# Patient Record
Sex: Female | Born: 1935 | Race: White | Hispanic: No | Smoking: Never smoker
Health system: Southern US, Community
[De-identification: ages and names within clinical notes are randomized; demographics above are authoritative.]

## PROBLEM LIST (undated history)

## (undated) DIAGNOSIS — M1990 Unspecified osteoarthritis, unspecified site: Secondary | ICD-10-CM

## (undated) DIAGNOSIS — K5790 Diverticulosis of intestine, part unspecified, without perforation or abscess without bleeding: Secondary | ICD-10-CM

## (undated) DIAGNOSIS — I10 Essential (primary) hypertension: Secondary | ICD-10-CM

## (undated) DIAGNOSIS — E119 Type 2 diabetes mellitus without complications: Secondary | ICD-10-CM

## (undated) DIAGNOSIS — J8410 Pulmonary fibrosis, unspecified: Secondary | ICD-10-CM

## (undated) HISTORY — DX: Unspecified osteoarthritis, unspecified site: M19.90

## (undated) HISTORY — DX: Diverticulosis of intestine, part unspecified, without perforation or abscess without bleeding: K57.90

## (undated) HISTORY — PX: HUMERUS FRACTURE SURGERY: SHX670

## (undated) HISTORY — PX: ABDOMINAL HYSTERECTOMY: SHX81

## (undated) HISTORY — PX: LAMINECTOMY: SHX219

## (undated) HISTORY — DX: Pulmonary fibrosis, unspecified: J84.10

## (undated) HISTORY — DX: Essential (primary) hypertension: I10

## (undated) HISTORY — DX: Type 2 diabetes mellitus without complications: E11.9

## (undated) HISTORY — PX: REPLACEMENT TOTAL KNEE: SUR1224

---

## 1994-05-19 ENCOUNTER — Encounter: Payer: Self-pay | Admitting: Internal Medicine

## 2002-05-16 DIAGNOSIS — J8410 Pulmonary fibrosis, unspecified: Secondary | ICD-10-CM | POA: Insufficient documentation

## 2004-04-19 ENCOUNTER — Ambulatory Visit: Payer: Self-pay | Admitting: Internal Medicine

## 2004-07-19 ENCOUNTER — Ambulatory Visit: Payer: Self-pay | Admitting: Internal Medicine

## 2004-08-26 ENCOUNTER — Ambulatory Visit: Payer: Self-pay | Admitting: Internal Medicine

## 2004-10-19 ENCOUNTER — Ambulatory Visit: Payer: Self-pay | Admitting: Internal Medicine

## 2004-12-21 ENCOUNTER — Ambulatory Visit: Payer: Self-pay | Admitting: Internal Medicine

## 2005-04-26 ENCOUNTER — Ambulatory Visit: Payer: Self-pay | Admitting: Internal Medicine

## 2005-08-25 ENCOUNTER — Ambulatory Visit: Payer: Self-pay | Admitting: Internal Medicine

## 2005-10-17 ENCOUNTER — Ambulatory Visit: Payer: Self-pay | Admitting: Family Medicine

## 2005-10-24 ENCOUNTER — Ambulatory Visit: Payer: Self-pay | Admitting: Internal Medicine

## 2005-11-30 ENCOUNTER — Inpatient Hospital Stay: Payer: Self-pay | Admitting: Specialist

## 2005-12-06 ENCOUNTER — Ambulatory Visit: Payer: Self-pay | Admitting: Internal Medicine

## 2005-12-21 ENCOUNTER — Ambulatory Visit: Payer: Self-pay | Admitting: Internal Medicine

## 2006-02-02 ENCOUNTER — Ambulatory Visit: Payer: Self-pay | Admitting: Internal Medicine

## 2006-06-06 ENCOUNTER — Ambulatory Visit: Payer: Self-pay | Admitting: Internal Medicine

## 2006-06-06 LAB — CONVERTED CEMR LAB
BUN: 33 mg/dL — ABNORMAL HIGH (ref 6–23)
CO2: 28 meq/L (ref 19–32)
Calcium: 9.5 mg/dL (ref 8.4–10.5)
Creatinine,U: 114 mg/dL
GFR calc Af Amer: 55 mL/min
Microalb, Ur: 4.6 mg/dL — ABNORMAL HIGH (ref 0.0–1.9)
Potassium: 4.2 meq/L (ref 3.5–5.1)

## 2006-09-12 ENCOUNTER — Encounter: Payer: Self-pay | Admitting: Internal Medicine

## 2006-10-11 DIAGNOSIS — K5730 Diverticulosis of large intestine without perforation or abscess without bleeding: Secondary | ICD-10-CM | POA: Insufficient documentation

## 2006-10-11 DIAGNOSIS — I10 Essential (primary) hypertension: Secondary | ICD-10-CM | POA: Insufficient documentation

## 2006-10-11 DIAGNOSIS — R32 Unspecified urinary incontinence: Secondary | ICD-10-CM | POA: Insufficient documentation

## 2006-10-12 ENCOUNTER — Ambulatory Visit: Payer: Self-pay | Admitting: Internal Medicine

## 2006-10-12 DIAGNOSIS — M1990 Unspecified osteoarthritis, unspecified site: Secondary | ICD-10-CM | POA: Insufficient documentation

## 2006-10-12 DIAGNOSIS — E1129 Type 2 diabetes mellitus with other diabetic kidney complication: Secondary | ICD-10-CM

## 2006-11-09 ENCOUNTER — Encounter: Payer: Self-pay | Admitting: Internal Medicine

## 2006-11-23 ENCOUNTER — Ambulatory Visit: Payer: Self-pay | Admitting: Internal Medicine

## 2006-11-23 LAB — CONVERTED CEMR LAB
Albumin: 3.8 g/dL (ref 3.5–5.2)
CO2: 29 meq/L (ref 19–32)
Creatinine, Ser: 1.3 mg/dL — ABNORMAL HIGH (ref 0.4–1.2)
Phosphorus: 4.3 mg/dL (ref 2.3–4.6)
Sodium: 138 meq/L (ref 135–145)

## 2007-01-05 ENCOUNTER — Ambulatory Visit: Payer: Self-pay | Admitting: Internal Medicine

## 2007-04-04 ENCOUNTER — Ambulatory Visit: Payer: Self-pay | Admitting: Family Medicine

## 2007-04-04 DIAGNOSIS — M79609 Pain in unspecified limb: Secondary | ICD-10-CM

## 2007-04-06 ENCOUNTER — Ambulatory Visit: Payer: Self-pay | Admitting: Internal Medicine

## 2007-04-16 ENCOUNTER — Telehealth: Payer: Self-pay | Admitting: Internal Medicine

## 2007-04-18 ENCOUNTER — Encounter: Payer: Self-pay | Admitting: Internal Medicine

## 2007-04-19 ENCOUNTER — Telehealth (INDEPENDENT_AMBULATORY_CARE_PROVIDER_SITE_OTHER): Payer: Self-pay | Admitting: *Deleted

## 2007-04-23 ENCOUNTER — Telehealth (INDEPENDENT_AMBULATORY_CARE_PROVIDER_SITE_OTHER): Payer: Self-pay | Admitting: *Deleted

## 2007-04-24 ENCOUNTER — Telehealth (INDEPENDENT_AMBULATORY_CARE_PROVIDER_SITE_OTHER): Payer: Self-pay | Admitting: *Deleted

## 2007-04-25 ENCOUNTER — Encounter: Payer: Self-pay | Admitting: Internal Medicine

## 2007-05-03 ENCOUNTER — Ambulatory Visit: Payer: Self-pay | Admitting: Internal Medicine

## 2007-05-03 DIAGNOSIS — M48061 Spinal stenosis, lumbar region without neurogenic claudication: Secondary | ICD-10-CM

## 2007-05-04 ENCOUNTER — Encounter: Payer: Self-pay | Admitting: Internal Medicine

## 2007-05-07 LAB — CONVERTED CEMR LAB
ALT: 25 units/L (ref 0–35)
AST: 28 units/L (ref 0–37)
Albumin: 3.9 g/dL (ref 3.5–5.2)
BUN: 33 mg/dL — ABNORMAL HIGH (ref 6–23)
Basophils Absolute: 0 10*3/uL (ref 0.0–0.1)
CO2: 28 meq/L (ref 19–32)
Chloride: 101 meq/L (ref 96–112)
Cholesterol: 168 mg/dL (ref 0–200)
Creatinine, Ser: 1.4 mg/dL — ABNORMAL HIGH (ref 0.4–1.2)
Creatinine,U: 181.3 mg/dL
HDL: 36.3 mg/dL — ABNORMAL LOW (ref >39.0)
Hgb A1c MFr Bld: 6.3 % — ABNORMAL HIGH (ref 4.6–6.0)
LDL Cholesterol: 95 mg/dL (ref 0–99)
MCHC: 35.1 g/dL (ref 30.0–36.0)
Microalb Creat Ratio: 25.4 mg/g (ref 0.0–30.0)
Monocytes Absolute: 0.5 10*3/uL (ref 0.2–0.7)
Monocytes Relative: 7.3 % (ref 3.0–11.0)
Phosphorus: 3.3 mg/dL (ref 2.3–4.6)
RBC: 3.67 M/uL — ABNORMAL LOW (ref 3.87–5.11)
RDW: 12.2 % (ref 11.5–14.6)
TSH: 2.07 microintl units/mL (ref 0.35–5.50)
Total CHOL/HDL Ratio: 4.6
Triglycerides: 185 mg/dL — ABNORMAL HIGH (ref 0–149)

## 2007-05-08 ENCOUNTER — Encounter (INDEPENDENT_AMBULATORY_CARE_PROVIDER_SITE_OTHER): Payer: Self-pay | Admitting: Internal Medicine

## 2007-05-09 ENCOUNTER — Encounter: Payer: Self-pay | Admitting: Internal Medicine

## 2007-05-14 ENCOUNTER — Ambulatory Visit: Payer: Self-pay | Admitting: Internal Medicine

## 2007-05-15 ENCOUNTER — Inpatient Hospital Stay (HOSPITAL_COMMUNITY): Admission: RE | Admit: 2007-05-15 | Discharge: 2007-05-18 | Payer: Self-pay | Admitting: Neurosurgery

## 2007-05-16 ENCOUNTER — Encounter (INDEPENDENT_AMBULATORY_CARE_PROVIDER_SITE_OTHER): Payer: Self-pay | Admitting: Neurosurgery

## 2007-05-16 ENCOUNTER — Ambulatory Visit: Payer: Self-pay | Admitting: Vascular Surgery

## 2007-05-25 ENCOUNTER — Encounter: Payer: Self-pay | Admitting: Internal Medicine

## 2007-07-19 ENCOUNTER — Ambulatory Visit: Payer: Self-pay | Admitting: Family Medicine

## 2007-08-09 ENCOUNTER — Ambulatory Visit: Payer: Self-pay | Admitting: Internal Medicine

## 2007-08-09 ENCOUNTER — Telehealth (INDEPENDENT_AMBULATORY_CARE_PROVIDER_SITE_OTHER): Payer: Self-pay | Admitting: *Deleted

## 2007-08-17 ENCOUNTER — Ambulatory Visit: Payer: Self-pay | Admitting: Internal Medicine

## 2007-08-21 ENCOUNTER — Ambulatory Visit: Payer: Self-pay | Admitting: Pulmonary Disease

## 2007-08-21 ENCOUNTER — Ambulatory Visit: Payer: Self-pay | Admitting: Cardiology

## 2007-09-06 ENCOUNTER — Ambulatory Visit: Payer: Self-pay | Admitting: Internal Medicine

## 2007-09-11 ENCOUNTER — Ambulatory Visit: Payer: Self-pay | Admitting: Pulmonary Disease

## 2007-09-13 ENCOUNTER — Telehealth: Payer: Self-pay | Admitting: Pulmonary Disease

## 2007-11-07 ENCOUNTER — Encounter: Payer: Self-pay | Admitting: Internal Medicine

## 2007-11-29 ENCOUNTER — Encounter: Payer: Self-pay | Admitting: Internal Medicine

## 2007-12-10 ENCOUNTER — Ambulatory Visit: Payer: Self-pay | Admitting: Pulmonary Disease

## 2007-12-21 ENCOUNTER — Ambulatory Visit: Payer: Self-pay | Admitting: Internal Medicine

## 2007-12-21 DIAGNOSIS — R011 Cardiac murmur, unspecified: Secondary | ICD-10-CM

## 2007-12-25 LAB — CONVERTED CEMR LAB
ALT: 18 U/L
AST: 23 U/L
Albumin: 3.8 g/dL
Alkaline Phosphatase: 68 U/L
BUN: 38 mg/dL — ABNORMAL HIGH
Basophils Absolute: 0 K/uL
Basophils Relative: 0.6 %
Bilirubin, Direct: 0.1 mg/dL
CO2: 30 meq/L
Calcium: 9.5 mg/dL
Chloride: 105 meq/L
Creatinine, Ser: 1.2 mg/dL
Creatinine,U: 105 mg/dL
Eosinophils Absolute: 0.2 K/uL
Eosinophils Relative: 3.2 %
GFR calc Af Amer: 55 mL/min
GFR calc non Af Amer: 46 mL/min
Glucose, Bld: 111 mg/dL — ABNORMAL HIGH
HCT: 33.3 % — ABNORMAL LOW
Hemoglobin: 11.2 g/dL — ABNORMAL LOW
Hgb A1c MFr Bld: 6.7 % — ABNORMAL HIGH
Lymphocytes Relative: 19.1 %
MCHC: 33.8 g/dL
MCV: 88 fL
Microalb Creat Ratio: 8.6 mg/g
Microalb, Ur: 0.9 mg/dL
Monocytes Absolute: 0.5 K/uL
Monocytes Relative: 8 %
Neutro Abs: 4.3 K/uL
Neutrophils Relative %: 69.1 %
Phosphorus: 3.9 mg/dL
Platelets: 225 K/uL
Potassium: 4.8 meq/L
RBC: 3.78 M/uL — ABNORMAL LOW
RDW: 13.3 %
Sodium: 140 meq/L
TSH: 2.58 u[IU]/mL
Total Bilirubin: 0.6 mg/dL
Total Protein: 7 g/dL
WBC: 6.2 10*3/microliter

## 2007-12-31 ENCOUNTER — Telehealth: Payer: Self-pay | Admitting: Internal Medicine

## 2008-02-08 ENCOUNTER — Encounter: Payer: Self-pay | Admitting: Internal Medicine

## 2008-03-20 ENCOUNTER — Telehealth: Payer: Self-pay | Admitting: Internal Medicine

## 2008-04-14 ENCOUNTER — Ambulatory Visit: Payer: Self-pay | Admitting: Cardiovascular Disease

## 2008-04-16 ENCOUNTER — Ambulatory Visit: Payer: Self-pay | Admitting: Pulmonary Disease

## 2008-04-28 ENCOUNTER — Telehealth: Payer: Self-pay | Admitting: Internal Medicine

## 2008-05-15 ENCOUNTER — Telehealth: Payer: Self-pay | Admitting: Pulmonary Disease

## 2008-05-20 ENCOUNTER — Telehealth (INDEPENDENT_AMBULATORY_CARE_PROVIDER_SITE_OTHER): Payer: Self-pay | Admitting: *Deleted

## 2008-06-16 ENCOUNTER — Ambulatory Visit: Payer: Self-pay | Admitting: Pulmonary Disease

## 2008-06-26 ENCOUNTER — Ambulatory Visit: Payer: Self-pay | Admitting: Internal Medicine

## 2008-06-30 ENCOUNTER — Telehealth: Payer: Self-pay | Admitting: Internal Medicine

## 2008-06-30 ENCOUNTER — Telehealth: Payer: Self-pay | Admitting: Pulmonary Disease

## 2008-06-30 LAB — CONVERTED CEMR LAB
Basophils Absolute: 0.2 10*3/uL — ABNORMAL HIGH (ref 0.0–0.1)
Basophils Relative: 1.6 % (ref 0.0–3.0)
CO2: 29 meq/L (ref 19–32)
Chloride: 103 meq/L (ref 96–112)
Creatinine, Ser: 1.3 mg/dL — ABNORMAL HIGH (ref 0.4–1.2)
Eosinophils Absolute: 0.1 10*3/uL (ref 0.0–0.7)
Eosinophils Relative: 0.4 % (ref 0.0–5.0)
GFR calc Af Amer: 50 mL/min
Hgb A1c MFr Bld: 6.6 % — ABNORMAL HIGH (ref 4.6–6.0)
Lymphocytes Relative: 8.3 % — ABNORMAL LOW (ref 12.0–46.0)
MCHC: 33.4 g/dL (ref 30.0–36.0)
MCV: 92.1 fL (ref 78.0–100.0)
Neutrophils Relative %: 86.7 % — ABNORMAL HIGH (ref 43.0–77.0)
Phosphorus: 4 mg/dL (ref 2.3–4.6)
Platelets: 251 10*3/uL (ref 150–400)
Potassium: 5.4 meq/L — ABNORMAL HIGH (ref 3.5–5.1)
RBC: 3.99 M/uL (ref 3.87–5.11)
TSH: 1.73 microintl units/mL (ref 0.35–5.50)
WBC: 12.8 10*3/uL — ABNORMAL HIGH (ref 4.5–10.5)

## 2008-07-22 ENCOUNTER — Ambulatory Visit: Payer: Self-pay | Admitting: Internal Medicine

## 2008-07-22 ENCOUNTER — Telehealth: Payer: Self-pay | Admitting: Internal Medicine

## 2008-07-24 LAB — CONVERTED CEMR LAB
BUN: 32 mg/dL — ABNORMAL HIGH (ref 6–23)
CO2: 29 meq/L (ref 19–32)
Calcium: 9 mg/dL (ref 8.4–10.5)
Chloride: 101 meq/L (ref 96–112)
Eosinophils Relative: 1.4 % (ref 0.0–5.0)
GFR calc non Af Amer: 38 mL/min
Glucose, Bld: 189 mg/dL — ABNORMAL HIGH (ref 70–99)
HCT: 33.9 % — ABNORMAL LOW (ref 36.0–46.0)
Lymphocytes Relative: 28.4 % (ref 12.0–46.0)
Monocytes Relative: 7 % (ref 3.0–12.0)
Neutrophils Relative %: 62.4 % (ref 43.0–77.0)
Platelets: 239 10*3/uL (ref 150–400)
Potassium: 4.2 meq/L (ref 3.5–5.1)
RDW: 14.7 % — ABNORMAL HIGH (ref 11.5–14.6)
Sodium: 139 meq/L (ref 135–145)
WBC: 9.7 10*3/uL (ref 4.5–10.5)

## 2008-07-29 ENCOUNTER — Ambulatory Visit: Payer: Self-pay | Admitting: Pulmonary Disease

## 2008-08-22 ENCOUNTER — Ambulatory Visit: Payer: Self-pay | Admitting: Gastroenterology

## 2008-09-18 ENCOUNTER — Telehealth: Payer: Self-pay | Admitting: Gastroenterology

## 2008-09-18 ENCOUNTER — Ambulatory Visit: Payer: Self-pay | Admitting: Pulmonary Disease

## 2008-09-26 ENCOUNTER — Telehealth: Payer: Self-pay | Admitting: Gastroenterology

## 2008-10-03 ENCOUNTER — Encounter: Payer: Self-pay | Admitting: Gastroenterology

## 2008-10-03 ENCOUNTER — Ambulatory Visit: Payer: Self-pay | Admitting: Gastroenterology

## 2008-10-03 LAB — HM COLONOSCOPY

## 2008-10-07 ENCOUNTER — Encounter: Payer: Self-pay | Admitting: Gastroenterology

## 2008-11-07 ENCOUNTER — Encounter: Payer: Self-pay | Admitting: Internal Medicine

## 2008-11-27 ENCOUNTER — Encounter: Payer: Self-pay | Admitting: Internal Medicine

## 2008-12-05 ENCOUNTER — Telehealth: Payer: Self-pay | Admitting: Pulmonary Disease

## 2008-12-09 ENCOUNTER — Ambulatory Visit: Payer: Self-pay | Admitting: Internal Medicine

## 2008-12-09 ENCOUNTER — Telehealth: Payer: Self-pay | Admitting: Internal Medicine

## 2008-12-10 LAB — CONVERTED CEMR LAB
ALT: 16 units/L (ref 0–35)
AST: 20 units/L (ref 0–37)
BUN: 31 mg/dL — ABNORMAL HIGH (ref 6–23)
Basophils Relative: 0.5 % (ref 0.0–3.0)
Bilirubin, Direct: 0 mg/dL (ref 0.0–0.3)
CO2: 29 meq/L (ref 19–32)
Chloride: 104 meq/L (ref 96–112)
Eosinophils Relative: 1.6 % (ref 0.0–5.0)
HCT: 32.5 % — ABNORMAL LOW (ref 36.0–46.0)
Hgb A1c MFr Bld: 7.9 % — ABNORMAL HIGH (ref 4.6–6.5)
Lymphs Abs: 1.6 10*3/uL (ref 0.7–4.0)
Monocytes Relative: 3.4 % (ref 3.0–12.0)
Phosphorus: 3.5 mg/dL (ref 2.3–4.6)
Platelets: 260 10*3/uL (ref 150.0–400.0)
Potassium: 4 meq/L (ref 3.5–5.1)
RBC: 3.51 M/uL — ABNORMAL LOW (ref 3.87–5.11)
Sodium: 142 meq/L (ref 135–145)
Total Bilirubin: 0.6 mg/dL (ref 0.3–1.2)
Total Protein: 6.5 g/dL (ref 6.0–8.3)
WBC: 14.2 10*3/uL — ABNORMAL HIGH (ref 4.5–10.5)

## 2008-12-11 ENCOUNTER — Telehealth (INDEPENDENT_AMBULATORY_CARE_PROVIDER_SITE_OTHER): Payer: Self-pay | Admitting: *Deleted

## 2009-01-08 ENCOUNTER — Ambulatory Visit: Payer: Self-pay | Admitting: Pulmonary Disease

## 2009-01-09 ENCOUNTER — Telehealth: Payer: Self-pay | Admitting: Pulmonary Disease

## 2009-02-17 ENCOUNTER — Ambulatory Visit: Payer: Self-pay | Admitting: Pulmonary Disease

## 2009-05-07 ENCOUNTER — Ambulatory Visit: Payer: Self-pay | Admitting: Pulmonary Disease

## 2009-05-14 ENCOUNTER — Ambulatory Visit: Payer: Self-pay | Admitting: Internal Medicine

## 2009-05-14 DIAGNOSIS — F419 Anxiety disorder, unspecified: Secondary | ICD-10-CM | POA: Insufficient documentation

## 2009-05-19 LAB — CONVERTED CEMR LAB
AST: 18 units/L (ref 0–37)
Albumin: 4.2 g/dL (ref 3.5–5.2)
Alkaline Phosphatase: 48 units/L (ref 39–117)
Basophils Absolute: 0 10*3/uL (ref 0.0–0.1)
Lymphocytes Relative: 12 % (ref 12–46)
Neutro Abs: 8.2 10*3/uL — ABNORMAL HIGH (ref 1.7–7.7)
Neutrophils Relative %: 84 % — ABNORMAL HIGH (ref 43–77)
Platelets: 257 10*3/uL (ref 150–400)
Potassium: 5 meq/L (ref 3.5–5.3)
RDW: 14.5 % (ref 11.5–15.5)
Sodium: 140 meq/L (ref 135–145)
Total Bilirubin: 0.4 mg/dL (ref 0.3–1.2)
Total Protein: 6.8 g/dL (ref 6.0–8.3)

## 2009-07-20 ENCOUNTER — Telehealth: Payer: Self-pay | Admitting: Internal Medicine

## 2009-07-22 ENCOUNTER — Ambulatory Visit: Payer: Self-pay | Admitting: Family Medicine

## 2009-07-23 ENCOUNTER — Telehealth: Payer: Self-pay | Admitting: Family Medicine

## 2009-07-23 ENCOUNTER — Ambulatory Visit: Payer: Self-pay | Admitting: Family Medicine

## 2009-07-23 LAB — CONVERTED CEMR LAB
Albumin: 3.2 g/dL — ABNORMAL LOW (ref 3.5–5.2)
Alkaline Phosphatase: 72 units/L (ref 39–117)
Basophils Absolute: 0.1 10*3/uL (ref 0.0–0.1)
Basophils Relative: 0.6 % (ref 0.0–3.0)
Bilirubin Urine: NEGATIVE
CO2: 31 meq/L (ref 19–32)
Calcium: 9.7 mg/dL (ref 8.4–10.5)
Chloride: 103 meq/L (ref 96–112)
Creatinine, Ser: 1.5 mg/dL — ABNORMAL HIGH (ref 0.4–1.2)
Eosinophils Absolute: 0.2 10*3/uL (ref 0.0–0.7)
Glucose, Bld: 125 mg/dL — ABNORMAL HIGH (ref 70–99)
Glucose, Urine, Semiquant: NEGATIVE
Hemoglobin: 10.3 g/dL — ABNORMAL LOW (ref 12.0–15.0)
Ketones, urine, test strip: NEGATIVE
Lymphocytes Relative: 12.7 % (ref 12.0–46.0)
MCHC: 33 g/dL (ref 30.0–36.0)
MCV: 93.9 fL (ref 78.0–100.0)
Monocytes Absolute: 0.9 10*3/uL (ref 0.1–1.0)
Neutro Abs: 7.4 10*3/uL (ref 1.4–7.7)
RDW: 12.7 % (ref 11.5–14.6)
Sodium: 141 meq/L (ref 135–145)
Specific Gravity, Urine: 1.015
Total Protein: 6.9 g/dL (ref 6.0–8.3)
pH: 5

## 2009-07-27 ENCOUNTER — Telehealth: Payer: Self-pay | Admitting: Family Medicine

## 2009-08-04 ENCOUNTER — Ambulatory Visit: Payer: Self-pay | Admitting: Internal Medicine

## 2009-08-04 LAB — CONVERTED CEMR LAB
Bilirubin Urine: NEGATIVE
Glucose, Urine, Semiquant: NEGATIVE
Ketones, urine, test strip: NEGATIVE
Protein, U semiquant: 30
Urobilinogen, UA: 0.2
pH: 7

## 2009-08-05 ENCOUNTER — Ambulatory Visit: Payer: Self-pay | Admitting: Pulmonary Disease

## 2009-08-05 ENCOUNTER — Encounter: Payer: Self-pay | Admitting: Pulmonary Disease

## 2009-09-29 ENCOUNTER — Encounter: Payer: Self-pay | Admitting: Internal Medicine

## 2009-09-29 ENCOUNTER — Telehealth (INDEPENDENT_AMBULATORY_CARE_PROVIDER_SITE_OTHER): Payer: Self-pay | Admitting: *Deleted

## 2009-10-23 ENCOUNTER — Ambulatory Visit: Payer: Self-pay | Admitting: Internal Medicine

## 2009-10-26 LAB — CONVERTED CEMR LAB
ALT: 27 units/L (ref 0–35)
AST: 33 units/L (ref 0–37)
Basophils Relative: 0.3 % (ref 0.0–3.0)
Bilirubin, Direct: 0.1 mg/dL (ref 0.0–0.3)
CO2: 28 meq/L (ref 19–32)
Calcium: 9.2 mg/dL (ref 8.4–10.5)
Creatinine, Ser: 1.3 mg/dL — ABNORMAL HIGH (ref 0.4–1.2)
Eosinophils Relative: 0 % (ref 0.0–5.0)
GFR calc non Af Amer: 42.89 mL/min (ref >60)
Glucose, Bld: 110 mg/dL — ABNORMAL HIGH (ref 70–99)
HCT: 29.8 % — ABNORMAL LOW (ref 36.0–46.0)
Monocytes Relative: 5.8 % (ref 3.0–12.0)
Neutrophils Relative %: 33.4 % — ABNORMAL LOW (ref 43.0–77.0)
Platelets: 141 10*3/uL — ABNORMAL LOW (ref 150.0–400.0)
Potassium: 5 meq/L (ref 3.5–5.1)
RBC: 3.41 M/uL — ABNORMAL LOW (ref 3.87–5.11)
Sodium: 143 meq/L (ref 135–145)
Total Bilirubin: 0.3 mg/dL (ref 0.3–1.2)
Total Protein: 6.8 g/dL (ref 6.0–8.3)
WBC: 6.1 10*3/uL (ref 4.5–10.5)

## 2009-10-28 ENCOUNTER — Encounter: Payer: Self-pay | Admitting: Internal Medicine

## 2009-11-04 ENCOUNTER — Ambulatory Visit: Payer: Self-pay | Admitting: Pulmonary Disease

## 2009-11-19 ENCOUNTER — Encounter: Payer: Self-pay | Admitting: Internal Medicine

## 2009-11-19 LAB — HM DIABETES EYE EXAM

## 2009-12-02 ENCOUNTER — Telehealth: Payer: Self-pay | Admitting: Internal Medicine

## 2009-12-11 ENCOUNTER — Telehealth (INDEPENDENT_AMBULATORY_CARE_PROVIDER_SITE_OTHER): Payer: Self-pay | Admitting: *Deleted

## 2010-01-27 ENCOUNTER — Telehealth: Payer: Self-pay | Admitting: Internal Medicine

## 2010-01-27 ENCOUNTER — Encounter: Payer: Self-pay | Admitting: Internal Medicine

## 2010-03-01 ENCOUNTER — Ambulatory Visit: Payer: Self-pay | Admitting: Pulmonary Disease

## 2010-04-19 ENCOUNTER — Ambulatory Visit: Payer: Self-pay | Admitting: Internal Medicine

## 2010-04-19 LAB — HM DIABETES FOOT EXAM

## 2010-04-23 LAB — CONVERTED CEMR LAB
Basophils Relative: 0.6 % (ref 0.0–3.0)
CO2: 28 meq/L (ref 19–32)
Calcium: 9.6 mg/dL (ref 8.4–10.5)
Chloride: 101 meq/L (ref 96–112)
Eosinophils Absolute: 0.3 10*3/uL (ref 0.0–0.7)
Eosinophils Relative: 4.3 % (ref 0.0–5.0)
GFR calc non Af Amer: 43.64 mL/min — ABNORMAL LOW (ref >60.00)
Glucose, Bld: 176 mg/dL — ABNORMAL HIGH (ref 70–99)
Lymphocytes Relative: 20.6 % (ref 12.0–46.0)
Neutrophils Relative %: 68.9 % (ref 43.0–77.0)
Phosphorus: 3.3 mg/dL (ref 2.3–4.6)
Platelets: 243 10*3/uL (ref 150.0–400.0)
RBC: 3.91 M/uL (ref 3.87–5.11)
WBC: 7.8 10*3/uL (ref 4.5–10.5)

## 2010-05-03 ENCOUNTER — Ambulatory Visit: Payer: Self-pay | Admitting: Pulmonary Disease

## 2010-06-15 NOTE — Progress Notes (Signed)
Summary: form for diabetic supplies  Phone Note From Pharmacy   Caller: [employer] Summary of Call: Form for diabetic supplies is on your desk. Initial call taken by: Lowella Petties CMA,  January 27, 2010 12:33 PM  Follow-up for Phone Call        form done Follow-up by: Cindee Salt MD,  January 27, 2010 2:02 PM  Additional Follow-up for Phone Call Additional follow up Details #1::        form faxed back and scanned Additional Follow-up by: Mervin Hack CMA Duncan Dull),  January 27, 2010 2:25 PM

## 2010-06-15 NOTE — Progress Notes (Signed)
Summary: ok to decrease prednisone 5mg  to twice weekly  Phone Note Call from Patient Call back at Home Phone [phone]   Caller: Patient Call For: alva Reason for Call: Talk to Nurse Summary of Call: pt has been on 3 prednisone tabs per day since June.  She wants to know if she can taper down to 2 per day since she is feeling a whole lot better?  Can she get a call back today?  Leave a message if you can't get back today. Initial call taken by: Eugene Gavia,  December 11, 2009 1:54 PM  Follow-up for Phone Call        Called, spoke with pt.  Pt states cough and breathing have improved.  Currently taking prednsione 5mg  three times a week - would like to know if she can decrease this to twice weekly.   Please call pt back on daughter's cell at [phone] -- she will be out of town on Monday when RA returns. Will forward message to RA - pls advise if this would be ok.  Thanks!  Follow-up by: Gweneth Dimitri RN,  December 11, 2009 2:20 PM  Additional Follow-up for Phone Call Additional follow up Details #1::        OK Additional Follow-up by: Comer Locket. Vassie Loll MD,  December 11, 2009 7:25 PM    Additional Follow-up for Phone Call Additional follow up Details #2::    called spoke with patient's daughter, Karlene Einstein.  advised of RA's ok to decrease the prednisone 5mg  from three times a week to twice a week.  Lynden Ang verbalized her understanding. Boone Master CNA/MA  December 14, 2009 9:04 AM

## 2010-06-15 NOTE — Miscellaneous (Signed)
Summary: Orders Update  Clinical Lists Changes  Orders: Added new Test order of T-2 View CXR (71020TC) - Signed 

## 2010-06-15 NOTE — Assessment & Plan Note (Signed)
Summary: rov 4 months///kp   Primary Provider/Referring Provider:  Tillman Abide MD  CC:  4 mth rov per Dr Vassie Loll - SOB with increased activity - Denies cough or chest congestion - Occas wheezing - Needs refill on Prednisone - Wants flu shot today.  History of Present Illness: 87/F, never smoker for FU of pulmonary fibrosis, on empiric steroids since 2/10 with ceiling of 20 mg & floor 5 mg  2/10 started pred, increased to 20 mg 3/10 Cough gone, sugars OK, no wt gain or edema, dyspnea better 5/10 gained 2 lbs, no cough, able to exercise in the pool, sugars OK,no edema January 08, 2009  Dropped prednisone to 10 mg in 7/10 & 5 mg in 8/10. Now c/o dry cough x 2 weeks & increasing dyspnea esp on climbing steps. Easy bruising prior to taking steroids. .  November 04, 2009 2:09 PM  chest cold towards end of may, now better. breathing is the same.  States cough returned but since she has started benzonatate and increased prednisone and cough seems to be getting better.  Cough is nonprod now  March 01, 2010 --Presents for 4 month follow up. She would like flu shot today. Breathing has been at baseline, still gets winded with activity. No increased cough or congestion. Has been on a very slow steroid taper, currently on 5mg  twice weekly. She does need refill on steroid.  With steroid decrease from last ov 4 months ago, did not see increase in cough or dyspnea. Denies chest pain, orthopnea, hemoptysis, fever, n/v/d, edema, headache   Current Medications (verified): 1)  Benzonatate 200 Mg Caps (Benzonatate) .... Take 1 Tablet By Mouth Three Times A Day As Needed 2)  Benicar Hct 40-25 Mg Tabs (Olmesartan Medoxomil-Hctz) .Marland Kitchen.. 1daily 3)  Xanax 0.25 Mg Tabs (Alprazolam) .Marland Kitchen.. 1 Two Times A Day As Needed For Nerves 4)  Metformin Hcl 500 Mg Tabs (Metformin Hcl) .Marland Kitchen.. 1 Tab Two Times A Day Before Breakfast and Supper 5)  Cartia Xt 120 Mg Cp24 (Diltiazem Hcl Coated Beads) .Marland Kitchen.. 1 Daily 6)  Nadolol 80 Mg  Tabs  (Nadolol) .... Take 1 Tablet By Mouth Once A Day 7)  Meloxicam 7.5 Mg  Tabs (Meloxicam) .... Take One Tablet Every Other Day-Monday, Wednesday, Friday 8)  Terazosin Hcl 5 Mg  Caps (Terazosin Hcl) .Marland Kitchen.. 1 Daily 9)  Premarin 0.625 Mg/gm  Crea (Estrogens, Conjugated) .... 2 Times A Week 10)  Ketoconazole 2 %  Crea (Ketoconazole) .... Apply Two Times A Day To Rash As Needed 11)  Calcium 600-200 Mg-Unit Tabs (Calcium-Vitamin D) .... Take 1 Tablet By Mouth Two Times A Day 12)  Prednisone 5 Mg Tabs (Prednisone) .... Take 1 By Mouth Once Daily Then Jun 16, 2009 Take 1 By Mouth Tuesday, Thursday 13)  Systane 0.4-0.3 % Soln (Polyethyl Glycol-Propyl Glycol) .... Instill 2 Drops in Each Eye At Night 14)  Aspirin 81 Mg Tbec (Aspirin) .... Take 1 By Mouth Once Daily 15)  Centrum Silver   Tabs (Multiple Vitamins-Minerals) .... Take 1 Tablet By Mouth Once A Day 16)  Fish Oil 1200 Mg Caps (Omega-3 Fatty Acids) .... Take 1 Tablet By Mouth Once A Day 17)  Stool Softener 100 Mg Caps (Docusate Sodium) .... As Needed 18)  Cranberry .... Once Daily  Allergies (verified): 1)  Penicillin 2)  Sulfa 3)  * Lisinopril  Comments:  Nurse/Medical Assistant: The patient's medications and allergies were reviewed with the patient and were updated in the Medication and Allergy Lists.  Past  History:  Past Medical History: Last updated: 05/14/2009 Diverticulosis, colon Hypertension Urinary incontinence Lung fibrosis noted 2004 Osteoarthritis Lumbar spinal stenosis Diabetes mellitus, type II Anxiety  Past Surgical History: Last updated: 07/19/2007 DEXA normal  10/2000 partial colectomy 1992 Hysterectomy 1972 Fx humerus  repair  2001 Eyelid repair 1990 L  total knee 11/2005  L3-4, L4-5 decompressive laminectomy--12/08--[facility]  Family History: Last updated: Oct 22, 2006 Dad died @88  colon cancer, HTN, DM Mom died@87  Altzheimers, depression 1 sister CAD?? in Dad    Social History: Last updated:  10-22-06 Widowed--lives with daughter [name]) Has 2 sons in [place] Never Smoked Alcohol use-yes--occ  Past Pulmonary History:  Pulmonary History: PFTs 7/09 >>FVC 59% - mod restriction with desaturation on exertion. BLL interstitial infiltrates not typical of IPF.  ANA neg, RA factor neg - esr 71  Pneumovax 2003  11/09 CT chest >>Pulmonary parenchymal pattern of fibrosis is most consistent with usual interstitial pneumonia (UIP).  Increase in superimposed ground-glass suggests areas of active inflammation. Destaurates on exertion  Review of Systems      See HPI  Vital Signs:  Patient profile:   77 year old female Weight:      153.50 pounds O2 Sat:      92 % on Room air Temp:     97 degrees F oral Pulse rate:   74 / minute BP sitting:   132 / 60  (left arm) Cuff size:   regular  Vitals Entered By: Abigail Miyamoto RN (March 01, 2010 2:36 PM)  O2 Flow:  Room air  Physical Exam  Additional Exam:  Gen. Pleasant , elderly,  ENT - no lesions, no post nasal drip Neck: No JVD, no thyromegaly, no carotid bruits Lungs: no use of accessory muscles,bibasilar crackles  Cardiovascular: Rhythm regular, heart sounds  normal, no murmurs or gallops, no peripheral edema Musculoskeletal: No deformities, no cyanosis or clubbing  Skin: intact, no rash Neuro: no focal deficits noted.      Impression & Recommendations:  Problem # 1:  PULMONARY FIBROSIS, CHRONIC (ICD-515)  Decrease Prednisone 2.5mg  twice weekly-Tuesday and Friday  Flu shot today.  follow up Dr. Vassie Loll 2 months and as needed   Her updated medication list for this problem includes:    Xanax 0.25 Mg Tabs (Alprazolam) .Marland Kitchen... 1 two times a day as needed for nerves  Orders: Est. Patient Level III (14431)  Medications Added to Medication List This Visit: 1)  Prednisone 5 Mg Tabs (Prednisone) .... Take 1 by mouth once daily then Jun 16, 2009 take 1 by mouth tuesday, thursday 2)  Prednisone 2.5 Mg Tabs (Prednisone)  .Marland Kitchen.. 1 by mouth twice weekly  Complete Medication List: 1)  Benicar Hct 40-25 Mg Tabs (Olmesartan medoxomil-hctz) .Marland Kitchen.. 1daily 2)  Metformin Hcl 500 Mg Tabs (Metformin hcl) .Marland Kitchen.. 1 tab two times a day before breakfast and supper 3)  Cartia Xt 120 Mg Cp24 (Diltiazem hcl coated beads) .Marland Kitchen.. 1 daily 4)  Nadolol 80 Mg Tabs (Nadolol) .... Take 1 tablet by mouth once a day 5)  Meloxicam 7.5 Mg Tabs (Meloxicam) .... Take one tablet every other day-monday, wednesday, friday 6)  Terazosin Hcl 5 Mg Caps (Terazosin hcl) .Marland Kitchen.. 1 daily 7)  Systane 0.4-0.3 % Soln (Polyethyl glycol-propyl glycol) .... Instill 2 drops in each eye at night 8)  Aspirin 81 Mg Tbec (Aspirin) .... Take 1 by mouth once daily 9)  Prednisone 2.5 Mg Tabs (Prednisone) .Marland Kitchen.. 1 by mouth twice weekly 10)  Centrum Silver Tabs (Multiple vitamins-minerals) .... Take 1  tablet by mouth once a day 11)  Premarin 0.625 Mg/gm Crea (Estrogens, conjugated) .... 2 times a week 12)  Calcium 600-200 Mg-unit Tabs (Calcium-vitamin d) .... Take 1 tablet by mouth two times a day 13)  Ketoconazole 2 % Crea (Ketoconazole) .... Apply two times a day to rash as needed 14)  Fish Oil 1200 Mg Caps (Omega-3 fatty acids) .... Take 1 tablet by mouth once a day 15)  Stool Softener 100 Mg Caps (Docusate sodium) .... As needed 16)  Cranberry  .... Once daily 17)  Benzonatate 200 Mg Caps (Benzonatate) .... Take 1 tablet by mouth three times a day as needed 18)  Xanax 0.25 Mg Tabs (Alprazolam) .Marland Kitchen.. 1 two times a day as needed for nerves  Other Orders: Flu Vaccine 30yrs + MEDICARE PATIENTS (N8295) Administration Flu vaccine - MCR (A2130)  Patient Instructions: 1)  Decrease Prednisone 2.5mg  twice weekly-Tuesday and Friday  2)  Flu shot today.  3)  follow up Dr. Vassie Loll 2 months and as needed  Prescriptions: PREDNISONE 2.5 MG TABS (PREDNISONE) 1 by mouth twice weekly  #10 x 1   Entered and Authorized by:   Rubye Oaks NP   Signed by:   Rubye Oaks NP on 03/01/2010    Method used:   Elect[facility]ically to        [facility] [phone]* (retail)       [address] [place], [place]  [address]       Ph: [phone] or [phone]       Fax: [phone]   RxID:   [id number]  Flu Vaccine Consent Questions     Do you have a history of severe allergic reactions to this vaccine? no    Any prior history of allergic reactions to egg and/or gelatin? no    Do you have a sensitivity to the preservative Thimersol? no    Do you have a past history of Guillan-Barre Syndrome? no    Do you currently have an acute febrile illness? no    Have you ever had a severe reaction to latex? no    Vaccine information given and explained to patient? yes    Are you currently pregnant? no    Lot Number:[id number]   Exp Date:11/13/2010   Site Given  RIGHT  Deltoid IMdflu   Randell Loop [facility]  March 01, 2010 3:19 PM

## 2010-06-15 NOTE — Assessment & Plan Note (Signed)
Summary: 6 MONTH FOLLOW UP/[facility]   Vital Signs:  Patient profile:   77 year old female Weight:      151 pounds Temp:     98.4 degrees F oral Pulse rate:   72 / minute Pulse rhythm:   regular BP sitting:   158 / 62  (left arm) Cuff size:   regular  Vitals Entered By: Mervin Hack CMA Duncan Dull) (April 19, 2010 11:02 AM) CC: 6 month follow-up, Hypertension Management   History of Present Illness: Doing okay Recent pulmonary eval--has weaned prednisone to 2 times per week Breathing feels okay Not coughing much  has cut back on aqua-cise Tries to walk some  checks sugars several times per week All under 120 No hypoglycemic   No chest pain No palpitations Stable DOE  some back pain--only intermittent Only occ taking meloxicam every other day---daughter concerned about it will change to as needed and regular tylenol  Hypertension History:      Positive major cardiovascular risk factors include female age 87 years old or older, diabetes, and hypertension.  Negative major cardiovascular risk factors include non-tobacco-user status.     Allergies: 1)  Penicillin 2)  Sulfa 3)  * Lisinopril  Past History:  Past medical, surgical, family and social histories (including risk factors) reviewed for relevance to current acute and chronic problems.  Past Medical History: Reviewed history from 05/14/2009 and no changes required. Diverticulosis, colon Hypertension Urinary incontinence Lung fibrosis noted 2004 Osteoarthritis Lumbar spinal stenosis Diabetes mellitus, type II Anxiety  Past Surgical History: Reviewed history from 07/19/2007 and no changes required. DEXA normal  10/2000 partial colectomy 1992 Hysterectomy 1972 Fx humerus  repair  2001 Eyelid repair 1990 L  total knee 11/2005  L3-4, L4-5 decompressive laminectomy--12/08--[facility]  Family History: Reviewed history from 10/11/2006 and no changes required. Dad died @88  colon cancer, HTN, DM Mom died@87   Altzheimers, depression 1 sister CAD?? in Dad    Social History: Reviewed history from 10/11/2006 and no changes required. Widowed--lives with daughter [name]) Has 2 sons in [place] Never Smoked Alcohol use-yes--occ  Review of Systems       sleeps okay but gets up twice to void appetite off but still eats weight fairly stable  Physical Exam  General:  alert and normal appearance.   Neck:  supple, no masses, no thyromegaly, and no cervical lymphadenopathy.   Lungs:  normal respiratory effort, no intercostal retractions, and no accessory muscle use.  Very faint left basilar crackles Heart:  normal rate, regular rhythm, and no gallop.   Gr 3/6 systolic murmur Abdomen:  soft and non-tender.   Pulses:  trace pulses Extremities:  no sig edema Skin:  no suspicious lesions and no ulcerations.   Psych:  normally interactive, good eye contact, not anxious appearing, and not depressed appearing.    Diabetes Management Exam:    Foot Exam (with socks and/or shoes not present):       Sensory-Pinprick/Light touch:          Left medial foot (L-4): diminished          Left dorsal foot (L-5): diminished          Left lateral foot (S-1): diminished          Right medial foot (L-4): diminished          Right dorsal foot (L-5): diminished          Right lateral foot (S-1): diminished       Inspection:  Left foot: normal          Right foot: normal   Impression & Recommendations:  Problem # 1:  DIABETES MELLITUS, TYPE II, WITH RENAL COMPLICATIONS (ICD-250.40) Assessment Unchanged  has good control will recheck A1c  Her updated medication list for this problem includes:    Benicar Hct 40-25 Mg Tabs (Olmesartan medoxomil-hctz) .Marland Kitchen... 1daily    Metformin Hcl 500 Mg Tabs (Metformin hcl) .Marland Kitchen... 1 tab two times a day before breakfast and supper    Aspirin 81 Mg Tbec (Aspirin) .Marland Kitchen... Take 1 by mouth once daily  Labs Reviewed: Creat: 1.3 (10/23/2009)     Last Eye Exam: No  diabetic retinopathy.   Early cataracts (11/19/2009) Reviewed HgBA1c results: 6.8 (10/23/2009)  7.1 (05/14/2009)  Orders: TLB-A1C / Hgb A1C (Glycohemoglobin) (83036-A1C)  Problem # 2:  HYPERTENSION (ICD-401.9) Assessment: Unchanged  reasonable control no changes for now  Her updated medication list for this problem includes:    Benicar Hct 40-25 Mg Tabs (Olmesartan medoxomil-hctz) .Marland Kitchen... 1daily    Cartia Xt 120 Mg Cp24 (Diltiazem hcl coated beads) .Marland Kitchen... 1 daily    Nadolol 80 Mg Tabs (Nadolol) .Marland Kitchen... Take 1 tablet by mouth once a day    Terazosin Hcl 5 Mg Caps (Terazosin hcl) .Marland Kitchen... 1 daily  BP today: 158/62 Prior BP: 132/60 (03/01/2010)  10 Yr Risk Heart Disease: 24 % Prior 10 Yr Risk Heart Disease: Not enough information (10/12/2006)  Labs Reviewed: K+: 5.0 (10/23/2009) Creat: : 1.3 (10/23/2009)   Chol: 168 (05/03/2007)   HDL: 36.3 (05/03/2007)   LDL: 95 (05/03/2007)   TG: 185 (05/03/2007)  Orders: TLB-Renal Function Panel (80069-RENAL) TLB-CBC Platelet - w/Differential (85025-CBCD) Venipuncture (16109)  Problem # 3:  SPINAL STENOSIS, LUMBAR (ICD-724.02) Assessment: Comment Only discussed using tylenol regularly and only the meloxicam as needed   Problem # 4:  URINARY INCONTINENCE (ICD-788.30) Assessment: Unchanged will do without meds  Problem # 5:  PULMONARY FIBROSIS, CHRONIC (ICD-515) Assessment: Comment Only stable stutus continues with pulmonary follow up  Complete Medication List: 1)  Xanax 0.25 Mg Tabs (Alprazolam) .Marland Kitchen.. 1 two times a day as needed for nerves 2)  Benicar Hct 40-25 Mg Tabs (Olmesartan medoxomil-hctz) .Marland Kitchen.. 1daily 3)  Metformin Hcl 500 Mg Tabs (Metformin hcl) .Marland Kitchen.. 1 tab two times a day before breakfast and supper 4)  Cartia Xt 120 Mg Cp24 (Diltiazem hcl coated beads) .Marland Kitchen.. 1 daily 5)  Nadolol 80 Mg Tabs (Nadolol) .... Take 1 tablet by mouth once a day 6)  Meloxicam 7.5 Mg Tabs (Meloxicam) .... Take one tablet daily as needed when arthritis is  severe 7)  Terazosin Hcl 5 Mg Caps (Terazosin hcl) .Marland Kitchen.. 1 daily 8)  Prednisone 2.5 Mg Tabs (Prednisone) .Marland Kitchen.. 1 by mouth twice weekly 9)  Centrum Silver Tabs (Multiple vitamins-minerals) .... Take 1 tablet by mouth once a day 10)  Premarin 0.625 Mg/gm Crea (Estrogens, conjugated) .... 2 times a week 11)  Calcium 600-200 Mg-unit Tabs (Calcium-vitamin d) .... Take 1 tablet by mouth two times a day 12)  Ketoconazole 2 % Crea (Ketoconazole) .... Apply two times a day to rash as needed 13)  Fish Oil 1200 Mg Caps (Omega-3 fatty acids) .... Take 1 tablet by mouth once a day 14)  Stool Softener 100 Mg Caps (Docusate sodium) .... As needed 15)  Cranberry 300 Mg Tabs (Cranberry) .... Once daily 16)  Aspirin 81 Mg Tbec (Aspirin) .... Take 1 by mouth once daily 17)  Systane 0.4-0.3 % Soln (Polyethyl glycol-propyl glycol) .... Instill  2 drops in each eye at night 18)  Tylenol Arthritis Pain 650 Mg Cr-tabs (Acetaminophen) .Marland Kitchen.. 1 tab by mouth three times a day for arthritis pain  Hypertension Assessment/Plan:      The patient's hypertensive risk group is category C: Target organ damage and/or diabetes.  Her calculated 10 year risk of coronary heart disease is 24 %.  Today's blood pressure is 158/62.    Patient Instructions: 1)  Please schedule a follow-up appointment in 6 months .  Prescriptions: PREMARIN 0.625 MG/GM  CREA (ESTROGENS, CONJUGATED) 2 times a week  #3  tube x 3   Entered by:   Mervin Hack CMA (AAMA)   Authorized by:   Cindee Salt MD   Signed by:   Mervin Hack CMA (AAMA) on 04/19/2010   Method used:   Print then Give to Patient   RxID:   [id number] TERAZOSIN HCL 5 MG  CAPS (TERAZOSIN HCL) 1 daily  #90 x 3   Entered by:   Mervin Hack CMA (AAMA)   Authorized by:   Cindee Salt MD   Signed by:   Mervin Hack CMA (AAMA) on 04/19/2010   Method used:   Print then Give to Patient   RxID:   [id number] MELOXICAM 7.5 MG  TABS (MELOXICAM) take one tablet  every other day-Monday, Wednesday, Friday  #45 x 3   Entered by:   Mervin Hack CMA (AAMA)   Authorized by:   Cindee Salt MD   Signed by:   Mervin Hack CMA (AAMA) on 04/19/2010   Method used:   Print then Give to Patient   RxID:   [id number] NADOLOL 80 MG  TABS (NADOLOL) Take 1 tablet by mouth once a day  #90 x 3   Entered by:   Mervin Hack CMA (AAMA)   Authorized by:   Cindee Salt MD   Signed by:   Mervin Hack CMA (AAMA) on 04/19/2010   Method used:   Print then Give to Patient   RxID:   [id number] METFORMIN HCL 500 MG TABS (METFORMIN HCL) 1 tab two times a day before breakfast and supper  #180 x 3   Entered by:   Mervin Hack CMA (AAMA)   Authorized by:   Cindee Salt MD   Signed by:   Mervin Hack CMA (AAMA) on 04/19/2010   Method used:   Print then Give to Patient   RxID:   [id number]    Orders Added: 1)  TLB-A1C / Hgb A1C (Glycohemoglobin) [83036-A1C] 2)  TLB-Renal Function Panel [80069-RENAL] 3)  TLB-CBC Platelet - w/Differential [85025-CBCD] 4)  Venipuncture [84166] 5)  Est. Patient Level IV [06301]    Current Allergies (reviewed today): PENICILLIN SULFA * LISINOPRIL

## 2010-06-15 NOTE — Progress Notes (Signed)
Summary: c/o cough- ? increase prednisone  Phone Note Call from Patient Call back at Home Phone [phone]   Caller: Patient Call For: Erica Hansen Reason for Call: Talk to Nurse Summary of Call: Patient has questions about prednisone Initial call taken by: Lehman Prom,  Sep 29, 2009 12:47 PM  Follow-up for Phone Call        Called and spoke with pt.  She is c/o dry cough x 5 days.  She states that she has continued to only take prednisone 5 mg on Tuesdays and Thursdays.  She wants to know if she can increase this to see if it will help with cough.  She is taking tessalon pearles already.  Please advise, thanks! Follow-up by: Vernie Murders,  Sep 29, 2009 1:52 PM  Additional Follow-up for Phone Call Additional follow up Details #1::        ok to go back to 3/ week Additional Follow-up by: Comer Locket. Vassie Loll MD,  Sep 29, 2009 2:17 PM    Additional Follow-up for Phone Call Additional follow up Details #2::    pt also would like rx for Tessalon Perles.  Please advise if ok to send rx. Aundra Millet Reynolds LPN  Sep 29, 2009 2:24 PM   ok - 200 three times a day prn Follow-up by: Comer Locket. Vassie Loll MD,  Sep 29, 2009 3:35 PM  Additional Follow-up for Phone Call Additional follow up Details #3:: Details for Additional Follow-up Action Taken: Rx for tessalon pearles was sent to pharm. Pt aware. Additional Follow-up by: Vernie Murders,  Sep 29, 2009 3:37 PM  Prescriptions: BENZONATATE 200 MG CAPS (BENZONATATE) Take 1 tablet by mouth three times a day as needed  #90 x 0   Entered by:   Vernie Murders   Authorized by:   Comer Locket. Vassie Loll MD   Signed by:   Vernie Murders on 09/29/2009   Method used:   [facility]        [facility] [phone]* (retail)       [address] [place], [place]  [address]       Ph: [phone] or [phone]       Fax: [phone]   RxID:   [id number]

## 2010-06-15 NOTE — Progress Notes (Signed)
Summary: regarding BP meds  Phone Note Call from Patient   Caller: Patient Call For: Cindee Salt MD Summary of Call: Pt is out of town in [place] and will be returning on monday.  She will not have a benicar or nadolol to take monday morning and she is concerned about this.  I told her to take them monday afternoon when she gets home. Initial call taken by: Lowella Petties CMA,  December 02, 2009 9:34 AM  Follow-up for Phone Call        agree the short delay should not be a problem Follow-up by: Cindee Salt MD,  December 02, 2009 2:03 PM

## 2010-06-15 NOTE — Progress Notes (Signed)
Summary: Urinalysis  Phone Note Call from Patient   Caller: Patient Call For: Dr Milinda Antis Summary of Call: Pt brought urinalysis in this AM Initial call taken by: Lewanda Rife LPN,  July 23, 2009 10:06 AM  Follow-up for Phone Call        Patient notified as instructed by telephone. Pt has appt to see Dr Alphonsus Sias on 08/04/09 at 11:15am. I could not find rx for Cipro. Please advise. Lewanda Rife LPN  July 23, 2009 1:30 PM   Additional Follow-up for Phone Call Additional follow up Details #1::        Medication phoned to [facility] as instructed. Lewanda Rife LPN  July 23, 2009 2:20 PM   New Problems: UTI (ICD-599.0)   New Problems: UTI (ICD-599.0) New/Updated Medications: CIPRO 250 MG TABS (CIPROFLOXACIN HCL) 1 by mouth two times a day for 5 days Prescriptions: CIPRO 250 MG TABS (CIPROFLOXACIN HCL) 1 by mouth two times a day for 5 days  #10 x 0   Entered and Authorized by:   Judith Part MD   Signed by:   Lewanda Rife LPN on 16/02/9603   Method used:   Telephoned to ...       [facility] [phone]* (retail)       [address] [place], [place]  [address]       Ph: [phone] or [phone]       Fax: [phone]   RxID:   [id number]   Laboratory Results   Urine Tests  Date/Time Received: July 23, 2009 10:07 AM  Date/Time Reported: July 23, 2009 10:07 AM   Routine Urinalysis   Color: yellow Appearance: Hazy Glucose: negative   (Normal Range: Negative) Bilirubin: negative   (Normal Range: Negative) Ketone: negative   (Normal Range: Negative) Spec. Gravity: 1.015   (Normal Range: 1.003-1.035) Blood: trace-intact   (Normal Range: Negative) pH: 5.0   (Normal Range: 5.0-8.0) Protein: trace   (Normal Range: Negative) Urobilinogen: 0.2   (Normal Range: 0-1) Nitrite: negative   (Normal Range: Negative) Leukocyte Esterace: small   (Normal Range: Negative)  Urine Microscopic WBC/HPF: 3-5 RBC/HPF: 2-3 Bacteria/HPF:  many Mucous/HPF: few Epithelial/HPF: 1-4 Crystals/HPF: few Casts/LPF: 0 Yeast/HPF: 0 Other: 0    Comments: I do think she may have uti this may be reason for elevated cr (kidney number) will tx with low dose cipro--px written on EMR for call in  drink lots of water  f/u with Dr Alphonsus Sias in 1-2 weeks please please send for culture  for vaginal itch - have her get monitat 3 day otc and use as directed (she told me this returned) also thank her for the update on abd pain-- she told me in hall that low abd pain resolved after several good BMs yesterday please update if worse

## 2010-06-15 NOTE — Assessment & Plan Note (Signed)
Summary: Erica Hansen   Vital Signs:  Patient profile:   77 year old female Weight:      152 pounds BMI:     29.79 Temp:     97.7 degrees F oral Pulse rate:   68 / minute Pulse rhythm:   regular BP sitting:   148 / 68  (left arm) Cuff size:   regular  Vitals Entered By: Mervin Hack CMA Duncan Dull) (October 23, 2009 11:27 AM) CC: follow-up visit   History of Present Illness: Saw show in [place] wound up getting sick Spoke to Dr Vassie Loll and had to kick up the prednisone for a while using tessalon as well Breathing does seem somewhat better still limited exercise tolerance---had worsened some with illness  Flu like illness a couple of weeks ago some restless legs and fatigue gatorade seemed to help (low calorie)  Checks sugars several times weekly almost never >130 no sig hypoglycemic spells  No sig arthritis pain  stopped the detrol uses 1 cranberry pill daily No sig incontinence--just frequency  Still with nerves acting up at times Was depressed with lung exacerbation--mood improving again now  Allergies: 1)  Penicillin 2)  Sulfa 3)  * Lisinopril  Past History:  Past medical, surgical, family and social histories (including risk factors) reviewed for relevance to current acute and chronic problems.  Past Medical History: Reviewed history from 05/14/2009 and no changes required. Diverticulosis, colon Hypertension Urinary incontinence Lung fibrosis noted 2004 Osteoarthritis Lumbar spinal stenosis Diabetes mellitus, type II Anxiety  Past Surgical History: Reviewed history from 07/19/2007 and no changes required. DEXA normal  10/2000 partial colectomy 1992 Hysterectomy 1972 Fx humerus  repair  2001 Eyelid repair 1990 L  total knee 11/2005  L3-4, L4-5 decompressive laminectomy--12/08--Cram  Family History: Reviewed history from 10/11/2006 and no changes required. Dad died @88  colon cancer, HTN, DM Mom died@87  Altzheimers, depression 1  sister CAD?? in Dad    Social History: Reviewed history from 10/11/2006 and no changes required. Widowed--lives with daughter [name]) Has 2 sons in [place] Never Smoked Alcohol use-yes--occ  Review of Systems       sleeping okay lost 5#--  "I wasn't feeling right ... not hungry"  Physical Exam  General:  alert and normal appearance.   Neck:  supple, no masses, and no thyromegaly.   Lungs:  normal respiratory effort, no intercostal retractions, and no accessory muscle use.  Fair air movement No wheezes bibasilar dry crackles Heart:  normal rate, regular rhythm, and no gallop.   Gr 3/6 systolic murmur loudest at base Abdomen:  soft and non-tender.   Pulses:  faint in feet Extremities:  trace edema Skin:  no suspicious lesions and no ulcerations.   Psych:  normally interactive, good eye contact, not anxious appearing, and not depressed appearing.    Diabetes Management Exam:    Foot Exam (with socks and/or shoes not present):       Sensory-Pinprick/Light touch:          Left medial foot (L-4): diminished          Left dorsal foot (L-5): diminished          Left lateral foot (S-1): diminished          Right medial foot (L-4): diminished          Right dorsal foot (L-5): diminished          Right lateral foot (S-1): diminished       Inspection:  Left foot: normal          Right foot: normal       Nails:          Left foot: normal          Right foot: normal   Impression & Recommendations:  Problem # 1:  DIABETES MELLITUS, TYPE II (ICD-250.00) Assessment Unchanged  still seems to have good control check labs  Her updated medication list for this problem includes:    Benicar Hct 40-25 Mg Tabs (Olmesartan medoxomil-hctz) .Marland Kitchen... 1daily    Metformin Hcl 500 Mg Tabs (Metformin hcl) .Marland Kitchen... 1 tab two times a day before breakfast and supper    Aspirin 81 Mg Tbec (Aspirin) .Marland Kitchen... Take 1 by mouth once daily  Labs Reviewed: Creat: 1.5 (07/22/2009)     Last  Eye Exam: No diabetic retinopathy.   Cataracts Dr Inez Pilgrim (11/07/2008) Reviewed HgBA1c results: 7.1 (05/14/2009)  7.9 (12/09/2008)  Orders: TLB-A1C / Hgb A1C (Glycohemoglobin) (83036-A1C)  Problem # 2:  OSTEOARTHRITIS (ICD-715.90) Assessment: Unchanged has follow up with Dr Hyacinth Meeker coming up tries to limit meloxicam  The following medications were removed from the medication list:    Tylenol Arthritis Pain 650 Mg Cr-tabs (Acetaminophen) .Marland Kitchen... Take 1 tablet by mouth every morning as needed Her updated medication list for this problem includes:    Meloxicam 7.5 Mg Tabs (Meloxicam) .Marland Kitchen... Take one tablet every other day    Aspirin 81 Mg Tbec (Aspirin) .Marland Kitchen... Take 1 by mouth once daily  Problem # 3:  HYPERTENSION (ICD-401.9) Assessment: Unchanged  reasonable control no changes needed  Her updated medication list for this problem includes:    Benicar Hct 40-25 Mg Tabs (Olmesartan medoxomil-hctz) .Marland Kitchen... 1daily    Cartia Xt 120 Mg Cp24 (Diltiazem hcl coated beads) .Marland Kitchen... 1 daily    Nadolol 80 Mg Tabs (Nadolol) .Marland Kitchen... Take 1 tablet by mouth once a day    Terazosin Hcl 5 Mg Caps (Terazosin hcl) .Marland Kitchen... 1 daily  BP today: 148/68 Prior BP: 140/62 (08/05/2009)  Prior 10 Yr Risk Heart Disease: Not enough information (10/12/2006)  Labs Reviewed: K+: 4.4 (07/22/2009) Creat: : 1.5 (07/22/2009)   Chol: 168 (05/03/2007)   HDL: 36.3 (05/03/2007)   LDL: 95 (05/03/2007)   TG: 185 (05/03/2007)  Orders: TLB-Renal Function Panel (80069-RENAL) TLB-CBC Platelet - w/Differential (85025-CBCD) TLB-Hepatic/Liver Function Pnl (80076-HEPATIC) TLB-TSH (Thyroid Stimulating Hormone) (84443-TSH) Venipuncture ([address])  Problem # 4:  PULMONARY FIBROSIS, CHRONIC (ICD-515) Assessment: Unchanged stable resp status frustrated by recent illnesses and need for increased prednisone when she had been weaning down  Complete Medication List: 1)  Benzonatate 200 Mg Caps (Benzonatate) .... Take 1 tablet by mouth three  times a day as needed 2)  Benicar Hct 40-25 Mg Tabs (Olmesartan medoxomil-hctz) .Marland Kitchen.. 1daily 3)  Xanax 0.25 Mg Tabs (Alprazolam) .Marland Kitchen.. 1 two times a day as needed for nerves 4)  Metformin Hcl 500 Mg Tabs (Metformin hcl) .Marland Kitchen.. 1 tab two times a day before breakfast and supper 5)  Cartia Xt 120 Mg Cp24 (Diltiazem hcl coated beads) .Marland Kitchen.. 1 daily 6)  Nadolol 80 Mg Tabs (Nadolol) .... Take 1 tablet by mouth once a day 7)  Meloxicam 7.5 Mg Tabs (Meloxicam) .... Take one tablet every other day 8)  Terazosin Hcl 5 Mg Caps (Terazosin hcl) .Marland Kitchen.. 1 daily 9)  Premarin 0.625 Mg/gm Crea (Estrogens, conjugated) .... 2 times a week 10)  Ketoconazole 2 % Crea (Ketoconazole) .... Apply two times a day to rash as needed 11)  Calcium 600-200  Mg-unit Tabs (Calcium-vitamin d) .... Take 1 tablet by mouth two times a day 12)  Prednisone 5 Mg Tabs (Prednisone) .... Take 1 by mouth once daily then Jun 16, 2009 take 1 by mouth tuesday, thursday and saturday 13)  Systane 0.4-0.3 % Soln (Polyethyl glycol-propyl glycol) .... Instill 2 drops in each eye at night 14)  Aspirin 81 Mg Tbec (Aspirin) .... Take 1 by mouth once daily 15)  Centrum Silver Tabs (Multiple vitamins-minerals) .... Take 1 tablet by mouth once a day 16)  Fish Oil 1200 Mg Caps (Omega-3 fatty acids) .... Take 1 tablet by mouth once a day 17)  Stool Softener 100 Mg Caps (Docusate sodium) .... As needed  Patient Instructions: 1)  Please schedule a follow-up appointment in 6 months .  Prescriptions: TERAZOSIN HCL 5 MG  CAPS (TERAZOSIN HCL) 1 daily  #90 x 3   Entered by:   Mervin Hack CMA (AAMA)   Authorized by:   Cindee Salt MD   Signed by:   Mervin Hack CMA (AAMA) on 10/23/2009   Method used:   Print then Give to Patient   RxID:   [id number] CARTIA XT 120 MG CP24 (DILTIAZEM HCL COATED BEADS) 1 daily  #90 x 3   Entered by:   Mervin Hack CMA (AAMA)   Authorized by:   Cindee Salt MD   Signed by:   Mervin Hack CMA (AAMA) on  10/23/2009   Method used:   Print then Give to Patient   RxID:   [id number] BENICAR HCT 40-25 MG TABS (OLMESARTAN MEDOXOMIL-HCTZ) 1daily  #90 x 3   Entered by:   Mervin Hack CMA (AAMA)   Authorized by:   Cindee Salt MD   Signed by:   Mervin Hack CMA (AAMA) on 10/23/2009   Method used:   Print then Give to Patient   RxID:   [id number]   Current Allergies (reviewed today): PENICILLIN SULFA * LISINOPRIL

## 2010-06-15 NOTE — Progress Notes (Signed)
Summary: discuss with Dr. Alphonsus Sias, per pt  Phone Note Call from Patient Call back at Home Phone [phone]   Caller: Patient Call For: Dr. Milinda Antis Summary of Call: Pt called regarding her uti sxs.  She said she is still having itching.  She says she talked with Dr. Alphonsus Sias yesterday who told her to leave a message for you that the two of you needed to get together to compare notes on this pt. Initial call taken by: Lowella Petties CMA,  July 27, 2009 8:36 AM  Follow-up for Phone Call        I know that she has upcoming appt with Dr Alphonsus Sias  I suspect yeast now - but given her symptoms were gone when I saw her --did not do the wet prep now she is on abx which pre- disposes to yeast  did she try the otc yeast med? did it help?- let me know Follow-up by: Erica Part MD,  July 27, 2009 8:58 AM  Additional Follow-up for Phone Call Additional follow up Details #1::        Pt said did last Monistat 3 application on Sat, 07/25/09 and itching is better but this AM still have slight itching sensation. Pt has one more Cipro to take. Diarrhea was better and then on Sun 07/26/09 had diarrhea on and off. No diarrhea since last night. Erica Rife LPN  July 27, 2009 9:54 AM     Additional Follow-up for Phone Call Additional follow up Details #2::    can try a diflucan for yeast px written on EMR for call in  f/u Dr Alphonsus Sias as planned and update if not imp  Follow-up by: Erica Part MD,  July 27, 2009 10:28 AM  Additional Follow-up for Phone Call Additional follow up Details #3:: Details for Additional Follow-up Action Taken: Patient notified as instructed by telephone. [facility] as instructed. Erica Rife LPN  July 27, 2009 10:51 AM   New/Updated Medications: DIFLUCAN 150 MG TABS (FLUCONAZOLE) 1 by mouth times one for yeast infection Prescriptions: DIFLUCAN 150 MG TABS (FLUCONAZOLE) 1 by mouth times one for yeast infection  #1 x 0   Entered and  Authorized by:   Erica Part MD   Signed by:   Erica Part MD on 07/27/2009   Method used:   Telephoned to ...       [facility] [phone]* (retail)       [address] [place], [place]  [address]       Ph: [phone] or [phone]       Fax: [phone]   RxID:   [id number]   Appended Document: discuss with Dr. Alphonsus Sias, per pt Please let her know that the medicine Dr Milinda Antis sent for her should relieve symptoms If she is not better, keep appt 3/22. If better, she can probably cancel that and just keep routine appt in May (or whenever it is)  Appended Document: discuss with Dr. Alphonsus Sias, per pt spoke with patient and she will keep the 3/22 appt, she took the diflucan and stated that her "kidney" levels were elevated, she forgot to mention to you on Sat.

## 2010-06-15 NOTE — Medication Information (Signed)
Summary: Diabetes Supplies/M.E.D Supplies  Diabetes Supplies/M.E.D Supplies   Imported By: Sherian Rein 02/03/2010 10:36:40  _____________________________________________________________________  External Attachment:    Type:   Image     Comment:   External Document

## 2010-06-15 NOTE — Assessment & Plan Note (Signed)
Summary: FOLLOW UP UTI PER DR TOWER/RI   Vital Signs:  Patient profile:   77 year old female Weight:      156 pounds Temp:     98 degrees F oral Pulse rate:   70 / minute Pulse rhythm:   regular BP sitting:   130 / 60  (left arm) Cuff size:   regular  Vitals Entered By: Mervin Hack CMA Duncan Dull) (August 04, 2009 11:39 AM) CC: UTI   History of Present Illness: Here for follow up with UTI Voiding normally now Has increased her water and is taking some cranberry juice  Itching is gone now for the most part--not in the past few days No discharge  No fever  not sick  [place] yesterday feet got tangled going into [employer] Hit left elbow and bruised right calf Ongoing swelling in calves hasn't been using cane--discussed  Allergies: 1)  Penicillin 2)  Sulfa 3)  * Lisinopril  Past History:  Past medical, surgical, family and social histories (including risk factors) reviewed for relevance to current acute and chronic problems.  Past Medical History: Reviewed history from 05/14/2009 and no changes required. Diverticulosis, colon Hypertension Urinary incontinence Lung fibrosis noted 2004 Osteoarthritis Lumbar spinal stenosis Diabetes mellitus, type II Anxiety  Past Surgical History: Reviewed history from 07/19/2007 and no changes required. DEXA normal  10/2000 partial colectomy 1992 Hysterectomy 1972 Fx humerus  repair  2001 Eyelid repair 1990 L  total knee 11/2005  L3-4, L4-5 decompressive laminectomy--12/08--Cram  Family History: Reviewed history from 10/11/2006 and no changes required. Dad died @88  colon cancer, HTN, DM Mom died@87  Altzheimers, depression 1 sister CAD?? in Dad    Social History: Reviewed history from 10/11/2006 and no changes required. Widowed--lives with daughter [name]) Has 2 sons in [place] Never Smoked Alcohol use-yes--occ  Review of Systems       appetite is not great--but picking up some weight is  stable  Physical Exam  General:  alert.  NAD Abdomen:  soft and non-tender.   Msk:  No CVA tenderness Extremities:  no edema Skin:  blood blister on left  elbow no tenderness or warmth or redness  open superficial area right over left olecranon  feet and calves normal without any lesions or inflammation   Impression & Recommendations:  Problem # 1:  OTH&UNS SUP INJURY SHLDR&UP ARM W/O MENTION INF (ICD-912.8) Assessment New discussed local care with patient and daughter no infection discussed using cane  Problem # 2:  UTI (ICD-599.0) Assessment: Comment Only  no symptoms will not treat further despite the urinalysis will try some cranberry tabs  The following medications were removed from the medication list:    Cipro 250 Mg Tabs (Ciprofloxacin hcl) .Marland Kitchen... 1 by mouth two times a day for 5 days Her updated medication list for this problem includes:    Detrol La 4 Mg Cp24 (Tolterodine tartrate) .Marland Kitchen... 1 daily    Cephalexin 500 Mg Caps (Cephalexin) .Marland Kitchen... Take one capsule prior to dental appt and one capsule 4 hrs after dental appt.  Orders: UA Dipstick w/o Micro (manual) (32992)  Complete Medication List: 1)  Benicar Hct 40-25 Mg Tabs (Olmesartan medoxomil-hctz) .Marland Kitchen.. 1daily 2)  Detrol La 4 Mg Cp24 (Tolterodine tartrate) .Marland Kitchen.. 1 daily 3)  Xanax 0.25 Mg Tabs (Alprazolam) .Marland Kitchen.. 1 two times a day as needed for nerves 4)  Aspirin 81 Mg Tbec (Aspirin) .... Take 1 by mouth once daily 5)  Metformin Hcl 500 Mg Tabs (Metformin hcl) .Marland Kitchen.. 1 tab two  times a day before breakfast and supper 6)  Cartia Xt 120 Mg Cp24 (Diltiazem hcl coated beads) .Marland Kitchen.. 1 daily 7)  Nadolol 80 Mg Tabs (Nadolol) .... Take 1 tablet by mouth once a day 8)  Centrum Silver Tabs (Multiple vitamins-minerals) .... Take 1 tablet by mouth once a day 9)  Fish Oil 1200 Mg Caps (Omega-3 fatty acids) .... Take 1 tablet by mouth once a day 10)  Tylenol Pm Extra Strength 500-25 Mg Tabs (Diphenhydramine-apap (sleep)) .... Take  1 by mouth at bedtime as needed 11)  Meloxicam 7.5 Mg Tabs (Meloxicam) .... Take one tablet every other day 12)  Terazosin Hcl 5 Mg Caps (Terazosin hcl) .Marland Kitchen.. 1 daily 13)  Premarin 0.625 Mg/gm Crea (Estrogens, conjugated) .... 2 times a week 14)  Ketoconazole 2 % Crea (Ketoconazole) .... Apply two times a day to rash as needed 15)  Calcium 600-200 Mg-unit Tabs (Calcium-vitamin d) .... Take 1 tablet by mouth two times a day 16)  Tylenol Arthritis Pain 650 Mg Cr-tabs (Acetaminophen) .... Take 1 tablet by mouth every morning as needed 17)  Prednisone 5 Mg Tabs (Prednisone) .... Take 1 by mouth once daily then Jun 16, 2009 take 1 by mouth tuesday, thursday and saturday 18)  Systane 0.4-0.3 % Soln (Polyethyl glycol-propyl glycol) .... Instill 2 drops in each eye at night 19)  Cephalexin 500 Mg Caps (Cephalexin) .... Take one capsule prior to dental appt and one capsule 4 hrs after dental appt. 20)  Stool Softener  .... Otc as directed.  Patient Instructions: 1)  Keep regular follow up visit 2)  Please use your cane 3)  You can try a daily cranberry tab to try to prevent further urinary infections  Current Allergies (reviewed today): PENICILLIN SULFA * LISINOPRIL  Laboratory Results   Urine Tests  Date/Time Received: August 04, 2009 11:44 AM Date/Time Reported: August 04, 2009 11:44 AM  Routine Urinalysis   Color: yellow Appearance: Cloudy Glucose: negative   (Normal Range: Negative) Bilirubin: negative   (Normal Range: Negative) Ketone: negative   (Normal Range: Negative) Spec. Gravity: 1.025   (Normal Range: 1.003-1.035) Blood: large   (Normal Range: Negative) pH: 7.0   (Normal Range: 5.0-8.0) Protein: 30   (Normal Range: Negative) Urobilinogen: 0.2   (Normal Range: 0-1) Nitrite: negative   (Normal Range: Negative) Leukocyte Esterace: moderate   (Normal Range: Negative)

## 2010-06-15 NOTE — Assessment & Plan Note (Signed)
Summary: rov 3 months w/ cxr///kp   Primary Provider/Referring Provider:  Tillman Abide MD  CC:  Pt here for 3 month follow up with cxr. Pt states no complaints.  History of Present Illness: 77/F, never smoker for FU of pulmonary fibrosis, on empiric steroids since 2/10 with ceiling of 20 mg & floor 5 mg  2/10 started pred, increased to 20 mg 3/10 Cough gone, sugars OK, no wt gain or edema, dyspnea better 5/10 gained 2 lbs, no cough, able to exercise in the pool, sugars OK,no edema January 08, 2009  Dropped prednisone to 10 mg in 7/10 & 5 mg in 8/10. Now c/o dry cough x 2 weeks & increasing dyspnea esp on climbing steps. Easy bruising prior to taking steroids. .  August 05, 2009 2:06 PM  Interim UTI , fall with bruising over lt elbow, tessalon caused diarrhea - has not needed this. No worsening of symptoms with alternate day prednisone.  Current Medications (verified): 1)  Benicar Hct 40-25 Mg Tabs (Olmesartan Medoxomil-Hctz) .Marland Kitchen.. 1daily 2)  Detrol La 4 Mg Cp24 (Tolterodine Tartrate) .Marland Kitchen.. 1 Daily 3)  Xanax 0.25 Mg Tabs (Alprazolam) .Marland Kitchen.. 1 Two Times A Day As Needed For Nerves 4)  Aspirin 81 Mg Tbec (Aspirin) .... Take 1 By Mouth Once Daily 5)  Metformin Hcl 500 Mg Tabs (Metformin Hcl) .Marland Kitchen.. 1 Tab Two Times A Day Before Breakfast and Supper 6)  Cartia Xt 120 Mg Cp24 (Diltiazem Hcl Coated Beads) .Marland Kitchen.. 1 Daily 7)  Nadolol 80 Mg  Tabs (Nadolol) .... Take 1 Tablet By Mouth Once A Day 8)  Centrum Silver   Tabs (Multiple Vitamins-Minerals) .... Take 1 Tablet By Mouth Once A Day 9)  Fish Oil 1200 Mg Caps (Omega-3 Fatty Acids) .... Take 1 Tablet By Mouth Once A Day 10)  Tylenol Pm Extra Strength 500-25 Mg  Tabs (Diphenhydramine-Apap (Sleep)) .... Take 1 By Mouth At Bedtime As Needed 11)  Meloxicam 7.5 Mg  Tabs (Meloxicam) .... Take One Tablet Every Other Day 12)  Terazosin Hcl 5 Mg  Caps (Terazosin Hcl) .Marland Kitchen.. 1 Daily 13)  Premarin 0.625 Mg/gm  Crea (Estrogens, Conjugated) .... 2 Times A Week 14)   Ketoconazole 2 %  Crea (Ketoconazole) .... Apply Two Times A Day To Rash As Needed 15)  Calcium 600-200 Mg-Unit Tabs (Calcium-Vitamin D) .... Take 1 Tablet By Mouth Two Times A Day 16)  Tylenol Arthritis Pain 650 Mg Cr-Tabs (Acetaminophen) .... Take 1 Tablet By Mouth Every Morning As Needed 17)  Prednisone 5 Mg Tabs (Prednisone) .... Take 1 By Mouth Once Daily Then Jun 16, 2009 Take 1 By Mouth Tuesday, Thursday and Saturday 18)  Systane 0.4-0.3 % Soln (Polyethyl Glycol-Propyl Glycol) .... Instill 2 Drops in Each Eye At Night 19)  Cephalexin 500 Mg Caps (Cephalexin) .... Take One Capsule Prior To Dental Appt and One Capsule 4 Hrs After Dental Appt. 20)  Stool Softener .... Otc As Directed. 21)  Benzonatate 200 Mg Caps (Benzonatate) .... Take 1 Tablet By Mouth Three Times A Day As Needed  Allergies (verified): 1)  Penicillin 2)  Sulfa 3)  * Lisinopril  Past History:  Past Medical History: Last updated: 05/14/2009 Diverticulosis, colon Hypertension Urinary incontinence Lung fibrosis noted 2004 Osteoarthritis Lumbar spinal stenosis Diabetes mellitus, type II Anxiety  Social History: Last updated: 10/11/2006 Widowed--lives with daughter [name]) Has 2 sons in [place] Never Smoked Alcohol use-yes--occ  Past Pulmonary History:  Pulmonary History: PFTs 7/09 >>FVC 59% - mod restriction with desaturation on exertion.  BLL interstitial infiltrates not typical of IPF.  ANA neg, RA factor neg - esr 73  Pneumovax 2003  11/09 CT chest >>Pulmonary parenchymal pattern of fibrosis is most consistent with usual interstitial pneumonia (UIP).  Increase in superimposed ground-glass suggests areas of active inflammation. Destaurates on exertion  Review of Systems       The patient complains of dyspnea on exertion.  The patient denies anorexia, fever, weight loss, weight gain, vision loss, decreased hearing, hoarseness, chest pain, syncope, peripheral edema, prolonged cough, headaches,  hemoptysis, abdominal pain, melena, hematochezia, severe indigestion/heartburn, hematuria, muscle weakness, suspicious skin lesions, difficulty walking, depression, unusual weight change, and abnormal bleeding.    Vital Signs:  Patient profile:   77 year old female Height:      60 inches Weight:      158.13 pounds O2 Sat:      92 % on Room air Temp:     97.8 degrees F oral Pulse rate:   74 / minute BP sitting:   140 / 62  (right arm) Cuff size:   regular  Vitals Entered By: Zackery Barefoot CMA (August 05, 2009 1:50 PM)  O2 Flow:  Room air CC: Pt here for 3 month follow up with cxr. Pt states no complaints Comments Medications reviewed with patient Verified contact number and pharmacy with patient Zackery Barefoot CMA  August 05, 2009 1:52 PM    Physical Exam  Additional Exam:  Gen. Pleasant, well-nourished, in no distress ENT - no lesions, no post nasal drip Neck: No JVD, no thyromegaly, no carotid bruits Lungs: no use of accessory muscles, no dullness to percussion, BL 1/2  rales  Cardiovascular: Rhythm regular, heart sounds  normal, no murmurs or gallops, no peripheral edema Musculoskeletal: No deformities, no cyanosis or clubbing      CXR  Procedure date:  08/05/2009  Findings:      Comparison: 01/08/2009 and earlier   Findings: Chronic interstitial / fibrotic changes again noted which tend to favor the lung bases.  No definite interval change compared to prior studies.  No evidence for superimposed acute disease.  No significant pleural fluid.   IMPRESSION: Chronic fibrotic disease without definite change since prior studies.  Impression & Recommendations:  Problem # 1:  PULMONARY FIBROSIS, CHRONIC (ICD-515) Will continue to attempt to taper steroids to off , if possible. Has done well with taper so far.  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  Orders: Est. Patient Level III (16109)  Medications Added to  Medication List This Visit: 1)  Benzonatate 200 Mg Caps (Benzonatate) .... Take 1 tablet by mouth three times a day as needed  Patient Instructions: 1)  Please schedule a follow-up appointment in 3 months. 2)  Decrease prednisone to Tue/ Thu for March & April 3)  If feeling well, decrease to once/ wk in May

## 2010-06-15 NOTE — Medication Information (Signed)
Summary: Possible Drug Interaction Between Diltiazem & Nadolol/Silver Scr  Possible Drug Interaction Between Diltiazem & Nadolol/Silver Script   Imported By: Lanelle Bal 10/08/2009 08:14:24  _____________________________________________________________________  External Attachment:    Type:   Image     Comment:   External Document

## 2010-06-15 NOTE — Consult Note (Signed)
Summary: [facility]   Imported By: Lanelle Bal 12/01/2009 11:01:34  _____________________________________________________________________  External Attachment:    Type:   Image     Comment:   External Document  Appended Document: [facility]    Clinical Lists Changes  Observations: Added new observation of DIAB EYE EX: No diabetic retinopathy.   Early cataracts (11/19/2009 21:29)       Diabetic Eye Exam  Procedure date:  11/19/2009  Findings:      No diabetic retinopathy.   Early cataracts

## 2010-06-15 NOTE — Assessment & Plan Note (Signed)
Summary: 3 MONTHS/ MBW   Primary Provider/Referring Provider:  Tillman Abide MD  CC:  3 month follow up.  Pt states breathing is the same.  States cough returned but since she has started benzonatate and increased prednisone and cough seems to be getting better.  Cough is nonprod now.  Denies wheezing and chest tightness.  Marland Kitchen  History of Present Illness: 87/F, never smoker for FU of pulmonary fibrosis, on empiric steroids since 2/10 with ceiling of 20 mg & floor 5 mg  2/10 started pred, increased to 20 mg 3/10 Cough gone, sugars OK, no wt gain or edema, dyspnea better 5/10 gained 2 lbs, no cough, able to exercise in the pool, sugars OK,no edema January 08, 2009  Dropped prednisone to 10 mg in 7/10 & 5 mg in 8/10. Now c/o dry cough x 2 weeks & increasing dyspnea esp on climbing steps. Easy bruising prior to taking steroids. .  November 04, 2009 2:09 PM  chest cold towards end of may, now better. breathing is the same.  States cough returned but since she has started benzonatate and increased prednisone and cough seems to be getting better.  Cough is nonprod now  Current Medications (verified): 1)  Benzonatate 200 Mg Caps (Benzonatate) .... Take 1 Tablet By Mouth Three Times A Day As Needed 2)  Benicar Hct 40-25 Mg Tabs (Olmesartan Medoxomil-Hctz) .Marland Kitchen.. 1daily 3)  Xanax 0.25 Mg Tabs (Alprazolam) .Marland Kitchen.. 1 Two Times A Day As Needed For Nerves 4)  Metformin Hcl 500 Mg Tabs (Metformin Hcl) .Marland Kitchen.. 1 Tab Two Times A Day Before Breakfast and Supper 5)  Cartia Xt 120 Mg Cp24 (Diltiazem Hcl Coated Beads) .Marland Kitchen.. 1 Daily 6)  Nadolol 80 Mg  Tabs (Nadolol) .... Take 1 Tablet By Mouth Once A Day 7)  Meloxicam 7.5 Mg  Tabs (Meloxicam) .... Take One Tablet Every Other Day-Monday, Wednesday, Friday 8)  Terazosin Hcl 5 Mg  Caps (Terazosin Hcl) .Marland Kitchen.. 1 Daily 9)  Premarin 0.625 Mg/gm  Crea (Estrogens, Conjugated) .... 2 Times A Week 10)  Ketoconazole 2 %  Crea (Ketoconazole) .... Apply Two Times A Day To Rash As  Needed 11)  Calcium 600-200 Mg-Unit Tabs (Calcium-Vitamin D) .... Take 1 Tablet By Mouth Two Times A Day 12)  Prednisone 5 Mg Tabs (Prednisone) .... Take 1 By Mouth Once Daily Then Jun 16, 2009 Take 1 By Mouth Tuesday, Thursday and Saturday 13)  Systane 0.4-0.3 % Soln (Polyethyl Glycol-Propyl Glycol) .... Instill 2 Drops in Each Eye At Night 14)  Aspirin 81 Mg Tbec (Aspirin) .... Take 1 By Mouth Once Daily 15)  Centrum Silver   Tabs (Multiple Vitamins-Minerals) .... Take 1 Tablet By Mouth Once A Day 16)  Fish Oil 1200 Mg Caps (Omega-3 Fatty Acids) .... Take 1 Tablet By Mouth Once A Day 17)  Stool Softener 100 Mg Caps (Docusate Sodium) .... As Needed 18)  Cranberry .... Once Daily  Allergies (verified): 1)  Penicillin 2)  Sulfa 3)  * Lisinopril  Past History:  Past Medical History: Last updated: 05/14/2009 Diverticulosis, colon Hypertension Urinary incontinence Lung fibrosis noted 2004 Osteoarthritis Lumbar spinal stenosis Diabetes mellitus, type II Anxiety  Social History: Last updated: 10/11/2006 Widowed--lives with daughter [name]) Has 2 sons in [place] Never Smoked Alcohol use-yes--occ  Past Pulmonary History:  Pulmonary History: PFTs 7/09 >>FVC 59% - mod restriction with desaturation on exertion. BLL interstitial infiltrates not typical of IPF.  ANA neg, RA factor neg - esr 71  Pneumovax 2003  11/09 CT  chest >>Pulmonary parenchymal pattern of fibrosis is most consistent with usual interstitial pneumonia (UIP).  Increase in superimposed ground-glass suggests areas of active inflammation. Destaurates on exertion  Review of Systems       The patient complains of dyspnea on exertion and peripheral edema.  The patient denies anorexia, fever, weight loss, weight gain, vision loss, decreased hearing, hoarseness, chest pain, syncope, prolonged cough, headaches, hemoptysis, abdominal pain, melena, hematochezia, severe indigestion/heartburn, hematuria, muscle  weakness, suspicious skin lesions, difficulty walking, depression, unusual weight change, and abnormal bleeding.    Vital Signs:  Patient profile:   77 year old female Height:      60 inches Weight:      152 pounds BMI:     29.79 O2 Sat:      95 % on Room air Temp:     98.2 degrees F oral Pulse rate:   69 / minute BP sitting:   154 / 72  (left arm) Cuff size:   regular  Vitals Entered By: Gweneth Dimitri RN (November 04, 2009 1:50 PM)  O2 Flow:  Room air CC: 3 month follow up.  Pt states breathing is the same.  States cough returned but since she has started benzonatate and increased prednisone, cough seems to be getting better.  Cough is nonprod now.  Denies wheezing and chest tightness.   Comments Medications reviewed with patient Daytime contact number verified with patient. Gweneth Dimitri RN  November 04, 2009 1:51 PM    Physical Exam  Additional Exam:  Gen. Pleasant, well-nourished, in no distress ENT - no lesions, no post nasal drip Neck: No JVD, no thyromegaly, no carotid bruits Lungs: no use of accessory muscles, no dullness to percussion, BL 1/2  rales  Cardiovascular: Rhythm regular, heart sounds  normal, no murmurs or gallops, no peripheral edema Musculoskeletal: No deformities, no cyanosis or clubbing      Impression & Recommendations:  Problem # 1:  PULMONARY FIBROSIS, CHRONIC (ICD-515) stay on 5mg  3 times/ wk If stable x 3 mnths, then decrease to twice/ wk Ok to fly to [place]. Aerobic exercise x 30 mins/d  Medications Added to Medication List This Visit: 1)  Meloxicam 7.5 Mg Tabs (Meloxicam) .... Take one tablet every other day-monday, wednesday, friday 2)  Cranberry  .... Once daily  Other Orders: Est. Patient Level III (16109)  Patient Instructions: 1)  1.  Follow up with TP in 4 months

## 2010-06-15 NOTE — Assessment & Plan Note (Signed)
Summary: ? YEAST INFECTION/NT   Vital Signs:  Patient profile:   77 year old female Height:      60 inches Weight:      157.75 pounds Temp:     97.7 degrees F oral Pulse rate:   72 / minute Pulse rhythm:   regular BP sitting:   142 / 68  (left arm) Cuff size:   regular  Vitals Entered By: Lewanda Rife LPN (July 22, 1608 12:21 PM)  History of Present Illness: is having some pain in her lower abdomen is constipated (but just had bm while here)-- taking some stool softeners  a little now  for a while was not eating well and somewhat fatigued  has hx of diverticulitis (has seen Dr Arlyce Dice in the past ) -- had colonosc (polyp also)  is worried about diverticulitis  pain is all the way across  no blood in her stool   in addition - ? if had a yeast infection -- but now does not think so  has had hyst in the past - and no longer has gyn exam  was itching and now completely better with premarin cream    no fever  vomited fish oil once with a cough spell   went to dentist last thrusday -- takes keflex as pre med since she had knee surgery    Allergies: 1)  Penicillin 2)  Sulfa 3)  * Lisinopril  Past History:  Past Medical History: Last updated: 05/14/2009 Diverticulosis, colon Hypertension Urinary incontinence Lung fibrosis noted 2004 Osteoarthritis Lumbar spinal stenosis Diabetes mellitus, type II Anxiety  Past Surgical History: Last updated: 07/19/2007 DEXA normal  10/2000 partial colectomy 1992 Hysterectomy 1972 Fx humerus  repair  2001 Eyelid repair 1990 L  total knee 11/2005  L3-4, L4-5 decompressive laminectomy--12/08--[facility]  Family History: Last updated: November 10, 2006 Dad died @88  colon cancer, HTN, DM Mom died@87  Altzheimers, depression 1 sister CAD?? in Dad    Social History: Last updated: 11-10-2006 Widowed--lives with daughter [name]) Has 2 sons in [place] Never Smoked Alcohol use-yes--occ  Risk Factors: Smoking Status: never  (11-10-06)  Review of Systems General:  Complains of fatigue; denies chills, fever, loss of appetite, malaise, and weight loss. Eyes:  Denies discharge and eye irritation. CV:  Denies chest pain or discomfort, palpitations, and shortness of breath with exertion. Resp:  Denies cough, pleuritic, shortness of breath, and wheezing. GI:  Complains of abdominal pain, change in bowel habits, constipation, and gas; denies bloody stools, dark tarry stools, hemorrhoids, indigestion, nausea, and vomiting blood. GU:  Denies abnormal vaginal bleeding, discharge, and dysuria. Derm:  Denies itching, lesion(s), poor wound healing, and rash. Neuro:  Denies numbness and tingling. Heme:  Denies abnormal bruising and bleeding.  Physical Exam  General:  overwt elderly female well appearing  Head:  normocephalic, atraumatic, and no abnormalities observed.   Eyes:  vision grossly intact, pupils equal, pupils round, and pupils reactive to light.  no conjunctival pallor, injection or icterus  Mouth:  pharynx pink and moist.   Neck:  supple with full rom and no masses or thyromegally, no JVD or carotid bruit  Chest Wall:  No deformities, masses, or tenderness noted. Lungs:  Normal respiratory effort, chest expands symmetrically. Lungs are clear to auscultation, no crackles or wheezes. Heart:  normal rate, regular rhythm, and no gallop.   Soft systolic murmur Abdomen:  mild tenderness bilat LQ without rebound or gaurding  nl bs 4 Q  no distention, no masses, no hepatomegaly, and  no splenomegaly.   Msk:  no CVA tenderness  Extremities:  1+ edema Neurologic:  sensation intact to light touch, gait normal, and DTRs symmetrical and normal.   Skin:  Intact without suspicious lesions or rashes no pallor or jaundice  Cervical Nodes:  No lymphadenopathy noted Inguinal Nodes:  No significant adenopathy Psych:  anxious but pleasant    Impression & Recommendations:  Problem # 1:  ABDOMINAL PAIN  (ICD-789.00) Assessment New lower abd pain with constipation- improved since having bm in the office  want to also check ua - but pt could not leave sample so took cup home to bring back  check cbc with diff in light of remote hx of diverticulitis (no fever or other symptoms)  will update with results  adv to keep up water intake and use colace or miralax as needed for constipation as well  adv to call asap if abd pain returns or worsens Orders: Venipuncture (16109) TLB-BMP (Basic Metabolic Panel-BMET) (80048-METABOL) TLB-CBC Platelet - w/Differential (85025-CBCD) TLB-Hepatic/Liver Function Pnl (80076-HEPATIC)  Problem # 2:  VAGINAL PRURITUS (ICD-698.1) Assessment: Comment Only pt originally made appt for this but states symptoms are totally resolved after using premarin cream-- so not eval today  Complete Medication List: 1)  Benicar Hct 40-25 Mg Tabs (Olmesartan medoxomil-hctz) .Marland Kitchen.. 1daily 2)  Detrol La 4 Mg Cp24 (Tolterodine tartrate) .Marland Kitchen.. 1 daily 3)  Xanax 0.25 Mg Tabs (Alprazolam) .Marland Kitchen.. 1 two times a day as needed for nerves 4)  Aspirin 81 Mg Tbec (Aspirin) .... Take 1 by mouth once daily 5)  Metformin Hcl 500 Mg Tabs (Metformin hcl) .Marland Kitchen.. 1 tab two times a day before breakfast and supper 6)  Cartia Xt 120 Mg Cp24 (Diltiazem hcl coated beads) .Marland Kitchen.. 1 daily 7)  Nadolol 80 Mg Tabs (Nadolol) .... Take 1 tablet by mouth once a day 8)  Centrum Silver Tabs (Multiple vitamins-minerals) .... Take 1 tablet by mouth once a day 9)  Fish Oil 1200 Mg Caps (Omega-3 fatty acids) .... Take 1 tablet by mouth once a day 10)  Tylenol Pm Extra Strength 500-25 Mg Tabs (Diphenhydramine-apap (sleep)) .... Take 1 by mouth at bedtime as needed 11)  Meloxicam 7.5 Mg Tabs (Meloxicam) .... Take one tablet every other day 12)  Terazosin Hcl 5 Mg Caps (Terazosin hcl) .Marland Kitchen.. 1 daily 13)  Premarin 0.625 Mg/gm Crea (Estrogens, conjugated) .... 2 times a week 14)  Ketoconazole 2 % Crea (Ketoconazole) .... Apply two  times a day to rash as needed 15)  Calcium 600-200 Mg-unit Tabs (Calcium-vitamin d) .... Take 1 tablet by mouth two times a day 16)  Tylenol Arthritis Pain 650 Mg Cr-tabs (Acetaminophen) .... Take 1 tablet by mouth every morning as needed 17)  Prednisone 5 Mg Tabs (Prednisone) .... Take 1 by mouth once daily then Jun 16, 2009 take 1 by mouth tuesday, thursday and saturday 18)  Systane 0.4-0.3 % Soln (Polyethyl glycol-propyl glycol) .... Instill 2 drops in each eye at night 19)  Cephalexin 500 Mg Caps (Cephalexin) .... Take one capsule prior to dental appt and one capsule 4 hrs after dental appt. 20)  Stool Softener  .... Otc as directed.  Patient Instructions: 1)  please return urine specimen to [place] when able  2)  you can try over the counter miralax if constipation worsens again 3)  labs today 4)  will update you with results  5)  if abdominal pain worsens -- please call or seek care in ER if after hours   Current  Allergies (reviewed today): PENICILLIN SULFA * LISINOPRIL

## 2010-06-15 NOTE — Progress Notes (Signed)
Summary: ? Yeast infection  Phone Note Call from Patient Call back at Home Phone [phone]   Caller: Patient Call For: Erica Salt MD Summary of Call: Patient states that she doesn't feel good.  No nausea, no vomiting, no diarrhea, no SOB, no wheezing.  Says she may have a yeast infection because she has some itching in her private area, no burning upon urination, no pain.  Scheduled patient to see Dr. Milinda Antis on Wednesday at 11:45.   Initial call taken by: Linde Gillis CMA [facility]),  July 20, 2009 9:08 AM

## 2010-06-15 NOTE — Letter (Signed)
Summary: Oletta Lamas MD  Oletta Lamas MD   Imported By: [place] 11/09/2009 07:43:53  _____________________________________________________________________  External Attachment:    Type:   Image     Comment:   External Document  Appended Document: Oletta Lamas MD doing well 4 years after left TKR

## 2010-06-17 NOTE — Assessment & Plan Note (Signed)
Summary: rov 2 months///kp   Visit Type:  Follow-up Primary Provider/Referring Provider:  Tillman Abide MD  CC:  Pt states is out of Prednisone as of today. No new complaints.  History of Present Illness: 87/F, never smoker for FU of pulmonary fibrosis, on empiric steroids since 2/10 with ceiling of 20 mg   On 2.5  mg twice/ wk currently   January 08, 2009  Dropped prednisone to 10 mg in 7/10 & 5 mg in 8/10. Now c/o dry cough x 2 weeks & increasing dyspnea esp on climbing steps. Easy bruising prior to taking steroids. .  November 04, 2009 2:09 PM  chest cold towards end of may, now better. breathing is the same.  States cough returned but since she has started benzonatate and increased prednisone and cough seems to be getting better.  Cough is nonprod now   May 03, 2010 2:02 PM   Breathing has been at baseline, still gets winded with activity. No increased cough or congestion. Has been on a very slow steroid taper, currently on 2. 5 mg twice weekly. She does need refill on steroid.  With steroid decrease from last OV, did not see increase in cough or dyspnea.  Denies chest pain, orthopnea, hemoptysis, fever, n/v/d, edema, headache   Preventive Screening-Counseling & Management  Alcohol-Tobacco     Smoking Status: never  Current Medications (verified): 1)  Xanax 0.25 Mg Tabs (Alprazolam) .... Take 1 Tab By Mouth At Bedtime 2)  Benicar Hct 40-25 Mg Tabs (Olmesartan Medoxomil-Hctz) .Marland Kitchen.. 1 Daily 3)  Metformin Hcl 500 Mg Tabs (Metformin Hcl) .Marland Kitchen.. 1 Tab Two Times A Day Before Breakfast and Supper 4)  Cartia Xt 120 Mg Cp24 (Diltiazem Hcl Coated Beads) .Marland Kitchen.. 1 Daily 5)  Nadolol 80 Mg  Tabs (Nadolol) .... Take 1 Tablet By Mouth Once A Day 6)  Meloxicam 7.5 Mg  Tabs (Meloxicam) .... Take One Tablet Daily As Needed When Arthritis Is Severe 7)  Terazosin Hcl 5 Mg  Caps (Terazosin Hcl) .Marland Kitchen.. 1 Daily 8)  Prednisone 2.5 Mg Tabs (Prednisone) .Marland Kitchen.. 1 By Mouth Twice Weekly 9)  Centrum Silver    Tabs (Multiple Vitamins-Minerals) .... Take 1 Tablet By Mouth Once A Day 10)  Premarin 0.625 Mg/gm  Crea (Estrogens, Conjugated) .... 2 Times A Week 11)  Calcium 600-200 Mg-Unit Tabs (Calcium-Vitamin D) .... Take 1 Tablet By Mouth Two Times A Day 12)  Ketoconazole 2 %  Crea (Ketoconazole) .... Apply Two Times A Day To Rash As Needed 13)  Fish Oil 1200 Mg Caps (Omega-3 Fatty Acids) .... Take 1 Tablet By Mouth Once A Day 14)  Stool Softener 100 Mg Caps (Docusate Sodium) .... As Needed 15)  Cranberry 300 Mg Tabs (Cranberry) .... Once Daily 16)  Aspirin 81 Mg Tbec (Aspirin) .... Take 1 By Mouth Once Daily 17)  Systane 0.4-0.3 % Soln (Polyethyl Glycol-Propyl Glycol) .... Instill 2 Drops in Each Eye At Night 18)  Tylenol Arthritis Pain 650 Mg Cr-Tabs (Acetaminophen) .Marland Kitchen.. 1 Tab By Mouth Three Times A Day For Arthritis Pain 19)  Tylenol Pm Extra Strength 500-25 Mg Tabs (Diphenhydramine-Apap (Sleep)) .... Take 1 Tab By Mouth At Bedtime  Allergies (verified): 1)  Penicillin 2)  Sulfa 3)  * Lisinopril  Past History:  Past Medical History: Last updated: 05/14/2009 Diverticulosis, colon Hypertension Urinary incontinence Lung fibrosis noted 2004 Osteoarthritis Lumbar spinal stenosis Diabetes mellitus, type II Anxiety  Social History: Last updated: 10/11/2006 Widowed--lives with daughter [name]) Has 2 sons in [place] Never Smoked  Alcohol use-yes--occ  Past Pulmonary History:  Pulmonary History: PFTs 7/09 >>FVC 59% - mod restriction with desaturation on exertion. BLL interstitial infiltrates not typical of IPF.  ANA neg, RA factor neg - esr 71  Pneumovax 2003  11/09 CT chest >>Pulmonary parenchymal pattern of fibrosis is most consistent with usual interstitial pneumonia (UIP).  Increase in superimposed ground-glass suggests areas of active inflammation. Destaurates on exertion  Review of Systems       The patient complains of dyspnea on exertion.  The patient denies  anorexia, fever, weight loss, weight gain, vision loss, decreased hearing, hoarseness, chest pain, syncope, peripheral edema, prolonged cough, headaches, hemoptysis, abdominal pain, melena, hematochezia, severe indigestion/heartburn, hematuria, muscle weakness, suspicious skin lesions, difficulty walking, depression, unusual weight change, abnormal bleeding, enlarged lymph nodes, and angioedema.    Vital Signs:  Patient profile:   77 year old female Height:      60 inches Weight:      152 pounds BMI:     29.79 O2 Sat:      93 % on Room air Temp:     97.5 degrees F oral Pulse rate:   67 / minute BP sitting:   140 / 78  (left arm) Cuff size:   regular  Vitals Entered By: Zackery Barefoot CMA (May 03, 2010 1:47 PM)  O2 Flow:  Room air CC: Pt states is out of Prednisone as of today. No new complaints Comments Medications reviewed with patient Verified contact number and pharmacy with patient Zackery Barefoot CMA  May 03, 2010 1:47 PM    Physical Exam  Additional Exam:  Gen. Pleasant , elderly,  ENT - no lesions, no post nasal drip Neck: No JVD, no thyromegaly, no carotid bruits Lungs: no use of accessory muscles,bibasilar crackles  Cardiovascular: Rhythm regular, heart sounds  normal, no murmurs or gallops, no peripheral edema Musculoskeletal: No deformities, no cyanosis or clubbing  Skin: intact, no rash      Impression & Recommendations:  Problem # 1:  PULMONARY FIBROSIS, CHRONIC (ICD-515) Assessment Unchanged Unchangde by history O2 evaluation next visit  - wishes to hold off for now Discussed ASCEND study - she will discuss with children  Medications Added to Medication List This Visit: 1)  Xanax 0.25 Mg Tabs (Alprazolam) .... Take 1 tab by mouth at bedtime 2)  Benicar Hct 40-25 Mg Tabs (Olmesartan medoxomil-hctz) .Marland Kitchen.. 1 daily 3)  Prednisone 2.5 Mg Tabs (Prednisone) .Marland Kitchen.. 1 by mouth twice weekly 4)  Tylenol Pm Extra Strength 500-25 Mg Tabs  (Diphenhydramine-apap (sleep)) .... Take 1 tab by mouth at bedtime  Other Orders: Est. Patient Level III ([address]) Prescription Created Electronically [id number])  Patient Instructions: 1)  Copy sent to: dr Alphonsus Sias 2)  Please schedule a follow-up appointment in 4 months with TP 3)  Talk to your kids about our study drug for pulmonary fibrosis - Our research nurses will give you a call in a few weeks Prescriptions: PREDNISONE 2.5 MG TABS (PREDNISONE) 1 by mouth twice weekly  #30 x 2   Entered and Authorized by:   Comer Locket Vassie Loll MD   Signed by:   Comer Locket Vassie Loll MD on 05/03/2010   Method used:   [facility] to        [facility] [phone]* (retail)       [address] [place], [place]  [address]       Ph: [phone] or  [phone]       Fax: [phone]   RxID:   [id number]

## 2010-06-25 ENCOUNTER — Telehealth (INDEPENDENT_AMBULATORY_CARE_PROVIDER_SITE_OTHER): Payer: Self-pay | Admitting: *Deleted

## 2010-06-30 ENCOUNTER — Other Ambulatory Visit: Payer: Self-pay | Admitting: Dermatology

## 2010-07-01 NOTE — Progress Notes (Signed)
Summary: cough<<<tessalon perle RX sent  Phone Note Call from Patient Call back at Home Phone [phone]   Caller: Patient Call For: Vassie Loll Summary of Call: Spoke with pt.  She is requesting refill on tessalon.  Has cough in the am and states that the tessalon is the only thing that helps.  She states that the cough is actually better. She wants RA to know that her breathing has improved as well. She is just wanting refill to help with her occ morning cough. Pls advise if okay to refill. Thanks! [facility] [place] Initial call taken by: Vernie Murders,  June 25, 2010 10:11 AM  Follow-up for Phone Call        OK - 200 mg  three times a day as needed x 1 month x 3 refills Follow-up by: Comer Locket Vassie Loll MD,  June 25, 2010 2:26 PM  Additional Follow-up for Phone Call Additional follow up Details #1::        Pt is aware of refill for tessalon perles.Michel Bickers [facility]  June 25, 2010 2:36 PM Additional Follow-up by: Michel Bickers CMA,  June 25, 2010 2:36 PM    New/Updated Medications: TESSALON 200 MG CAPS (BENZONATATE) 1 by mouth three times daily as needed for cough Prescriptions: TESSALON 200 MG CAPS (BENZONATATE) 1 by mouth three times daily as needed for cough  #90 x 3   Entered by:   Michel Bickers CMA   Authorized by:   Comer Locket. Vassie Loll MD   Signed by:   Michel Bickers CMA on 06/25/2010   Method used:   [facility]ronically to        [facility] [phone]* (retail)       [address] [place], [place]  [address]       Ph: [phone] or [phone]       Fax: [phone]   RxID:   [id number]

## 2010-07-12 ENCOUNTER — Other Ambulatory Visit: Payer: Self-pay | Admitting: Dermatology

## 2010-07-19 ENCOUNTER — Telehealth: Payer: Self-pay | Admitting: Internal Medicine

## 2010-07-19 ENCOUNTER — Telehealth (INDEPENDENT_AMBULATORY_CARE_PROVIDER_SITE_OTHER): Payer: Self-pay | Admitting: *Deleted

## 2010-07-20 ENCOUNTER — Encounter: Payer: Self-pay | Admitting: Family Medicine

## 2010-07-20 ENCOUNTER — Ambulatory Visit (INDEPENDENT_AMBULATORY_CARE_PROVIDER_SITE_OTHER): Payer: Medicare PPO | Admitting: Family Medicine

## 2010-07-20 DIAGNOSIS — R21 Rash and other nonspecific skin eruption: Secondary | ICD-10-CM

## 2010-07-27 NOTE — Assessment & Plan Note (Signed)
Summary: RASH   Vital Signs:  Patient profile:   77 year old female Weight:      149 pounds Temp:     98.1 degrees F oral Pulse rate:   72 / minute Pulse rhythm:   regular BP sitting:   160 / 80  (left arm) Cuff size:   large  Vitals Entered By: Selena Batten Dance CMA Duncan Dull) (July 20, 2010 3:36 PM) CC: Rash all over body since this AM   History of Present Illness: CC: rash  woke up this morning and took shower, noted rash all over body afterwadrs, started at 10am.  Starting to itch some.    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  26 stitches.  Came back cancer free edges.  Was on antibiotic and thinks it was a cephalosporin 300mg  (blue pill), stopped taking yesterday, 5d course.  called pharmacy - was clindamycin.  No other new medicines.  No new lotions, soap (using dove), shampoo.  did start using tide total care (previously using cold water tide).  No abd pain, n/v, no swelling, SOB, wheezing.  No fevers/chills.  No oral lesions.  Similar rash like this in past, unsure what it was due to.    on prednisone twice weekly per pulm.  started on tessalon perls for months now (latest new med).  Current Medications (verified): 1)  Xanax 0.25 Mg Tabs (Alprazolam) .... Take 1 Tab By Mouth At Bedtime 2)  Benicar Hct 40-25 Mg Tabs (Olmesartan Medoxomil-Hctz) .Marland Kitchen.. 1 Daily 3)  Metformin Hcl 500 Mg Tabs (Metformin Hcl) .Marland Kitchen.. 1 Tab Two Times A Day Before Breakfast and Supper 4)  Cartia Xt 120 Mg Cp24 (Diltiazem Hcl Coated Beads) .Marland Kitchen.. 1 Daily 5)  Nadolol 80 Mg  Tabs (Nadolol) .... Take 1 Tablet By Mouth Once A Day 6)  Meloxicam 7.5 Mg  Tabs (Meloxicam) .... Take One Tablet Daily As Needed When Arthritis Is Severe 7)  Terazosin Hcl 5 Mg  Caps (Terazosin Hcl) .Marland Kitchen.. 1 Daily 8)  Prednisone 2.5 Mg Tabs (Prednisone) .Marland Kitchen.. 1 By Mouth Twice Weekly 9)  Centrum Silver   Tabs (Multiple Vitamins-Minerals) .... Take 1 Tablet By Mouth Once A Day 10)  Premarin 0.625 Mg/gm  Crea (Estrogens, Conjugated)  .... 2 Times A Week 11)  Calcium 600-200 Mg-Unit Tabs (Calcium-Vitamin D) .... Take 1 Tablet By Mouth Two Times A Day 12)  Ketoconazole 2 %  Crea (Ketoconazole) .... Apply Two Times A Day To Rash As Needed 13)  Fish Oil 1200 Mg Caps (Omega-3 Fatty Acids) .... Take 1 Tablet By Mouth Once A Day 14)  Stool Softener 100 Mg Caps (Docusate Sodium) .... As Needed 15)  Cranberry 300 Mg Tabs (Cranberry) .... Once Daily 16)  Aspirin 81 Mg Tbec (Aspirin) .... Take 1 By Mouth Once Daily 17)  Systane 0.4-0.3 % Soln (Polyethyl Glycol-Propyl Glycol) .... Instill 2 Drops in Each Eye At Night 18)  Tylenol Arthritis Pain 650 Mg Cr-Tabs (Acetaminophen) .Marland Kitchen.. 1 Tab By Mouth Three Times A Day For Arthritis Pain 19)  Tylenol Pm Extra Strength 500-25 Mg Tabs (Diphenhydramine-Apap (Sleep)) .... Take 1 Tab By Mouth At Bedtime 20)  Tessalon 200 Mg Caps (Benzonatate) .Marland Kitchen.. 1 By Mouth Three Times Daily As Needed For Cough  Allergies: 1)  ! * Clindamycin 2)  Penicillin 3)  Sulfa 4)  * Lisinopril  Past History:  Past Medical History: Last updated: 05/14/2009 Diverticulosis, colon Hypertension Urinary incontinence Lung fibrosis noted 2004 Osteoarthritis Lumbar spinal stenosis Diabetes mellitus, type II Anxiety  Social History: Last updated: 10/11/2006 Widowed--lives with daughter [name]) Has 2 sons in [place] Never Smoked Alcohol use-yes--occ  Review of Systems       per HPI  Physical Exam  General:  alert and normal appearance.   Mouth:  pharynx pink and moist.  no oral lesions appreciated Skin:  diffuse morbiliform faintly erythematous maculopapular rash throughout trunk, abd, back, down thighs to mid calf and arms to elbows.  central coalescence of rash to continuous red macule on abd/back, minimally pruritic currently.   Impression & Recommendations:  Problem # 1:  SKIN RASH (ICD-782.1) Assessment New anticipate 2/2 clinda allergy, added to list.  discussed red flags to seek urgent  care.  hydroxyzine for itch as needed.    Her updated medication list for this problem includes:    Ketoconazole 2 % Crea (Ketoconazole) .Marland Kitchen... Apply two times a day to rash as needed  Complete Medication List: 1)  Xanax 0.25 Mg Tabs (Alprazolam) .... Take 1 tab by mouth at bedtime 2)  Benicar Hct 40-25 Mg Tabs (Olmesartan medoxomil-hctz) .Marland Kitchen.. 1 daily 3)  Metformin Hcl 500 Mg Tabs (Metformin hcl) .Marland Kitchen.. 1 tab two times a day before breakfast and supper 4)  Cartia Xt 120 Mg Cp24 (Diltiazem hcl coated beads) .Marland Kitchen.. 1 daily 5)  Nadolol 80 Mg Tabs (Nadolol) .... Take 1 tablet by mouth once a day 6)  Meloxicam 7.5 Mg Tabs (Meloxicam) .... Take one tablet daily as needed when arthritis is severe 7)  Terazosin Hcl 5 Mg Caps (Terazosin hcl) .Marland Kitchen.. 1 daily 8)  Prednisone 2.5 Mg Tabs (Prednisone) .Marland Kitchen.. 1 by mouth twice weekly 9)  Centrum Silver Tabs (Multiple vitamins-minerals) .... Take 1 tablet by mouth once a day 10)  Premarin 0.625 Mg/gm Crea (Estrogens, conjugated) .... 2 times a week 11)  Calcium 600-200 Mg-unit Tabs (Calcium-vitamin d) .... Take 1 tablet by mouth two times a day 12)  Ketoconazole 2 % Crea (Ketoconazole) .... Apply two times a day to rash as needed 13)  Fish Oil 1200 Mg Caps (Omega-3 fatty acids) .... Take 1 tablet by mouth once a day 14)  Stool Softener 100 Mg Caps (Docusate sodium) .... As needed 15)  Cranberry 300 Mg Tabs (Cranberry) .... Once daily 16)  Aspirin 81 Mg Tbec (Aspirin) .... Take 1 by mouth once daily 17)  Systane 0.4-0.3 % Soln (Polyethyl glycol-propyl glycol) .... Instill 2 drops in each eye at night 18)  Tylenol Arthritis Pain 650 Mg Cr-tabs (Acetaminophen) .Marland Kitchen.. 1 tab by mouth three times a day for arthritis pain 19)  Tylenol Pm Extra Strength 500-25 Mg Tabs (Diphenhydramine-apap (sleep)) .... Take 1 tab by mouth at bedtime 20)  Tessalon 200 Mg Caps (Benzonatate) .Marland Kitchen.. 1 by mouth three times daily as needed for cough 21)  Hydroxyzine Hcl 25 Mg Tabs (Hydroxyzine hcl)  .... 1/2 to 1 pill levery 6-8 hours for itching  Patient Instructions: 1)  I think this is reaction to antibiotic you took (clindamycin). 2)  I'm glad you've finished it. 3)  If itching starts, may take hydroxyzine (watch out may make you sleepy so take when at home). 4)  Watch out for abdominal pain, nausea, shortness of breath or swelling if that happens seek medical care. 5)  Call us with questions.  Prescriptions: HYDROXYZINE HCL 25 MG TABS (HYDROXYZINE HCL) 1/2 to 1 pill levery 6-8 hours for itching  #30 x 0   Entered and Authorized by:   Eustaquio Boyden  MD   Signed by:  Eustaquio Boyden  MD on 07/20/2010   Method used:   [facility] to        [facility] [phone]* (retail)       [address] [place], [place]  [address]       Ph: [phone] or [phone]       Fax: [phone]   RxID:   [id number]    Orders Added: 1)  Est. Patient Level III [956[address]]    Current Allergies (reviewed today): ! * CLINDAMYCIN PENICILLIN SULFA * LISINOPRIL

## 2010-07-27 NOTE — Progress Notes (Signed)
Summary: pt had melanoma removed  Phone Note Call from Patient Call back at Home Phone [phone] P PH     Caller: Patient Call For: Cindee Salt MD Summary of Call: Pt just had a melanoma removed from her scalp.  She just wanted to let you know, all of the cancer was removed.              Initial call taken by: Lowella Petties CMA, AAMA,  July 19, 2010 4:46 PM  Follow-up for Phone Call        Good to hear it was successfully removed Follow-up by: Cindee Salt MD,  July 21, 2010 9:26 AM      Past History:  Past Surgical History: DEXA normal  10/2000 partial colectomy 1992 Hysterectomy 1972 Fx humerus  repair  2001 Eyelid repair 1990 L  total knee 11/2005  L3-4, L4-5 decompressive laminectomy--12/08--Cram Melanoma removed from scalp   3/12

## 2010-07-27 NOTE — Progress Notes (Signed)
Summary: whooping cough vaccine -  Phone Note Call from Patient Call back at Home Phone [phone] P PH     Caller: Patient Call For: ALVA Summary of Call: Patient phoned stated that the [facility] Dept is giving free Whooping Cough vaccines and she wants to know if Dr Vassie Loll thinks that he should have one. She can be reached at [phone] Initial call taken by: Vedia Coffer,  July 19, 2010 5:08 PM  Follow-up for Phone Call        RA, please advise if you think pt needs to get vaccine or not.  Thank you.  Aundra Millet Reynolds LPN  July 19, 2954 5:40 PM   NOT necessary Follow-up by: Comer Locket. Vassie Loll MD,  July 20, 2010 2:36 PM  Additional Follow-up for Phone Call Additional follow up Details #1::        [facility] Gweneth Dimitri RN  July 20, 2010 2:41 PM  [facility] Gweneth Dimitri RN  July 21, 2010 10:22 AM      Additional Follow-up for Phone Call Additional follow up Details #2::    Spoke with pt and notified of the above recs per RA.  Pt verbalized understanding.  Follow-up by: Vernie Murders,  July 21, 2010 2:37 PM

## 2010-08-02 ENCOUNTER — Telehealth: Payer: Self-pay | Admitting: Internal Medicine

## 2010-08-12 NOTE — Progress Notes (Signed)
Summary: xanax   Phone Note Refill Request Call back at Home Phone [phone] Message from:  Patient on August 02, 2010 2:06 PM  Refills Requested: Medication #1:  XANAX 0.25 MG TABS Take 1 tab by mouth at bedtime Wants written script for mail order   Method Requested: Pick up at Office Initial call taken by: Melody Comas,  August 02, 2010 2:06 PM  Follow-up for Phone Call        Rx written Follow-up by: Cindee Salt MD,  August 02, 2010 2:10 PM  Additional Follow-up for Phone Call Additional follow up Details #1::        left message on machine that rx ready for pick-up  Additional Follow-up by: Mervin Hack CMA Duncan Dull),  August 02, 2010 5:18 PM    Prescriptions: Prudy Feeler 0.25 MG TABS (ALPRAZOLAM) Take 1 tab by mouth at bedtime  #90 x 1   Entered and Authorized by:   Cindee Salt MD   Signed by:   Cindee Salt MD on 08/02/2010   Method used:   Print then Give to Patient   RxID:   [id number]

## 2010-08-24 LAB — GLUCOSE, CAPILLARY: Glucose-Capillary: 128 mg/dL — ABNORMAL HIGH (ref 70–99)

## 2010-08-27 ENCOUNTER — Encounter: Payer: Self-pay | Admitting: Adult Health

## 2010-08-30 ENCOUNTER — Encounter: Payer: Self-pay | Admitting: Adult Health

## 2010-08-30 ENCOUNTER — Ambulatory Visit (INDEPENDENT_AMBULATORY_CARE_PROVIDER_SITE_OTHER): Payer: Medicare PPO | Admitting: Adult Health

## 2010-08-30 DIAGNOSIS — J8410 Pulmonary fibrosis, unspecified: Secondary | ICD-10-CM

## 2010-08-30 NOTE — Patient Instructions (Signed)
Continue on same meds.  follow up Dr. Vassie Loll  In 4 months and As needed

## 2010-08-31 ENCOUNTER — Ambulatory Visit: Payer: Self-pay | Admitting: Adult Health

## 2010-08-31 ENCOUNTER — Encounter: Payer: Self-pay | Admitting: Adult Health

## 2010-08-31 NOTE — Assessment & Plan Note (Signed)
Currently compensated on present regimen- clinically stable. Xray last year with chronic changes - no increased fibrosis seen.  Not candidate for ASCEND trial  Will hold steroids at this dose for now- on minimal prednisone 2.5mg  twice weekly  Consider on return in 4 months if we are going to taper completely off or not.

## 2010-08-31 NOTE — Progress Notes (Signed)
  Subjective:    Patient ID: Erica Hansen, female    DOB: [birth year], 77 y.o.   MRN: [id number]  HPI 87/F, never smoker for FU of pulmonary fibrosis, on empiric steroids since 2/10 with ceiling of 20 mg  Baseline On 2.5 mg twice/ wk currently   January 08, 2009  Dropped prednisone to 10 mg in 7/10 & 5 mg in 8/10. Now c/o dry cough x 2 weeks & increasing dyspnea esp on climbing steps. Easy bruising prior to taking steroids. .   November 04, 2009 2:09 PM  chest cold towards end of may, now better. breathing is the same. States cough returned but since she has started benzonatate and increased prednisone and cough seems to be getting better. Cough is nonprod now   May 03, 2010 2:02 PM  Breathing has been at baseline, still gets winded with activity. No increased cough or congestion. Has been on a very slow steroid taper, currently on 2. 5 mg twice weekly. She does need refill on steroid. With steroid decrease from last OV, did not see increase in cough or dyspnea. - no changes    08/30/10 Follow up  Pt returns for follow up . Says she is doing well. Currently on prednisone 2.5mg  twice weekly with no flare in symptoms since last ov.  She did not qualify for the ASCEND Trial due to age limit. Minimal cough . Dyspnea at baseline.  NO discolored mucus. Daughter is with her today.     Review of Systems Constitutional:   No  weight loss, night sweats,  Fevers, chills  HEENT:   No headaches,  Difficulty swallowing,  Tooth/dental problems, or  Sore throat,                No sneezing, itching, ear ache, nasal congestion, post nasal drip,   CV:  No chest pain,  Orthopnea, PND, swelling in lower extremities, anasarca, dizziness, palpitations, syncope.   GI  No heartburn, indigestion, abdominal pain, nausea, vomiting, diarrhea, change in bowel habits, loss of appetite, bloody stools.   Resp:  No excess mucus, no productive cough,  No non-productive cough,  No coughing up of blood.  No change in  color of mucus.  No wheezing.  No chest wall deformity  Skin: no rash or lesions.  GU: no dysuria, change in color of urine, no urgency or frequency.  No flank pain, no hematuria   MS:  No joint pain or swelling.  No decreased range of motion.    Psych:  No change in mood or affect. No depression or anxiety.  No memory loss.         Objective:   Physical Exam GEN: A/Ox3; pleasant , NAD, well nourished , elderly   HEENT:  [place]/AT,  EACs-clear, TMs-wnl, NOSE-clear, THROAT-clear, no lesions, no postnasal drip or exudate noted.   NECK:  Supple w/ fair ROM; no JVD; normal carotid impulses w/o bruits; no thyromegaly or nodules palpated; no lymphadenopathy.  RESP  Bibasilar crackles   CARD:  RRR, no m/r/g  , no peripheral edema, pulses intact, no cyanosis or clubbing.  GI:   Soft & nt; nml bowel sounds; no organomegaly or masses detected.  Musco: Warm bil, no deformities or joint swelling noted.   Neuro: alert, no focal deficits noted.    Skin: Warm, no lesions or rashes          Assessment & Plan:

## 2010-09-02 ENCOUNTER — Ambulatory Visit: Payer: Self-pay | Admitting: Adult Health

## 2010-09-28 NOTE — Op Note (Signed)
NAMEJAKIRA, Erica Hansen                 ACCOUNT NO.:  [id number]   MEDICAL RECORD NO.:  [id number]          PATIENT TYPE:  INP   LOCATION:  2899                         FACILITY:  [facility]   PHYSICIAN:  Donalee Citrin, M.D.        DATE OF BIRTH:  [birth year]   DATE OF PROCEDURE:  05/15/2007  DATE OF DISCHARGE:                               OPERATIVE REPORT   PREOPERATIVE DIAGNOSIS:  Severe lumbar spinal stenosis L3-4 and L4-5  with bilateral L3 and L4 radiculopathies.   PROCEDURE:  Decompressive laminectomy bilaterally L3-4 and L4-5 with  bilateral foraminotomies at L3-4 and  L5 nerve roots.   SURGEON:  Donalee Citrin, M.D.   ANESTHESIA.:  General endotracheal.   HISTORY OF PRESENT ILLNESS:  The patient is a very pleasant 84-year  female who has progressive worsening back and bilateral leg pain with  pain radiating down the outside of her thighs to her quads above her  knees as well as occasionally in front of her shins.  The patient had  weakness on quadriceps and squatting preoperatively.  MRI scan showed  degenerative spondylolisthesis mild at L3-4 and L4-5 with severe spinal  stenosis due to combination of facet arthropathy and ligamentous  hypertrophy with what would appear to be complete myelographic  block at  L4-5 by MRI criteria.  The patient had failed all forms of conservative  treatment, had weakness in her legs and progressive worsening pain.  The  patient was recommended decompressive laminectomies.  Risks, benefits of  the operation were explained and the patient understands and wishes to  proceed forward.   The patient was taken to the OR and induced under general anesthesia.  Patient was placed prone on the Wilson frame.  Back was prepped and  draped in the usual fashion.  Preoperative x-ray localized the L3  spinous process and after infiltration with 10 mL lidocaine with  epinephrine, a midline incision made and Bovie electrocautery was used  to take it down through the  subcutaneous tissue and subperiosteal  dissection carried out of lamina of L3-4 and L5 bilaterally.  At this  point, intraoperative x-ray confirmed localization at the appropriate  level and the L3 and L4 spinous processes were completely removed and  the center part of the lamina as well as bilateral was drilled down.  There was marked facet arthropathy and thickening of the ligament.  The  undersurface of the lamina was under bitten exposing ligament flavum.  Using a four Penfield the plane around the ligament from the  undersurface of the lamina was developed and removed piecemeal fashion  with the 2- and 3-mm  Kerrison punch performing a central decompression.  There was noted to be tremendously marked spinal stenosis at 3-4 but  worse of 4-5 displacing thecal sac causing infolding of the dura  centrally with severe stenosis of both these levels.  Using a four  Penfield and freeing up the undersurface of the medial facet complex  from the dura, the aggressive under biting of the medial canal and  medial border of the medial facet to decompress the  lateral aspect of  the thecal sac.  Care was taken to not violate the joint to help  preserve stability.  After the L3-4 level was completely compressed with  underbiting in this facet, the L3 and the L4 nerve roots and those  foramen were also widely patent.  Attention was taken to 4-5.  Marching  centrally downward again severe ligamentous hypertrophy and facet  arthropathy was noted causing marked hourglass compression of thecal  sac.  This was all dissected free and removed piecemeal in fashion  decompressing the central canal.  The L5 nerve root was subsequently  decompressed as well.  The disk spaces were inspected.  There was noted  be some bulges, however, they due to the central decompression and  preoperative degenerative spondylolisthesis it was decided not to enter  the disk space and help preserve some of the stability  anteriorly.  Adequate decompression of the nerve roots had been achieved posteriorly  as well.  After all foramina were inspected with a hockey stick and  coronary dilator and easily accepted this instrument, the wound was then  copiously irrigated.  Meticulous hemostasis was maintained.  Gelfoam was  laid atop the dura.  A medium Hemovac drain  was placed and wound was  closed in layers with interrupted Vicryl with running 4-0 subcuticular.  Benzoin and Steri-Strips were applied.  The patient went to the recovery  room in stable condition.  At the end of the case, needle and sponge  counts were correct.           ______________________________  Donalee Citrin, M.D.     GC/MEDQ  D:  05/15/2007  T:  05/16/2007  Job:  [id number]

## 2010-10-01 NOTE — Discharge Summary (Signed)
NAMETAKYAH, CIARAMITARO                 ACCOUNT NO.:  [id number]   MEDICAL RECORD NO.:  [id number]          PATIENT TYPE:  INP   LOCATION:  3010                         FACILITY:  [facility]   PHYSICIAN:  Donalee Citrin, M.D.        DATE OF BIRTH:  1922-08-04   DATE OF ADMISSION:  05/15/2007  DATE OF DISCHARGE:  05/18/2007                               DISCHARGE SUMMARY   ADMISSION DIAGNOSIS:  Lumbar spinal stenosis.   DISCHARGE DIAGNOSIS:  Lumbar spinal stenosis.   PROCEDURE:  Decompressive laminectomy from L3-L4 to L4-L5.   HISTORY OF PRESENT ILLNESS:  The patient is an 77 year old female who  was admitted as an __________  and underwent the aforementioned  procedure.  Postoperative, she did very well, recovering on the floor.  The patient is progressing and mobilized with physical therapy.  Had  significant improvement in her leg pain.  Was ambulating, voiding  spontaneously and able to be discharged home on hospital day 3.  At the  time of discharge, the patient was neurologically intact with no focal  motor deficits, ambulating and voiding spontaneously.   FOLLOW UP:  Scheduled to follow up in 2 weeks.           ______________________________  Donalee Citrin, M.D.     GC/MEDQ  D:  05/30/2007  T:  05/30/2007  Job:  [id number]

## 2010-10-23 ENCOUNTER — Encounter: Payer: Self-pay | Admitting: Internal Medicine

## 2010-10-25 ENCOUNTER — Ambulatory Visit (INDEPENDENT_AMBULATORY_CARE_PROVIDER_SITE_OTHER): Payer: Medicare PPO | Admitting: Internal Medicine

## 2010-10-25 ENCOUNTER — Encounter: Payer: Self-pay | Admitting: Internal Medicine

## 2010-10-25 VITALS — BP 148/70 | HR 64 | Temp 98.3°F | Ht 60.0 in | Wt 146.0 lb

## 2010-10-25 DIAGNOSIS — M48061 Spinal stenosis, lumbar region without neurogenic claudication: Secondary | ICD-10-CM

## 2010-10-25 DIAGNOSIS — J8410 Pulmonary fibrosis, unspecified: Secondary | ICD-10-CM

## 2010-10-25 DIAGNOSIS — I10 Essential (primary) hypertension: Secondary | ICD-10-CM

## 2010-10-25 DIAGNOSIS — E1129 Type 2 diabetes mellitus with other diabetic kidney complication: Secondary | ICD-10-CM

## 2010-10-25 DIAGNOSIS — F411 Generalized anxiety disorder: Secondary | ICD-10-CM

## 2010-10-25 LAB — BASIC METABOLIC PANEL
BUN: 49 mg/dL — ABNORMAL HIGH (ref 6–23)
CO2: 28 mEq/L (ref 19–32)
Calcium: 9.4 mg/dL (ref 8.4–10.5)
Chloride: 104 mEq/L (ref 96–112)
Creatinine, Ser: 1.5 mg/dL — ABNORMAL HIGH (ref 0.4–1.2)

## 2010-10-25 LAB — CBC WITH DIFFERENTIAL/PLATELET
Eosinophils Relative: 4.1 % (ref 0.0–5.0)
Monocytes Relative: 7.5 % (ref 3.0–12.0)
Neutrophils Relative %: 67.9 % (ref 43.0–77.0)
Platelets: 224 10*3/uL (ref 150.0–400.0)
WBC: 8.2 10*3/uL (ref 4.5–10.5)

## 2010-10-25 LAB — HEPATIC FUNCTION PANEL
ALT: 15 U/L (ref 0–35)
AST: 21 U/L (ref 0–37)
Albumin: 4 g/dL (ref 3.5–5.2)

## 2010-10-25 LAB — TSH: TSH: 3.04 u[IU]/mL (ref 0.35–5.50)

## 2010-10-25 MED ORDER — TERAZOSIN HCL 5 MG PO CAPS: 5.0000 mg | ORAL_CAPSULE | Freq: Every evening | ORAL | Status: DC

## 2010-10-25 MED ORDER — OLMESARTAN MEDOXOMIL-HCTZ 40-12.5 MG PO TABS: 1.0000 | ORAL_TABLET | Freq: Every day | ORAL | Status: DC

## 2010-10-25 MED ORDER — DILTIAZEM HCL ER COATED BEADS 120 MG PO CP24: 120.0000 mg | ORAL_CAPSULE | Freq: Every day | ORAL | Status: DC

## 2010-10-25 NOTE — Assessment & Plan Note (Signed)
Does okay with tylenol Has now only been taking meloxicam rarely

## 2010-10-25 NOTE — Assessment & Plan Note (Signed)
Seems to still have good control Lab Results  Component Value Date   HGBA1C 6.6* 04/19/2010   Will recheck today

## 2010-10-25 NOTE — Progress Notes (Signed)
Subjective:    Patient ID: Erica Hansen, female    DOB: 09/21/1922, 77 y.o.   MRN: [id number]  HPI Has been doing okay Has had some concerns about her kidneys Worried about dialysis Lab Results  Component Value Date   CREATININE 1.2 04/19/2010   Checks sugars twice a week Most are under 100 and all are under 130 No hypoglycemic reactions  Went off the road driving once Wound up in someone's yard Not sure what happens---??fainted It was at night and raining No chest pain  No SOB No sig cough Still on low dose prednisone  Anxiety controlled Only takes the xanax at night  Not doing aquacise --prefers to walk now Still with back pain--esp if she overdoes it Usually better with rest and uses tylenol Tries to avoid the meloxicam  Current Outpatient Prescriptions on File Prior to Visit  Medication Sig Dispense Refill  . ALPRAZolam (XANAX) 0.25 MG tablet Take 0.25 mg by mouth at bedtime as needed.        Marland Kitchen aspirin 81 MG tablet Take 81 mg by mouth daily.        . Calcium Carbonate-Vitamin D (CALCIUM 600+D) 600-200 MG-UNIT TABS Take 1 tablet by mouth 2 (two) times daily.        Marland Kitchen conjugated estrogens (PREMARIN) vaginal cream Place vaginally 2 (two) times a week.        . Cranberry (YL CRANBERRY CONCENTRATE) 300 MG tablet Take 300 mg by mouth daily.        Marland Kitchen diltiazem (CARDIZEM CD) 120 MG 24 hr capsule Take 120 mg by mouth daily.        Marland Kitchen docusate sodium (COLACE) 100 MG capsule Take 100 mg by mouth 2 (two) times daily as needed.        . metformin (FORTAMET) 500 MG (OSM) 24 hr tablet Take 500 mg by mouth 2 (two) times daily with a meal.        . Multiple Vitamins-Minerals (CENTRUM SILVER PO) Take 1 capsule by mouth daily.        . nadolol (CORGARD) 80 MG tablet Take 80 mg by mouth daily.        Marland Kitchen olmesartan-hydrochlorothiazide (BENICAR HCT) 40-12.5 MG per tablet Take 1 tablet by mouth daily.        . Omega-3 Fatty Acids (FISH OIL) 1200 MG CAPS Take 1 capsule by mouth daily.         Bertram Gala Glycol-Propyl Glycol (SYSTANE) 0.4-0.3 % SOLN Place 2 drops into both eyes at bedtime.       . predniSONE (DELTASONE) 2.5 MG tablet Take 1 tablet by mouth every Tuesday and Saturday       . terazosin (HYTRIN) 5 MG capsule Take 5 mg by mouth every evening.       Marland Kitchen DISCONTD: acetaminophen (TYLENOL EX ST ARTHRITIS PAIN) 500 MG tablet Take 500 mg by mouth as needed.        Marland Kitchen DISCONTD: benzonatate (TESSALON) 200 MG capsule Take 200 mg by mouth 3 (three) times daily as needed.        Marland Kitchen DISCONTD: diphenhydramine-acetaminophen (TYLENOL PM) 25-500 MG TABS Take 1 tablet by mouth at bedtime as needed.        Marland Kitchen DISCONTD: hydrOXYzine (VISTARIL) 25 MG capsule Take 25 mg by mouth 4 (four) times daily as needed.        Marland Kitchen DISCONTD: ketoconazole (NIZORAL) 2 % cream Apply topically 2 (two) times daily as needed.  Past Medical History  Diagnosis Date  . Diverticulosis   . Unspecified essential hypertension   . Osteoarthritis   . Type II or unspecified type diabetes mellitus without mention of complication, not stated as uncontrolled   . Anxiety states   . Lung fibrosis   . Urinary incontinence     Past Surgical History  Procedure Date  . Abdominal hysterectomy   . Replacement total knee   . Laminectomy   . Humerus fracture surgery     Family History  Problem Relation Age of Onset  . Colon cancer Father     History   Social History  . Marital Status: Widowed    Spouse Name: N/A    Number of Children: 3  . Years of Education: N/A   Occupational History  .     Social History Main Topics  . Smoking status: Never Smoker   . Smokeless tobacco: Not on file  . Alcohol Use: Yes     occasional  . Drug Use: Not on file  . Sexually Active: Not on file   Other Topics Concern  . Not on file   Social History Narrative   lives with daughter Erica Hansen) Has 2 sons in [place]   Review of Systems Continues to sleep okay--uses the xanax still Melanoma removed from head  in the past 6 months    Objective:   Physical Exam  Constitutional: She appears well-developed and well-nourished. No distress.  Neck: Normal range of motion. Neck supple. No thyromegaly present.  Cardiovascular: Normal rate, regular rhythm and normal heart sounds.  Exam reveals no gallop.   No murmur heard. Pulmonary/Chest: Effort normal and breath sounds normal. No respiratory distress. She has no wheezes. She has no rales.  Musculoskeletal: Normal range of motion. She exhibits no tenderness.       ?trace edema  Lymphadenopathy:    She has no cervical adenopathy.  Psychiatric: She has a normal mood and affect. Her behavior is normal. Judgment and thought content normal.          Assessment & Plan:

## 2010-10-25 NOTE — Assessment & Plan Note (Signed)
Still on low dose prednisone Has follow up with Dr Vassie Loll coming up in a few months May be able to try weaning off the prednisone

## 2010-10-25 NOTE — Assessment & Plan Note (Signed)
Does okay just using xanax for sleep

## 2010-10-25 NOTE — Assessment & Plan Note (Signed)
BP Readings from Last 3 Encounters:  10/25/10 148/70  08/30/10 132/60  07/20/10 160/80   Reasonable BP control Will check labs today

## 2010-10-29 ENCOUNTER — Telehealth: Payer: Self-pay | Admitting: *Deleted

## 2010-10-29 ENCOUNTER — Encounter: Payer: Self-pay | Admitting: *Deleted

## 2010-10-29 NOTE — Telephone Encounter (Signed)
Message copied by Sueanne Margarita on Fri Oct 29, 2010  1:55 PM ------      Message from: Tillman Abide I      Created: Thu Oct 28, 2010  9:08 AM       Please call      Blood work generally okay      Kidney tests are fairly stable--no sig change from 1-2 years ago      Diabetes control remains good with HgbA1c of 6.7%      Blood count shows mild anemia but still better than a year ago      Liver and thyroid tests are normal

## 2010-10-29 NOTE — Telephone Encounter (Signed)
Left message this morning asking pt to return my call.

## 2010-11-02 NOTE — Telephone Encounter (Signed)
Patient not calling back, sent letter to pt's home address. Left another message advising pt to call if any questions.

## 2010-12-10 ENCOUNTER — Encounter: Payer: Self-pay | Admitting: Internal Medicine

## 2011-01-20 ENCOUNTER — Ambulatory Visit (INDEPENDENT_AMBULATORY_CARE_PROVIDER_SITE_OTHER): Payer: Medicare PPO | Admitting: Pulmonary Disease

## 2011-01-20 ENCOUNTER — Encounter: Payer: Self-pay | Admitting: Pulmonary Disease

## 2011-01-20 ENCOUNTER — Ambulatory Visit (INDEPENDENT_AMBULATORY_CARE_PROVIDER_SITE_OTHER)
Admission: RE | Admit: 2011-01-20 | Discharge: 2011-01-20 | Disposition: A | Discharge status: Home / Self Care | Payer: Medicare PPO | Source: Ambulatory Visit | Attending: Pulmonary Disease | Admitting: Pulmonary Disease

## 2011-01-20 VITALS — BP 170/84 | HR 64 | Temp 97.4°F | Ht 60.0 in | Wt 152.0 lb

## 2011-01-20 DIAGNOSIS — J8410 Pulmonary fibrosis, unspecified: Secondary | ICD-10-CM

## 2011-01-20 DIAGNOSIS — Z23 Encounter for immunization: Secondary | ICD-10-CM

## 2011-01-20 NOTE — Patient Instructions (Signed)
Recheck BP Chest xray today Flu shot Decrease prednisone to 2.5 mg every Saturday, In October STOP

## 2011-01-20 NOTE — Progress Notes (Signed)
  Subjective:    Patient ID: Erica Hansen, female    DOB: [birth year], 77 y.o.   MRN: [id number]  HPI 88/F, never smoker for FU of pulmonary fibrosis, on empiric steroids for cough since 2/10 with ceiling of 20 mg  Baseline On 2.5 mg twice/ wk currently   January 08, 2009  Dropped prednisone to 10 mg in 7/10 & 5 mg in 8/10. Now c/o dry cough x 2 weeks & increasing dyspnea esp on climbing steps. Easy bruising prior to taking steroids. .   01/20/2011 Pt returns for follow up . Says she is doing well. Currently on prednisone 2.5mg  twice weekly with no flare in symptoms since last ov.  She did not qualify for the ASCEND Trial due to age limit. Minimal cough . Dyspnea at baseline.  NO discolored mucus. Daughter is with her today.  CXR - stable fibrosis      Review of Systems Patient denies significant dyspnea,cough, hemoptysis,  chest pain, palpitations, pedal edema, orthopnea, paroxysmal nocturnal dyspnea, lightheadedness, nausea, vomiting, abdominal or  leg pains       Objective:   Physical Exam Gen. Pleasant, well-nourished, in no distress ENT - no lesions, no post nasal drip Neck: No JVD, no thyromegaly, no carotid bruits Lungs: no use of accessory muscles, no dullness to percussion, BL 1/2 rales, no rhonchi  Cardiovascular: Rhythm regular, heart sounds  normal, no murmurs or gallops, no peripheral edema Musculoskeletal: No deformities, no cyanosis or clubbing         Assessment & Plan:

## 2011-01-21 NOTE — Assessment & Plan Note (Signed)
Taper prednisone to off CXR stable Did not qualify for ASCEND trial due to age Follow oximetry every visit FLu shot today

## 2011-01-25 ENCOUNTER — Other Ambulatory Visit: Payer: Self-pay | Admitting: *Deleted

## 2011-01-25 MED ORDER — KETOCONAZOLE 2 % EX CREA: 1.0000 "application " | TOPICAL_CREAM | Freq: Two times a day (BID) | CUTANEOUS | Status: DC | PRN

## 2011-01-25 NOTE — Telephone Encounter (Signed)
Fax asking for refill of Ketoconazole 2% cream to be used BID prn last refilled 05/14/2009. Please advise

## 2011-01-25 NOTE — Telephone Encounter (Signed)
Okay to refill #60gm x 3 

## 2011-01-25 NOTE — Telephone Encounter (Signed)
rx sent to pharmacy by e-script  

## 2011-02-03 ENCOUNTER — Telehealth: Payer: Self-pay | Admitting: Pulmonary Disease

## 2011-02-03 ENCOUNTER — Other Ambulatory Visit: Payer: Self-pay | Admitting: *Deleted

## 2011-02-03 MED ORDER — ALPRAZOLAM 0.25 MG PO TABS: 0.2500 mg | ORAL_TABLET | Freq: Every evening | ORAL | Status: DC | PRN

## 2011-02-03 NOTE — Telephone Encounter (Signed)
rx faxed to pharmacy manually  

## 2011-02-03 NOTE — Telephone Encounter (Signed)
Unchanged scar tissue as before  I spoke with patient about results and he verbalized understanding and had no questions

## 2011-02-03 NOTE — Telephone Encounter (Signed)
Form on your desk  

## 2011-02-03 NOTE — Telephone Encounter (Signed)
Okay #30 x 1 

## 2011-02-18 LAB — CBC
Hemoglobin: 12
MCHC: 33.8
RBC: 3.8 — ABNORMAL LOW
WBC: 8.2

## 2011-02-18 LAB — BASIC METABOLIC PANEL
CO2: 26
Calcium: 9.4
Chloride: 96
Creatinine, Ser: 1.23 — ABNORMAL HIGH
GFR calc Af Amer: 51 — ABNORMAL LOW
Sodium: 131 — ABNORMAL LOW

## 2011-04-26 ENCOUNTER — Encounter: Payer: Self-pay | Admitting: Internal Medicine

## 2011-04-26 ENCOUNTER — Ambulatory Visit (INDEPENDENT_AMBULATORY_CARE_PROVIDER_SITE_OTHER): Payer: Medicare PPO | Admitting: Internal Medicine

## 2011-04-26 VITALS — BP 172/67 | HR 65 | Temp 98.1°F | Ht 60.0 in | Wt 149.0 lb

## 2011-04-26 DIAGNOSIS — F411 Generalized anxiety disorder: Secondary | ICD-10-CM

## 2011-04-26 DIAGNOSIS — E1129 Type 2 diabetes mellitus with other diabetic kidney complication: Secondary | ICD-10-CM

## 2011-04-26 DIAGNOSIS — I10 Essential (primary) hypertension: Secondary | ICD-10-CM

## 2011-04-26 DIAGNOSIS — J8410 Pulmonary fibrosis, unspecified: Secondary | ICD-10-CM

## 2011-04-26 LAB — BASIC METABOLIC PANEL
BUN: 32 mg/dL — ABNORMAL HIGH (ref 6–23)
CO2: 28 mEq/L (ref 19–32)
Glucose, Bld: 110 mg/dL — ABNORMAL HIGH (ref 70–99)
Potassium: 4.4 mEq/L (ref 3.5–5.1)
Sodium: 140 mEq/L (ref 135–145)

## 2011-04-26 MED ORDER — ESTROGENS, CONJUGATED 0.625 MG/GM VA CREA: TOPICAL_CREAM | VAGINAL | Status: DC

## 2011-04-26 NOTE — Patient Instructions (Signed)
Please stop the tylenol PM. It would be okay to try the regular tylenol or melatonin 3-5mg  at bedtime to help sleep

## 2011-04-26 NOTE — Progress Notes (Signed)
Subjective:    Patient ID: Erica Hansen, female    DOB: [birth year], 77 y.o.   MRN: [id number]  HPI Doing okay Dr Vassie Loll has been weaning off the prednisone--she is off totally now Has mild persistent cough which is not bad. Generally dry but occ phlegm Oximetry is good Gets SOB if she is doing housework--better with brief rest  Checks sugars 2-3 times per week Most under 120 No hypoglycemic reactions Just had eye exam by Dr Inez Pilgrim this summer  No chest pain Doe shave occ indigestion though  Continues with anxiety Does okay with xanax just at bedtime  No falls in the past year--did stumble once Does use cane regularly  Not depressed Not anhedonic--recent trip to [facility], [facility], etc  Current Outpatient Prescriptions on File Prior to Visit  Medication Sig Dispense Refill  . ALPRAZolam (XANAX) 0.25 MG tablet Take 1 tablet (0.25 mg total) by mouth at bedtime as needed.  30 tablet  1  . aspirin 81 MG tablet Take 81 mg by mouth daily.        . Calcium Carbonate-Vitamin D (CALCIUM 600+D) 600-200 MG-UNIT TABS Take 1 tablet by mouth 2 (two) times daily.        . Cranberry (YL CRANBERRY CONCENTRATE) 300 MG tablet Take 300 mg by mouth daily.        Marland Kitchen diltiazem (CARDIZEM CD) 120 MG 24 hr capsule Take 1 capsule (120 mg total) by mouth daily.  90 capsule  3  . docusate sodium (COLACE) 100 MG capsule Take 100 mg by mouth 2 (two) times daily as needed.        Marland Kitchen ketoconazole (NIZORAL) 2 % cream Apply 1 application topically 2 (two) times daily as needed.  60 g  3  . metformin (FORTAMET) 500 MG (OSM) 24 hr tablet Take 500 mg by mouth 2 (two) times daily with a meal.        . Multiple Vitamins-Minerals (CENTRUM SILVER PO) Take 1 capsule by mouth daily.        . nadolol (CORGARD) 80 MG tablet Take 80 mg by mouth daily.        Marland Kitchen olmesartan-hydrochlorothiazide (BENICAR HCT) 40-12.5 MG per tablet Take 1 tablet by mouth daily.  90 tablet  3  . Omega-3 Fatty  Acids (FISH OIL) 1200 MG CAPS Take 1 capsule by mouth daily.        Bertram Gala Glycol-Propyl Glycol (SYSTANE) 0.4-0.3 % SOLN Place 2 drops into both eyes at bedtime.       Marland Kitchen terazosin (HYTRIN) 5 MG capsule Take 1 capsule (5 mg total) by mouth every evening.  90 capsule  3    Allergies  Allergen Reactions  . Clindamycin     REACTION: diffuse maculopapular rash  . Lisinopril     REACTION: cough  . Penicillins   . Sulfonamide Derivatives     Past Medical History  Diagnosis Date  . Diverticulosis   . Unspecified essential hypertension   . Osteoarthritis   . Type II or unspecified type diabetes mellitus without mention of complication, not stated as uncontrolled   . Anxiety states   . Lung fibrosis   . Urinary incontinence     Past Surgical History  Procedure Date  . Abdominal hysterectomy   . Replacement total knee   . Laminectomy   . Humerus fracture surgery     Family History  Problem Relation Age of Onset  . Colon cancer Father  History   Social History  . Marital Status: Widowed    Spouse Name: N/A    Number of Children: 3  . Years of Education: N/A   Occupational History  .     Social History Main Topics  . Smoking status: Never Smoker   . Smokeless tobacco: Never Used  . Alcohol Use: Yes     occasional  . Drug Use: Not on file  . Sexually Active: Not on file   Other Topics Concern  . Not on file   Social History Narrative   lives with daughter [name]) Has 2 sons in [place]   Review of Systems Has been using tylenol PM for a long time---discussed that this is not a good idea. Recommended that she switch to plain tylenol or can try melatonin. Uses the one xanax at bedtime as well Appetite is okay Weight is stable    Objective:   Physical Exam  Constitutional: She appears well-developed and well-nourished. No distress.  Neck: Normal range of motion. Neck supple. No thyromegaly present.  Cardiovascular: Normal rate, regular rhythm  and normal heart sounds.  Exam reveals no gallop.   No murmur heard.      Faint distal pulses  Pulmonary/Chest: Effort normal. No respiratory distress. She has no wheezes. She has rales.       Fine bibasilar crackles  Abdominal: Soft. There is no tenderness.  Musculoskeletal: She exhibits edema. She exhibits no tenderness.       1+ edema bilaterally  Lymphadenopathy:    She has no cervical adenopathy.  Psychiatric: She has a normal mood and affect. Her behavior is normal. Judgment and thought content normal.          Assessment & Plan:

## 2011-04-26 NOTE — Assessment & Plan Note (Signed)
Improved Now off prednisone with good sats and stable resp status

## 2011-04-26 NOTE — Assessment & Plan Note (Signed)
Lab Results  Component Value Date   HGBA1C 6.7* 10/25/2010   Has had good control Will recheck  Lab Results  Component Value Date   CREATININE 1.5* 10/25/2010

## 2011-04-26 NOTE — Assessment & Plan Note (Signed)
BP Readings from Last 3 Encounters:  04/26/11 172/67  01/20/11 170/84  10/25/10 148/70   Seems to have some white coat component BP 140 [facility] Will be getting home eval through insurance soon If still up next time, would increase olmesartan

## 2011-04-26 NOTE — Assessment & Plan Note (Signed)
Mild  Discussed stopping the benedryl at night

## 2011-04-27 ENCOUNTER — Telehealth: Payer: Self-pay | Admitting: *Deleted

## 2011-04-27 NOTE — Telephone Encounter (Signed)
Let her know those are very reassuring No changes in meds

## 2011-04-27 NOTE — Telephone Encounter (Signed)
Patient called to give Dr. Alphonsus Sias a few BP readings that were done by the fire department.  She stated that her BP was elevated during her last office visit here.  02/03/2011 148/70 02/20/2011 130/68

## 2011-04-27 NOTE — Telephone Encounter (Signed)
Left message on VM with results, advised pt to call if any questions  

## 2011-05-02 ENCOUNTER — Encounter: Payer: Self-pay | Admitting: *Deleted

## 2011-06-06 ENCOUNTER — Encounter: Payer: Self-pay | Admitting: Adult Health

## 2011-06-06 ENCOUNTER — Ambulatory Visit (INDEPENDENT_AMBULATORY_CARE_PROVIDER_SITE_OTHER): Payer: Medicare PPO | Admitting: Adult Health

## 2011-06-06 DIAGNOSIS — J40 Bronchitis, not specified as acute or chronic: Secondary | ICD-10-CM

## 2011-06-06 MED ORDER — PREDNISONE 10 MG PO TABS: ORAL_TABLET | ORAL | Status: DC

## 2011-06-06 MED ORDER — DOXYCYCLINE HYCLATE 100 MG PO TABS: 100.0000 mg | ORAL_TABLET | Freq: Two times a day (BID) | ORAL | Status: AC

## 2011-06-06 NOTE — Patient Instructions (Signed)
Doxycycline 100mg  Twice daily  For 7 days  Mucinex DM Twice daily  As needed  Cough/congestion  Prednisone taper over next week.  Please contact office for sooner follow up if symptoms do not improve or worsen or seek emergency care  follow up Dr. Vassie Loll  In 3 weeks and As needed

## 2011-06-06 NOTE — Progress Notes (Signed)
  Subjective:    Patient ID: Erica Hansen, female    DOB: [birth year], 78 y.o.   MRN: [id number]  HPI  88/F, never smoker for FU of pulmonary fibrosis, on empiric steroids for cough since 2/10 with ceiling of 20 mg  Baseline On 2.5 mg twice/ wk currently   January 08, 2009  Dropped prednisone to 10 mg in 7/10 & 5 mg in 8/10. Now c/o dry cough x 2 weeks & increasing dyspnea esp on climbing steps. Easy bruising prior to taking steroids. .   01/20/11  Pt returns for follow up . Says she is doing well. Currently on prednisone 2.5mg  twice weekly with no flare in symptoms since last ov.  She did not qualify for the ASCEND Trial due to age limit. Minimal cough . Dyspnea at baseline.  NO discolored mucus. Daughter is with her today.  CXR - stable fibrosis >tapered off steroids  06/06/2011 Acute OV  Complains of prod cough with clear mucus, wheezing, sinus pressure/PND/sneezing x3days.  No increased edema or hemotpysis . No fever.     Review of Systems  Patient denies significant dyspnea,cough, hemoptysis,  chest pain, palpitations, pedal edema, orthopnea, paroxysmal nocturnal dyspnea, lightheadedness, nausea, vomiting, abdominal or  leg pains       Objective:   Physical Exam  Gen. Pleasant, well-nourished, in no distress ENT - no lesions, no post nasal drip Neck: No JVD, no thyromegaly, no carotid bruits Lungs: no use of accessory muscles, no dullness to percussion, BL 1/2 rales, no rhonchi  Cardiovascular: Rhythm regular, heart sounds  normal, no murmurs or gallops, no peripheral edema Musculoskeletal: No deformities, no cyanosis or clubbing         Assessment & Plan:

## 2011-06-07 DIAGNOSIS — J40 Bronchitis, not specified as acute or chronic: Secondary | ICD-10-CM | POA: Insufficient documentation

## 2011-06-07 NOTE — Assessment & Plan Note (Signed)
Acute flare with underlying pulm fibrosis   Plan;  Doxycycline 100mg  Twice daily  For 7 days  Mucinex DM Twice daily  As needed  Cough/congestion  Prednisone taper over next week.  Please contact office for sooner follow up if symptoms do not improve or worsen or seek emergency care  follow up Dr. Vassie Loll  In 3 weeks and As needed

## 2011-06-23 ENCOUNTER — Other Ambulatory Visit: Payer: Self-pay | Admitting: Internal Medicine

## 2011-06-23 NOTE — Telephone Encounter (Signed)
Patient came in the office and we handled all her rx's

## 2011-07-07 ENCOUNTER — Encounter: Payer: Self-pay | Admitting: Pulmonary Disease

## 2011-07-07 ENCOUNTER — Ambulatory Visit (INDEPENDENT_AMBULATORY_CARE_PROVIDER_SITE_OTHER): Payer: Medicare PPO | Admitting: Pulmonary Disease

## 2011-07-07 DIAGNOSIS — J8410 Pulmonary fibrosis, unspecified: Secondary | ICD-10-CM

## 2011-07-07 MED ORDER — ALPRAZOLAM 0.25 MG PO TABS: 0.2500 mg | ORAL_TABLET | Freq: Every evening | ORAL | Status: DC | PRN

## 2011-07-07 MED ORDER — PREDNISONE 10 MG PO TABS: ORAL_TABLET | ORAL | Status: DC

## 2011-07-07 NOTE — Progress Notes (Signed)
  Subjective:    Patient ID: Erica Hansen, female    DOB: 02-Jun-1922, 78 y.o.   MRN: [id number]  HPI  88/F, never smoker for FU of pulmonary fibrosis,  She was treated with empiric steroids for cough since 2/10 >tapered off 9/12 Easy bruising prior to taking steroids.  She did not qualify for the ASCEND Trial due to age limit. CXR 9/12 - stable fibrosis    07/07/2011 06/06/2011 Acute OV  Complains of prod cough with clear mucus, wheezing, sinus pressure/PND/sneezing x3days.  No increased edema or hemotpysis . No fever >> doxy, prednisone c/o cough is not "completely gone", worse "after supper". Does not feel Robitussion helps- minimal clear phlegm Feels uptight , worries a lot about taxes & other things, takes extra BP pill Has to attend a meeting in May   Review of Systems Patient denies significant dyspnea,cough, hemoptysis,  chest pain, palpitations, pedal edema, orthopnea, paroxysmal nocturnal dyspnea, lightheadedness, nausea, vomiting, abdominal or  leg pains      Objective:   Physical Exam  Gen. Pleasant, well-nourished, in no distress ENT - no lesions, no post nasal drip Neck: No JVD, no thyromegaly, no carotid bruits Lungs: no use of accessory muscles, no dullness to percussion, bibasal 1/3 rales, no rhonchi  Cardiovascular: Rhythm regular, heart sounds  normal, no murmurs or gallops, no peripheral edema Musculoskeletal: No deformities, no cyanosis or clubbing        Assessment & Plan:

## 2011-07-07 NOTE — Patient Instructions (Addendum)
Take prednisone 10 mg x 2 weeks, then 5 mg x  2 weeks Stop mucinex  - DELSYM 2 tsp thrice daily  - if no better in 1 week call me OK to take extra dose of xanax daytime

## 2011-07-07 NOTE — Assessment & Plan Note (Signed)
Most likely post bronchitic cough  Better but not fully resolved IPF cough has responded to low dose prednisone in the past - do not want t make sugars worse.  Take prednisone 10 mg x 2 weeks, then 5 mg x  2 weeks - will reassess at that point whether this can be stopped or needs to be continued Stop mucinex  - DELSYM 2 tsp thrice daily  - if no better in 1 week call me OK to take extra dose of xanax daytime

## 2011-07-25 ENCOUNTER — Other Ambulatory Visit: Payer: Self-pay | Admitting: *Deleted

## 2011-07-25 MED ORDER — METFORMIN HCL 500 MG PO TABS: 500.0000 mg | ORAL_TABLET | Freq: Two times a day (BID) | ORAL | Status: DC

## 2011-07-25 MED ORDER — NADOLOL 80 MG PO TABS: 80.0000 mg | ORAL_TABLET | Freq: Every day | ORAL | Status: DC

## 2011-07-25 NOTE — Telephone Encounter (Signed)
Received letter in the mail from pt asking for refills of metformin and nadolol to be sent to [facility]. rx sent to pharmacy by e-script Spoke with patient and advised results

## 2011-08-26 ENCOUNTER — Ambulatory Visit (INDEPENDENT_AMBULATORY_CARE_PROVIDER_SITE_OTHER): Payer: Medicare PPO | Admitting: Internal Medicine

## 2011-08-26 ENCOUNTER — Encounter: Payer: Self-pay | Admitting: Internal Medicine

## 2011-08-26 VITALS — BP 120/60 | HR 70 | Temp 98.1°F | Wt 139.0 lb

## 2011-08-26 DIAGNOSIS — T50904A Poisoning by unspecified drugs, medicaments and biological substances, undetermined, initial encounter: Secondary | ICD-10-CM

## 2011-08-26 DIAGNOSIS — K521 Toxic gastroenteritis and colitis: Secondary | ICD-10-CM

## 2011-08-26 DIAGNOSIS — R197 Diarrhea, unspecified: Secondary | ICD-10-CM

## 2011-08-26 MED ORDER — METFORMIN HCL ER 500 MG PO TB24: 1000.0000 mg | ORAL_TABLET | Freq: Every day | ORAL | Status: DC

## 2011-08-26 NOTE — Assessment & Plan Note (Signed)
Probably has diarrhea due to metformin Will change to ER Decrease dose if diarrhea persists

## 2011-08-26 NOTE — Patient Instructions (Signed)
Please call if your diarrhea isn't better in 2 weeks or so

## 2011-08-26 NOTE — Progress Notes (Signed)
Subjective:    Patient ID: Erica Hansen, female    DOB: 26-Jul-1922, 78 y.o.   MRN: [id number]  HPI Here with daughter Has been noting loose stools after taking meds Happening AM and PM Mostly since Easter--she had eaten more Now appetite is off due to this Has lost some weight Occ abdominal pain---sharp---mostly epigastric. Brief mostly. Improved when she moves bowels Often has the pain when on the cammode May have more than 1 loose stool at a time No blood in stool  Current Outpatient Prescriptions on File Prior to Visit  Medication Sig Dispense Refill  . ALPRAZolam (XANAX) 0.25 MG tablet Take 1 tablet (0.25 mg total) by mouth at bedtime as needed (may take extra daytime dose prn).  60 tablet  0  . aspirin 81 MG tablet Take 81 mg by mouth daily.        . Calcium Carbonate-Vitamin D (CALCIUM 600+D) 600-200 MG-UNIT TABS Take 1 tablet by mouth 2 (two) times daily.       . Cranberry (YL CRANBERRY CONCENTRATE) 300 MG tablet Take 300 mg by mouth daily.        Marland Kitchen diltiazem (CARDIZEM CD) 120 MG 24 hr capsule Take 1 capsule (120 mg total) by mouth daily.  90 capsule  3  . ketoconazole (NIZORAL) 2 % cream Apply 1 application topically 2 (two) times daily as needed.  60 g  3  . Melatonin 5 MG TABS Take 1 tablet by mouth at bedtime as needed.      . metFORMIN (GLUCOPHAGE) 500 MG tablet Take 1 tablet (500 mg total) by mouth 2 (two) times daily with a meal.  180 tablet  3  . Multiple Vitamins-Minerals (CENTRUM SILVER PO) Take 1 capsule by mouth daily.        . nadolol (CORGARD) 80 MG tablet Take 1 tablet (80 mg total) by mouth daily.  90 tablet  3  . olmesartan-hydrochlorothiazide (BENICAR HCT) 40-12.5 MG per tablet Take 1 tablet by mouth daily.  90 tablet  3  . Omega Fatty Acids-Vitamins (OMEGA-3 GUMMIES) CHEW 2 gummies by mouth once daily      . Polyethyl Glycol-Propyl Glycol (SYSTANE) 0.4-0.3 % SOLN Place 2 drops into both eyes at bedtime.       Marland Kitchen terazosin (HYTRIN) 5 MG capsule Take 1  capsule (5 mg total) by mouth every evening.  90 capsule  3    Allergies  Allergen Reactions  . Clindamycin     REACTION: diffuse maculopapular rash  . Lisinopril     REACTION: cough  . Penicillins   . Sulfonamide Derivatives     Past Medical History  Diagnosis Date  . Diverticulosis   . Unspecified essential hypertension   . Osteoarthritis   . Type II or unspecified type diabetes mellitus without mention of complication, not stated as uncontrolled   . Anxiety states   . Lung fibrosis   . Urinary incontinence     Past Surgical History  Procedure Date  . Abdominal hysterectomy   . Replacement total knee   . Laminectomy   . Humerus fracture surgery     Family History  Problem Relation Age of Onset  . Colon cancer Father     History   Social History  . Marital Status: Widowed    Spouse Name: N/A    Number of Children: 3  . Years of Education: N/A   Occupational History  .     Social History Main Topics  . Smoking status: Never  Smoker   . Smokeless tobacco: Never Used  . Alcohol Use: Yes     occasional  . Drug Use: Not on file  . Sexually Active: Not on file   Other Topics Concern  . Not on file   Social History Narrative   lives with daughter [name]) Has 2 sons in [place]   Review of Systems No fever No nausea or vomiting Occ headache---not clearly related to this Has had some sinus congestion    Objective:   Physical Exam  Constitutional: She appears well-developed and well-nourished. No distress.  Abdominal: Soft. Bowel sounds are normal. She exhibits no distension and no mass. There is no tenderness. There is no rebound and no guarding.          Assessment & Plan:

## 2011-09-02 ENCOUNTER — Encounter: Payer: Self-pay | Admitting: Pulmonary Disease

## 2011-09-02 ENCOUNTER — Ambulatory Visit (INDEPENDENT_AMBULATORY_CARE_PROVIDER_SITE_OTHER): Payer: Medicare PPO | Admitting: Pulmonary Disease

## 2011-09-02 VITALS — BP 120/60 | HR 59 | Temp 97.7°F | Ht 61.0 in | Wt 140.6 lb

## 2011-09-02 DIAGNOSIS — R05 Cough: Secondary | ICD-10-CM

## 2011-09-02 DIAGNOSIS — J8410 Pulmonary fibrosis, unspecified: Secondary | ICD-10-CM

## 2011-09-02 MED ORDER — BENZONATATE 200 MG PO CAPS: 200.0000 mg | ORAL_CAPSULE | Freq: Two times a day (BID) | ORAL | Status: AC | PRN

## 2011-09-02 NOTE — Assessment & Plan Note (Signed)
PFTs 7/09 >>FVC 59% - mod restriction with desaturation on exertion.  ANA neg, RA factor neg - esr 71  Pneumovax 2003  11/09 CT chest >>Pulmonary parenchymal pattern of fibrosis is most consistent with usual interstitial pneumonia (UIP). Increase in superimposed ground-glass suggests areas of active inflammation.  Appears stable

## 2011-09-02 NOTE — Patient Instructions (Signed)
Treatment for cough -  DELSYM  2  Stp thrice daily as needed Tessalon perles 200 thrice daily Call me if cough persists inspite of above

## 2011-09-02 NOTE — Assessment & Plan Note (Signed)
We discussed treatment strategy for cough - delsym, tessalon If these dont work, then prednisone low dose

## 2011-09-02 NOTE — Progress Notes (Signed)
  Subjective:    Patient ID: Erica Hansen, female    DOB: 05/01/1923, 78 y.o.   MRN: [id number]  HPI   88/F, never smoker for FU of pulmonary fibrosis,  She was treated with empiric steroids for cough since 2/10 with good response >tapered off 9/12 Easy bruising prior to taking steroids.  She did not qualify for the ASCEND Trial due to age limit. CXR 9/12 - stable fibrosis   PFTs 7/09 >>FVC 59% - mod restriction with desaturation on exertion.  ANA neg, RA factor neg - esr 71  Pneumovax 2003  11/09 CT chest >>Pulmonary parenchymal pattern of fibrosis is most consistent with usual interstitial pneumonia (UIP). Increase in superimposed ground-glass suggests areas of active inflammation.  09/02/2011 06/06/2011 Acute OV for  prod cough with clear mucus, wheezing, sinus pressure/PND/sneezing >> doxy, prednisone started & tapered off by 3/13  DELSYM works Takes bedtime dose of xanax daily Had a bout of diarrhea - metformin formulation changed     Review of Systems  Patient denies significant dyspnea,cough, hemoptysis,  chest pain, palpitations, pedal edema, orthopnea, paroxysmal nocturnal dyspnea, lightheadedness, nausea, vomiting, abdominal or  leg pains      Objective:   Physical Exam   Gen. Pleasant, well-nourished, in no distress ENT - no lesions, no post nasal drip Neck: No JVD, no thyromegaly, no carotid bruits Lungs: no use of accessory muscles, no dullness to percussion, bibasal 1/3 rales, no rhonchi  Cardiovascular: Rhythm regular, heart sounds  normal, no murmurs or gallops, no peripheral edema Musculoskeletal: No deformities, no cyanosis or clubbing        Assessment & Plan:

## 2011-09-06 ENCOUNTER — Telehealth: Payer: Self-pay | Admitting: *Deleted

## 2011-09-06 ENCOUNTER — Telehealth: Payer: Self-pay | Admitting: Internal Medicine

## 2011-09-06 NOTE — Telephone Encounter (Signed)
I already spoke to son in-law about this, does she still need the appt?

## 2011-09-06 NOTE — Telephone Encounter (Signed)
Spoke with dennis son in-law and advised results, they will call if any problems

## 2011-09-06 NOTE — Telephone Encounter (Signed)
Have her stop the metformin completely Her diabetes control is good enough that she should be able to do without it We will recheck labs next time  Have her call if the diarrhea persists (I will consider cholestyramine to slow it down) or if her fasting sugars are regularly over 150 or so off the metformin

## 2011-09-06 NOTE — Telephone Encounter (Signed)
Caller: Kathy/Other; PCP: Tillman Abide I.; CB#: [phone]; ; ; Call regarding Diarrhea. Onset 2. Weeks Ago. Saw Dr. Alphonsus Sias 08/26/11 for This and He Changed Her Metformin To the XL, Did Not Help. Tried Immodium and Align, Has Only Helped A. Litle Bit. Afebrile. Is Still Drinking and Urinating.; Is having approx 3-4 stools a day. All emergent sxs per Diarrhea protocol r/o. Home care advice given. Advised OV within 24 hours. Appt made for 09/07/11 @ 0900AM with Dr. Clifton Custard.

## 2011-09-06 NOTE — Telephone Encounter (Signed)
Daughter calling stating that pt's diarrhea is not better and changing the metformin didn't help, please advise

## 2011-09-07 ENCOUNTER — Encounter: Payer: Self-pay | Admitting: Family Medicine

## 2011-09-07 ENCOUNTER — Ambulatory Visit (INDEPENDENT_AMBULATORY_CARE_PROVIDER_SITE_OTHER): Payer: Medicare PPO | Admitting: Family Medicine

## 2011-09-07 VITALS — BP 124/50 | HR 72 | Temp 97.6°F | Wt 140.0 lb

## 2011-09-07 DIAGNOSIS — R197 Diarrhea, unspecified: Secondary | ICD-10-CM

## 2011-09-07 NOTE — Patient Instructions (Signed)
Nice to meet you. I would give this a few day off of the Metformin. If you blood sugar goes up, call Dr. Alphonsus Sias. We can a check a stool sample if your symptoms persist.

## 2011-09-07 NOTE — Telephone Encounter (Signed)
Patient seen today by Dr. Aron. 

## 2011-09-07 NOTE — Telephone Encounter (Signed)
If no abdominal pain or blood, and able to eat--I would see how she does off the metformin first

## 2011-09-07 NOTE — Progress Notes (Signed)
Subjective:    Patient ID: Erica Hansen, female    DOB: 10-22-1922, 78 y.o.   MRN: [id number]  HPI 78 yo pt of Dr. Alphonsus Sias here with her daughter for persistent diarrhea (x 3 weeks)  Saw PCP for this earlier this month. Note reviewed.  Felt it was due to Metformin.  Initially changed to ER form, then decrease dose of Metformin.  Diarrhea persisted, called office and PCP advised stopping Metformin yesterday. Pt is here because she does not feel comfortable with this plan. Ate breakfast this morning, no diarrhea. No abdominal pain. No  Blood in stool.   CBGs ranging 90-115.  Lab Results  Component Value Date   HGBA1C 6.5 04/26/2011   No recent abx use or hospitalizations.    Current Outpatient Prescriptions on File Prior to Visit  Medication Sig Dispense Refill  . ALPRAZolam (XANAX) 0.25 MG tablet Take 1 tablet (0.25 mg total) by mouth at bedtime as needed (may take extra daytime dose prn).  60 tablet  0  . aspirin 81 MG tablet Take 81 mg by mouth daily.        . benzonatate (TESSALON) 200 MG capsule Take 1 capsule (200 mg total) by mouth 2 (two) times daily as needed for cough.  60 capsule  1  . Cranberry (YL CRANBERRY CONCENTRATE) 300 MG tablet Take 300 mg by mouth daily as needed. For urinary symptoms      . diltiazem (CARDIZEM CD) 120 MG 24 hr capsule Take 1 capsule (120 mg total) by mouth daily.  90 capsule  3  . ketoconazole (NIZORAL) 2 % cream Apply 1 application topically 2 (two) times daily as needed.  60 g  3  . Melatonin 5 MG TABS Take 1 tablet by mouth at bedtime as needed.      . metFORMIN (GLUCOPHAGE XR) 500 MG 24 hr tablet Take 2 tablets (1,000 mg total) by mouth daily with breakfast.  60 tablet  11  . Multiple Vitamins-Minerals (CENTRUM SILVER PO) Take 1 capsule by mouth daily.        . nadolol (CORGARD) 80 MG tablet Take 1 tablet (80 mg total) by mouth daily.  90 tablet  3  . olmesartan-hydrochlorothiazide (BENICAR HCT) 40-12.5 MG per tablet Take 1 tablet by  mouth daily.  90 tablet  3  . Polyethyl Glycol-Propyl Glycol (SYSTANE) 0.4-0.3 % SOLN Place 2 drops into both eyes at bedtime.       Marland Kitchen terazosin (HYTRIN) 5 MG capsule Take 1 capsule (5 mg total) by mouth every evening.  90 capsule  3    Allergies  Allergen Reactions  . Clindamycin     REACTION: diffuse maculopapular rash  . Lisinopril     REACTION: cough  . Penicillins   . Sulfonamide Derivatives     Past Medical History  Diagnosis Date  . Diverticulosis   . Unspecified essential hypertension   . Osteoarthritis   . Type II or unspecified type diabetes mellitus without mention of complication, not stated as uncontrolled   . Anxiety states   . Lung fibrosis   . Urinary incontinence     Past Surgical History  Procedure Date  . Abdominal hysterectomy   . Replacement total knee   . Laminectomy   . Humerus fracture surgery     Family History  Problem Relation Age of Onset  . Colon cancer Father     History   Social History  . Marital Status: Widowed    Spouse Name: N/A  Number of Children: 3  . Years of Education: N/A   Occupational History  .     Social History Main Topics  . Smoking status: Never Smoker   . Smokeless tobacco: Never Used  . Alcohol Use: Yes     occasional  . Drug Use: Not on file  . Sexually Active: Not on file   Other Topics Concern  . Not on file   Social History Narrative   lives with daughter [name]) Has 2 sons in [place]   Review of Systems No fever No nausea or vomiting     Objective:   Physical Exam  BP 124/50  Pulse 72  Temp(Src) 97.6 F (36.4 C) (Oral)  Wt 140 lb (63.504 kg)  Constitutional: She appears well-developed and well-nourished. No distress.  Abdominal: Soft. Bowel sounds are normal. She exhibits no distension and no mass. There is no tenderness. There is no rebound and no guarding.      Assessment & Plan:   1. Diarrhea  Stool culture, Clostridium difficile culture-fecal   Improved.   Reassurance provided.  Will order stool cultures if symptoms persist. Advised not taking immodium or other medication at this time and to touch base with Dr. Alphonsus Sias if symptoms persist. See pt instructions for details.

## 2011-09-23 ENCOUNTER — Other Ambulatory Visit: Payer: Self-pay | Admitting: Internal Medicine

## 2011-09-23 DIAGNOSIS — R197 Diarrhea, unspecified: Secondary | ICD-10-CM

## 2011-09-23 NOTE — Progress Notes (Signed)
Addended by: Alvina Chou on: 09/23/2011 02:43 PM   Modules accepted: Orders

## 2011-09-26 ENCOUNTER — Telehealth: Payer: Self-pay | Admitting: Internal Medicine

## 2011-09-26 NOTE — Telephone Encounter (Signed)
.  left message to have patient return my call.  

## 2011-09-26 NOTE — Telephone Encounter (Signed)
Note from patient Concerns about her stools still despite off the metformin  Please call her  The stool culture was negative but the C. Dif test is still pending  Okay to refill alprazolam 0.25mg  #60 x 0  [facility]

## 2011-09-27 LAB — STOOL CULTURE

## 2011-09-27 MED ORDER — ALPRAZOLAM 0.25 MG PO TABS: 0.2500 mg | ORAL_TABLET | Freq: Every evening | ORAL | Status: DC | PRN

## 2011-09-27 NOTE — Telephone Encounter (Signed)
Patient had spoke with Dr. Dayton Martes about lab results, rx called into pharmacy

## 2011-09-28 LAB — CLOSTRIDIUM DIFFICILE EIA: CDIFTX: NEGATIVE

## 2011-10-20 ENCOUNTER — Encounter: Payer: Self-pay | Admitting: Internal Medicine

## 2011-10-20 ENCOUNTER — Ambulatory Visit (INDEPENDENT_AMBULATORY_CARE_PROVIDER_SITE_OTHER): Payer: Medicare PPO | Admitting: Internal Medicine

## 2011-10-20 VITALS — BP 140/60 | HR 65 | Temp 98.0°F | Ht 60.0 in | Wt 137.0 lb

## 2011-10-20 DIAGNOSIS — N058 Unspecified nephritic syndrome with other morphologic changes: Secondary | ICD-10-CM

## 2011-10-20 DIAGNOSIS — Z0000 Encounter for general adult medical examination without abnormal findings: Secondary | ICD-10-CM

## 2011-10-20 DIAGNOSIS — J8410 Pulmonary fibrosis, unspecified: Secondary | ICD-10-CM

## 2011-10-20 DIAGNOSIS — Z79899 Other long term (current) drug therapy: Secondary | ICD-10-CM

## 2011-10-20 DIAGNOSIS — R197 Diarrhea, unspecified: Secondary | ICD-10-CM

## 2011-10-20 DIAGNOSIS — E1129 Type 2 diabetes mellitus with other diabetic kidney complication: Secondary | ICD-10-CM

## 2011-10-20 DIAGNOSIS — I10 Essential (primary) hypertension: Secondary | ICD-10-CM

## 2011-10-20 MED ORDER — NADOLOL 80 MG PO TABS: 80.0000 mg | ORAL_TABLET | Freq: Every day | ORAL | Status: DC

## 2011-10-20 NOTE — Assessment & Plan Note (Signed)
Doing well UTD on imms Cancer screening not appropriate Not depressed

## 2011-10-20 NOTE — Assessment & Plan Note (Signed)
Sugars are fine without meds Will check labs

## 2011-10-20 NOTE — Assessment & Plan Note (Signed)
Seems better with probiotic and off metformin

## 2011-10-20 NOTE — Assessment & Plan Note (Signed)
Cough and dyspnea are stable Sees Dr Vassie Loll

## 2011-10-20 NOTE — Progress Notes (Signed)
Subjective:    Patient ID: Erica Hansen, female    DOB: November 04, 1922, 78 y.o.   MRN: [id number]  HPI Here with daughter for physical Reviewed advanced directives Did have one fall out of bed this year--no sig injury Reviewed advanced directives No depression or anhedonia  UTD on immunizations  Breathing has been okay Only occ cough Follows with Dr Vassie Loll  Checks sugars Generally 105 to 130  Diarrhea is some better Tends to be all in AM Now trying align probiotic---seems to be helping  Current Outpatient Prescriptions on File Prior to Visit  Medication Sig Dispense Refill  . ALPRAZolam (XANAX) 0.25 MG tablet Take 1 tablet (0.25 mg total) by mouth at bedtime as needed (may take extra daytime dose prn).  60 tablet  0  . aspirin 81 MG tablet Take 81 mg by mouth daily.        Marland Kitchen diltiazem (CARDIZEM CD) 120 MG 24 hr capsule Take 1 capsule (120 mg total) by mouth daily.  90 capsule  3  . ketoconazole (NIZORAL) 2 % cream Apply 1 application topically 2 (two) times daily as needed.  60 g  3  . Melatonin 5 MG TABS Take 1 tablet by mouth at bedtime as needed.      . Multiple Vitamins-Minerals (CENTRUM SILVER PO) Take 1 capsule by mouth daily.        . nadolol (CORGARD) 80 MG tablet Take 1 tablet (80 mg total) by mouth daily.  90 tablet  3  . olmesartan-hydrochlorothiazide (BENICAR HCT) 40-12.5 MG per tablet Take 1 tablet by mouth daily.  90 tablet  3  . Polyethyl Glycol-Propyl Glycol (SYSTANE) 0.4-0.3 % SOLN Place 2 drops into both eyes at bedtime.       Marland Kitchen terazosin (HYTRIN) 5 MG capsule Take 1 capsule (5 mg total) by mouth every evening.  90 capsule  3    Allergies  Allergen Reactions  . Clindamycin     REACTION: diffuse maculopapular rash  . Lisinopril     REACTION: cough  . Penicillins   . Sulfonamide Derivatives     Past Medical History  Diagnosis Date  . Diverticulosis   . Unspecified essential hypertension   . Osteoarthritis   . Type II or unspecified type diabetes  mellitus without mention of complication, not stated as uncontrolled   . Anxiety states   . Lung fibrosis   . Urinary incontinence     Past Surgical History  Procedure Date  . Abdominal hysterectomy   . Replacement total knee   . Laminectomy   . Humerus fracture surgery     Family History  Problem Relation Age of Onset  . Colon cancer Father     History   Social History  . Marital Status: Widowed    Spouse Name: N/A    Number of Children: 3  . Years of Education: N/A   Occupational History  .     Social History Main Topics  . Smoking status: Never Smoker   . Smokeless tobacco: Never Used  . Alcohol Use: Yes     occasional  . Drug Use: Not on file  . Sexually Active: Not on file   Other Topics Concern  . Not on file   Social History Narrative   lives with daughter [name]) Has 2 sons in [place] living willDaughter is health care POADiscussed DNR--will accept attempts but no prolonged artificial ventilationNo feeding tube if cognitively unaware   Review of Systems  Constitutional: Positive for unexpected  weight change.       Wears seat belt Worried about a few pounds of weight loss  HENT: Positive for hearing loss. Negative for dental problem and tinnitus.        Regular with dentist---had filling done recently Using water pik  Eyes: Negative for visual disturbance.       Regular with eye doctor 8/13 follow up planned May need cataract surgery---Dr Brasington  Respiratory: Positive for cough and shortness of breath. Negative for chest tightness.   Cardiovascular: Positive for leg swelling. Negative for chest pain and palpitations.  Gastrointestinal: Negative for nausea, vomiting and blood in stool.       No heartburn  Genitourinary: Negative for dysuria and difficulty urinating.       Occ mild incontinence Nocturia x 2-3  Musculoskeletal: Positive for arthralgias. Negative for back pain and joint swelling.       Occ leg and back pain---arthritis  tylenol helps  Skin: Negative for pallor and rash.  Neurological: Positive for headaches. Negative for dizziness, syncope and light-headedness.       Occ headaches---tylenol does help  Hematological: Negative for adenopathy. Bruises/bleeds easily.  Psychiatric/Behavioral: Positive for sleep disturbance. Negative for decreased concentration. The patient is not nervous/anxious.        Occ nighttime awakening and trouble reinitiating       Objective:   Physical Exam  Constitutional: She is oriented to person, place, and time. She appears well-developed and well-nourished. No distress.  HENT:  Head: Normocephalic and atraumatic.  Mouth/Throat: Oropharynx is clear and moist. No oropharyngeal exudate.  Eyes: Conjunctivae and EOM are normal. Pupils are equal, round, and reactive to light.  Neck: Normal range of motion. Neck supple. No thyromegaly present.  Cardiovascular: Normal rate and regular rhythm.  Exam reveals no gallop.   Murmur heard.      Gr2/6 systolic murmur at apex and base Not into carotids  Pulmonary/Chest: Effort normal. No respiratory distress. She has no wheezes. She has rales.       Bibasilar crackles still  Abdominal: Soft. There is no tenderness.  Musculoskeletal: She exhibits edema. She exhibits no tenderness.       1+ non pitting edema in feet  Lymphadenopathy:    She has no cervical adenopathy.  Neurological: She is alert and oriented to person, place, and time.  Skin: No rash noted. No erythema.  Psychiatric: She has a normal mood and affect. Her behavior is normal. Thought content normal.          Assessment & Plan:

## 2011-10-20 NOTE — Assessment & Plan Note (Signed)
BP Readings from Last 3 Encounters:  10/20/11 140/60  09/07/11 124/50  09/02/11 120/60   Okay with current regimen No changes Due for labs

## 2011-10-21 LAB — BASIC METABOLIC PANEL
CO2: 28 mEq/L (ref 19–32)
Calcium: 9.8 mg/dL (ref 8.4–10.5)
Chloride: 107 mEq/L (ref 96–112)
Glucose, Bld: 101 mg/dL — ABNORMAL HIGH (ref 70–99)
Potassium: 4.7 mEq/L (ref 3.5–5.1)
Sodium: 141 mEq/L (ref 135–145)

## 2011-10-21 LAB — CBC WITH DIFFERENTIAL/PLATELET
Basophils Relative: 0.1 % (ref 0.0–3.0)
Eosinophils Absolute: 0.2 10*3/uL (ref 0.0–0.7)
HCT: 36.3 % (ref 36.0–46.0)
Hemoglobin: 11.7 g/dL — ABNORMAL LOW (ref 12.0–15.0)
Lymphocytes Relative: 20.4 % (ref 12.0–46.0)
Lymphs Abs: 1.7 10*3/uL (ref 0.7–4.0)
MCHC: 32.2 g/dL (ref 30.0–36.0)
MCV: 93.1 fl (ref 78.0–100.0)
Neutro Abs: 6.1 10*3/uL (ref 1.4–7.7)
RBC: 3.9 Mil/uL (ref 3.87–5.11)

## 2011-10-21 LAB — LIPID PANEL
Cholesterol: 167 mg/dL (ref 0–200)
HDL: 41.4 mg/dL (ref >39.00)
Triglycerides: 124 mg/dL (ref 0.0–149.0)

## 2011-10-21 LAB — HEMOGLOBIN A1C: Hgb A1c MFr Bld: 6.5 % (ref 4.6–6.5)

## 2011-10-21 LAB — MICROALBUMIN / CREATININE URINE RATIO: Microalb Creat Ratio: 1.4 mg/g (ref 0.0–30.0)

## 2011-10-21 LAB — HEPATIC FUNCTION PANEL
AST: 23 U/L (ref 0–37)
Bilirubin, Direct: 0.1 mg/dL (ref 0.0–0.3)

## 2011-10-24 ENCOUNTER — Encounter: Payer: Self-pay | Admitting: *Deleted

## 2011-11-07 ENCOUNTER — Other Ambulatory Visit: Payer: Self-pay | Admitting: *Deleted

## 2011-11-07 MED ORDER — ALPRAZOLAM 0.25 MG PO TABS: 0.2500 mg | ORAL_TABLET | Freq: Every evening | ORAL | Status: DC | PRN

## 2011-11-07 NOTE — Telephone Encounter (Signed)
Medication phoned to pharmacy.  

## 2011-11-07 NOTE — Telephone Encounter (Signed)
Please call in. I presume she wants it sent locally.  Verify with patient.

## 2011-12-15 ENCOUNTER — Other Ambulatory Visit: Payer: Self-pay

## 2011-12-15 MED ORDER — OLMESARTAN MEDOXOMIL-HCTZ 40-12.5 MG PO TABS: 1.0000 | ORAL_TABLET | Freq: Every day | ORAL | Status: DC

## 2011-12-15 MED ORDER — DILTIAZEM HCL ER COATED BEADS 120 MG PO CP24: 120.0000 mg | ORAL_CAPSULE | Freq: Every day | ORAL | Status: DC

## 2011-12-15 MED ORDER — TERAZOSIN HCL 5 MG PO CAPS: 5.0000 mg | ORAL_CAPSULE | Freq: Every evening | ORAL | Status: DC

## 2011-12-15 NOTE — Telephone Encounter (Signed)
Pt request 90 day refill on diltiazem,benicar HCT and hytrin with #90 x 3 refills on each to [facility]. Pt notified done while on phone.

## 2012-01-04 ENCOUNTER — Other Ambulatory Visit: Payer: Self-pay | Admitting: *Deleted

## 2012-01-04 NOTE — Telephone Encounter (Signed)
Last filled 11/07/2011

## 2012-01-05 ENCOUNTER — Other Ambulatory Visit: Payer: Self-pay

## 2012-01-05 MED ORDER — ALPRAZOLAM 0.25 MG PO TABS: 0.2500 mg | ORAL_TABLET | Freq: Every evening | ORAL | Status: DC | PRN

## 2012-01-05 NOTE — Telephone Encounter (Signed)
Okay #60 x 0 

## 2012-01-05 NOTE — Telephone Encounter (Signed)
Pt concerned alprazolam not at [facility]. I spoke with Aundra Millet at [facility] and did receive refill and will be ready in 30 mins. Pt notified while on phone.

## 2012-01-05 NOTE — Telephone Encounter (Signed)
rx called into pharmacy

## 2012-01-17 ENCOUNTER — Other Ambulatory Visit: Payer: Self-pay | Admitting: *Deleted

## 2012-01-17 MED ORDER — GLUCOSE BLOOD VI STRP: ORAL_STRIP | Status: DC

## 2012-01-20 ENCOUNTER — Encounter: Payer: Self-pay | Admitting: Pulmonary Disease

## 2012-01-20 ENCOUNTER — Telehealth: Payer: Self-pay

## 2012-01-20 ENCOUNTER — Ambulatory Visit (INDEPENDENT_AMBULATORY_CARE_PROVIDER_SITE_OTHER): Payer: Medicare PPO | Admitting: Pulmonary Disease

## 2012-01-20 ENCOUNTER — Ambulatory Visit (INDEPENDENT_AMBULATORY_CARE_PROVIDER_SITE_OTHER)
Admission: RE | Admit: 2012-01-20 | Discharge: 2012-01-20 | Disposition: A | Discharge status: Home / Self Care | Payer: Medicare PPO | Source: Ambulatory Visit | Attending: Pulmonary Disease | Admitting: Pulmonary Disease

## 2012-01-20 VITALS — BP 144/56 | HR 63 | Temp 97.8°F | Ht 60.0 in | Wt 131.0 lb

## 2012-01-20 DIAGNOSIS — R05 Cough: Secondary | ICD-10-CM

## 2012-01-20 DIAGNOSIS — Z23 Encounter for immunization: Secondary | ICD-10-CM

## 2012-01-20 DIAGNOSIS — J8410 Pulmonary fibrosis, unspecified: Secondary | ICD-10-CM

## 2012-01-20 NOTE — Patient Instructions (Addendum)
Flu shot Chest xray today Take cough perles as needed for cough

## 2012-01-20 NOTE — Progress Notes (Signed)
  Subjective:    Patient ID: Erica Hansen, female    DOB: 26-Apr-1923, 78 y.o.   MRN: [id number]  HPI  89/F, never smoker for FU of pulmonary fibrosis,  She was treated with empiric steroids for cough since 2/10 with good response >tapered off 9/12  Easy bruising prior to taking steroids.  She did not qualify for the ASCEND Trial due to age limit. CXR 9/12 - stable fibrosis  PFTs 7/09 >>FVC 59% - mod restriction with desaturation on exertion.  ANA neg, RA factor neg - esr 71  Pneumovax 2003  11/09 CT chest >>Pulmonary parenchymal pattern of fibrosis is most consistent with usual interstitial pneumonia (UIP). Increase in superimposed ground-glass suggests areas of active inflammation.   09/02/2011  06/06/2011  >> doxy, prednisone started & tapered off by 3/13  Takes bedtime dose of xanax daily  >> delsym + tessalon for cough  01/20/2012 Lost 9 lbs from 140 -Had  diarrhea - metformin finally dc'd Dyspnea unchanged , satn appears lower 90% Feels cold - TSH nml 6/13 Cough better - has not needed tessalon   Review of Systems Pt denies any significant  nasal congestion or excess secretions, fever, chills, sweats, unintended wt loss, pleuritic or exertional cp, orthopnea pnd or leg swelling.  Pt also denies any obvious fluctuation in symptoms with weather or environmental change or other alleviating or aggravating factors.    Pt denies any increase in rescue therapy over baseline, denies waking up needing it or having early am exacerbations or coughing/wheezing/ or dyspnea  2    Objective:   Physical Exam  Gen. Pleasant, well-nourished, in no distress ENT - no lesions, no post nasal drip Neck: No JVD, no thyromegaly, no carotid bruits Lungs: no use of accessory muscles, no dullness to percussion, bibasal rales, nor rhonchi  Cardiovascular: Rhythm regular, heart sounds  normal, no murmurs or gallops, no peripheral edema Musculoskeletal: No deformities, no cyanosis or clubbing          Assessment & Plan:

## 2012-01-20 NOTE — Assessment & Plan Note (Signed)
Flu shot Chest xray today Saturation appears slight worse - may need O2 in future - try to delay this as much as possible Take cough perles as needed for cough Wt loss is concerning - may be due to recent diarrhea & diet

## 2012-01-20 NOTE — Telephone Encounter (Signed)
Pt said mail order phar cost $53.25 for # 90 Nadolol and [facility] [address] [facility] $24.00 for # 90. Spoke with Clarisse Gouge at [facility] pt had already called them and refills were [facility]. Clarisse Gouge will call pt when med ready for pick up.pt understood.

## 2012-02-13 ENCOUNTER — Ambulatory Visit (INDEPENDENT_AMBULATORY_CARE_PROVIDER_SITE_OTHER): Payer: Medicare PPO | Admitting: Internal Medicine

## 2012-02-13 ENCOUNTER — Ambulatory Visit: Payer: Medicare PPO | Admitting: Internal Medicine

## 2012-02-13 ENCOUNTER — Encounter: Payer: Self-pay | Admitting: Internal Medicine

## 2012-02-13 ENCOUNTER — Telehealth: Payer: Self-pay | Admitting: Internal Medicine

## 2012-02-13 VITALS — BP 126/60 | HR 61 | Temp 98.6°F | Wt 129.0 lb

## 2012-02-13 DIAGNOSIS — R200 Anesthesia of skin: Secondary | ICD-10-CM | POA: Insufficient documentation

## 2012-02-13 DIAGNOSIS — R209 Unspecified disturbances of skin sensation: Secondary | ICD-10-CM

## 2012-02-13 NOTE — Telephone Encounter (Signed)
Please check with her Offer appt today if she is still having symptoms

## 2012-02-13 NOTE — Telephone Encounter (Signed)
[facility] Triage Call Report Triage Record Num: [id number] Operator: Trey Paula Patient Name: Erica Hansen Call Date & Time: 02/11/2012 8:12:04PM Patient Phone: [phone] PCP: Tillman Abide Patient Gender: Female PCP Fax : [phone] Patient DOB: 1922-11-11 Practice Name: [facility] Reason for Call: Caller: Susan/Other; PCP: Tillman Abide I.; CB#: [phone]; Call regarding Sudden onset of numbness in left leg from knee to toe, walking fine, no associated weakness. Symptoms onset 45 minutes ago. Blood Sugar was 134 yesterday. History of neuropathy in feet, seems worse than normal. Was at the store and has tight stockings on her legs. No other symptoms, no other neurological deficits. All emergent symptoms ruled out per "Neurological Deficits" protocol with the exception of "Localized numbness/tingling, weakness, prolonged morning stiffness or pain that persists despite treatment of self care." Advised to See Provider within 72 hours. To remove stockings, rest and elevate feet, granddaughter who is a physician is there with patient and will stay with her, if symptoms do not resolve or if symptoms worsen to seek evaluation immediately. Verbalizes understanding. Protocol(s) Used: Neurological Deficits Recommended Outcome per Protocol: See Provider within 72 Hours Reason for Outcome: Localized numbness/tingling, weakness, prolonged morning stiffness or pain (not related to recent trauma) that persists despite treatment or self care Care Advice: ~ Avoid any activity that produces symptoms until evaluated by provider. ~ If numbness/tingling, weakness or pain worsens, see provider in 24 hours. ~ Call provider immediately if symptoms interfere with activities of daily living. ~ SYMPTOM / CONDITION MANAGEMENT 02/11/2012 8:31:56PM Page 1 of 1 [id number]

## 2012-02-13 NOTE — Telephone Encounter (Signed)
Patient on schedule today at 4:00

## 2012-02-13 NOTE — Progress Notes (Signed)
Subjective:    Patient ID: Erica Hansen, female    DOB: 11-06-1922, 78 y.o.   MRN: [id number]  HPI Here with daughter Olegario Messier  Has sudden spell of numbness in left foot 2 days ago around 7PM Had just gotten in car after shopping at grocery store Foot and up to mid or upper calf---back and lateral side Lasted until a while after elevating it--- perhaps 3 hours (able to drive home and walk on it) Recurred in foot yesterday ~supper time. Lasted only a little while No weakness No speech or swallowing problems No facial droop No headaches  Has been checking 107 to 135. One at 153 apparently  Current Outpatient Prescriptions on File Prior to Visit  Medication Sig Dispense Refill  . ALPRAZolam (XANAX) 0.25 MG tablet Take 0.25 mg by mouth at bedtime. And as needed during the day      . aspirin 81 MG tablet Take 81 mg by mouth daily.        Marland Kitchen diltiazem (CARDIZEM CD) 120 MG 24 hr capsule Take 1 capsule (120 mg total) by mouth daily.  90 capsule  3  . glucose blood test strip Test once daily or as directed dx 250.40  50 each  12  . ketoconazole (NIZORAL) 2 % cream Apply 1 application topically 2 (two) times daily as needed.  60 g  3  . Melatonin 5 MG TABS Take 1 tablet by mouth at bedtime.       . Multiple Vitamins-Minerals (CENTRUM SILVER PO) Take 1 capsule by mouth daily.        . nadolol (CORGARD) 80 MG tablet Take 1 tablet (80 mg total) by mouth daily.  90 tablet  3  . olmesartan-hydrochlorothiazide (BENICAR HCT) 40-12.5 MG per tablet Take 1 tablet by mouth daily.  90 tablet  3  . Polyethyl Glycol-Propyl Glycol (SYSTANE) 0.4-0.3 % SOLN Place 2 drops into both eyes at bedtime.       Marland Kitchen terazosin (HYTRIN) 5 MG capsule Take 1 capsule (5 mg total) by mouth every evening.  90 capsule  3    Allergies  Allergen Reactions  . Clindamycin     REACTION: diffuse maculopapular rash  . Lisinopril     REACTION: cough  . Penicillins   . Sulfonamide Derivatives     Past Medical History    Diagnosis Date  . Diverticulosis   . Unspecified essential hypertension   . Osteoarthritis   . Type II or unspecified type diabetes mellitus without mention of complication, not stated as uncontrolled   . Anxiety states   . Lung fibrosis   . Urinary incontinence     Past Surgical History  Procedure Date  . Abdominal hysterectomy   . Replacement total knee   . Laminectomy   . Humerus fracture surgery     Family History  Problem Relation Age of Onset  . Colon cancer Father     History   Social History  . Marital Status: Widowed    Spouse Name: N/A    Number of Children: 3  . Years of Education: N/A   Occupational History  .     Social History Main Topics  . Smoking status: Never Smoker   . Smokeless tobacco: Never Used  . Alcohol Use: Yes     occasional  . Drug Use: Not on file  . Sexually Active: Not on file   Other Topics Concern  . Not on file   Social History Narrative   lives with  daughter [name]) Has 2 sons in [place] living willDaughter is health care POADiscussed DNR--will accept attempts but no prolonged artificial ventilationNo feeding tube if cognitively unaware     Review of Systems No chest pain  No palpitations No SOB Appetite hasn't been great---has lost weight Now has formed stools off the metformin    Objective:   Physical Exam  Constitutional: She appears well-developed and well-nourished. No distress.  Neck: Normal range of motion. Neck supple. No thyromegaly present.       No carotid bruits  Cardiovascular: Normal rate and regular rhythm.  Exam reveals no gallop.   Murmur heard.      Gr 3/6 aortic systolic murmur Faint PT pulses bilaterally  Pulmonary/Chest: Effort normal. No respiratory distress. She has no wheezes.       Decreased breath sounds but clear  Musculoskeletal: She exhibits no edema and no tenderness.  Lymphadenopathy:    She has no cervical adenopathy.  Neurological: She is alert. She has normal  strength. She displays no tremor. No cranial nerve deficit. She exhibits normal muscle tone. She displays a negative Romberg sign. Coordination and gait normal.  Psychiatric: She has a normal mood and affect. Her behavior is normal.          Assessment & Plan:

## 2012-02-13 NOTE — Addendum Note (Signed)
Addended by: Tillman Abide I on: 02/13/2012 03:00 PM   Modules accepted: Orders

## 2012-02-13 NOTE — Assessment & Plan Note (Signed)
2 brief episodes Not clearly mechanical Could represent TIA Will check carotids BP okay  Is on ASA

## 2012-02-23 ENCOUNTER — Encounter (INDEPENDENT_AMBULATORY_CARE_PROVIDER_SITE_OTHER): Payer: Medicare PPO

## 2012-02-23 DIAGNOSIS — R209 Unspecified disturbances of skin sensation: Secondary | ICD-10-CM

## 2012-02-23 DIAGNOSIS — R200 Anesthesia of skin: Secondary | ICD-10-CM

## 2012-02-23 DIAGNOSIS — I6529 Occlusion and stenosis of unspecified carotid artery: Secondary | ICD-10-CM

## 2012-02-27 ENCOUNTER — Other Ambulatory Visit: Payer: Self-pay

## 2012-02-27 ENCOUNTER — Other Ambulatory Visit: Payer: Self-pay | Admitting: Family Medicine

## 2012-02-27 MED ORDER — ALPRAZOLAM 0.25 MG PO TABS: 0.2500 mg | ORAL_TABLET | Freq: Every day | ORAL | Status: DC

## 2012-02-27 NOTE — Telephone Encounter (Signed)
rx called into pharmacy

## 2012-02-27 NOTE — Telephone Encounter (Signed)
Pt request refill alprazolam.Please advise since Dr Alphonsus Sias out of office.

## 2012-03-05 ENCOUNTER — Telehealth: Payer: Self-pay | Admitting: *Deleted

## 2012-03-05 NOTE — Telephone Encounter (Signed)
Tell her sorry about that That is the number that came up on the pharmacy request See if you can change that so we don't forget next time

## 2012-03-05 NOTE — Telephone Encounter (Signed)
I called pt to speak to her about lab results and pt wanted to let Dr.letvak know she only got #30 Xanax on 02/27/12 when she normally get #60, pt states she doesn't need a refill right now but she would like #60 on the next refill.

## 2012-03-05 NOTE — Telephone Encounter (Signed)
Spoke with patient and advised results   

## 2012-03-20 ENCOUNTER — Encounter: Payer: Self-pay | Admitting: Internal Medicine

## 2012-03-20 ENCOUNTER — Ambulatory Visit (INDEPENDENT_AMBULATORY_CARE_PROVIDER_SITE_OTHER): Payer: Medicare PPO | Admitting: Internal Medicine

## 2012-03-20 DIAGNOSIS — F411 Generalized anxiety disorder: Secondary | ICD-10-CM

## 2012-03-20 NOTE — Progress Notes (Signed)
  Subjective:    Patient ID: Erica Hansen, female    DOB: Mar 09, 1923, 78 y.o.   MRN: [id number]  HPI Daughter here to discuss concerns  Is concerned about her loss of weight Not sleeping well Acting anxious--some obsessive compulsive behaviors Health care, money concerns Increased laundry Only occ memory issues Still handles her own finances  She lives in basement living space They eat some meals together  Sleep problems also Wonders about taking more melatonin---is on 5mg  now  Review of Systems     Objective:   Physical Exam        Assessment & Plan:

## 2012-03-20 NOTE — Assessment & Plan Note (Addendum)
Seems to be worse per daughter Need to discuss with her and see if it is affecting her as well Could consider SSRI or if just infrequent, perhaps an occ daytime xanax dose  Could consider mirtazapine instead if continued weight loss

## 2012-03-25 ENCOUNTER — Other Ambulatory Visit: Payer: Self-pay | Admitting: Family Medicine

## 2012-03-26 ENCOUNTER — Other Ambulatory Visit: Payer: Self-pay | Admitting: *Deleted

## 2012-03-26 MED ORDER — ALPRAZOLAM 0.25 MG PO TABS: 0.2500 mg | ORAL_TABLET | Freq: Every day | ORAL | Status: DC

## 2012-03-26 NOTE — Telephone Encounter (Signed)
Okay #60 x 0 

## 2012-03-26 NOTE — Telephone Encounter (Signed)
rx called into pharmacy

## 2012-03-26 NOTE — Telephone Encounter (Signed)
Last filled 02/27/2012

## 2012-04-06 ENCOUNTER — Encounter: Payer: Self-pay | Admitting: Internal Medicine

## 2012-04-09 ENCOUNTER — Telehealth: Payer: Self-pay | Admitting: Pulmonary Disease

## 2012-04-09 MED ORDER — BENZONATATE 100 MG PO CAPS: 100.0000 mg | ORAL_CAPSULE | Freq: Four times a day (QID) | ORAL | Status: DC | PRN

## 2012-04-09 NOTE — Telephone Encounter (Signed)
PT reports that she is taking tessalon perles for non prod cough.  [facility] [place]  Also pt reports that Delsym is giving her diarrhea and wants this added to allergies.  Allergy list updated.

## 2012-04-19 ENCOUNTER — Ambulatory Visit (INDEPENDENT_AMBULATORY_CARE_PROVIDER_SITE_OTHER): Payer: Medicare PPO | Admitting: Internal Medicine

## 2012-04-19 ENCOUNTER — Encounter: Payer: Self-pay | Admitting: Internal Medicine

## 2012-04-19 VITALS — BP 130/60 | HR 67 | Temp 97.7°F | Wt 124.0 lb

## 2012-04-19 DIAGNOSIS — I10 Essential (primary) hypertension: Secondary | ICD-10-CM

## 2012-04-19 DIAGNOSIS — E1129 Type 2 diabetes mellitus with other diabetic kidney complication: Secondary | ICD-10-CM

## 2012-04-19 DIAGNOSIS — F411 Generalized anxiety disorder: Secondary | ICD-10-CM

## 2012-04-19 MED ORDER — MIRTAZAPINE 15 MG PO TABS: 15.0000 mg | ORAL_TABLET | Freq: Every day | ORAL | Status: DC

## 2012-04-19 NOTE — Assessment & Plan Note (Signed)
BP Readings from Last 3 Encounters:  04/19/12 130/60  02/13/12 126/60  01/20/12 144/56   Good control No changes If stays down, consider stopping the terazosin

## 2012-04-19 NOTE — Assessment & Plan Note (Signed)
Has been worse Not sleeping well, missing meals and losing weight Will try mirtazapine

## 2012-04-19 NOTE — Assessment & Plan Note (Signed)
Seems to have good control Will recheck a1c

## 2012-04-19 NOTE — Patient Instructions (Addendum)
Please stop the melatonin Start the mirtazapine at bedtime

## 2012-04-19 NOTE — Progress Notes (Signed)
Subjective:    Patient ID: Erica Hansen, female    DOB: [birth year], 78 y.o.   MRN: [id number]  HPI Here with daughter Has been losing weight Forgets to take meals at times  Worried about her blood sugar--"I can eat anything I want, can't I" Checks twice a week or so Highest 159 but most under 130 No low sugar reactions  Notes anxiety Really bothering her---having "attacks" Not sleeping well Uses the xanax just at night along with melatonin  No chest pain No SOB Trying to do exercise class twice a week  Current Outpatient Prescriptions on File Prior to Visit  Medication Sig Dispense Refill  . ALPRAZolam (XANAX) 0.25 MG tablet Take 1 tablet (0.25 mg total) by mouth at bedtime. And as needed during the day  60 tablet  0  . aspirin 81 MG tablet Take 81 mg by mouth daily.        . benzonatate (TESSALON) 100 MG capsule Take 1 capsule (100 mg total) by mouth every 6 (six) hours as needed for cough.  30 capsule  1  . diltiazem (CARDIZEM CD) 120 MG 24 hr capsule Take 1 capsule (120 mg total) by mouth daily.  90 capsule  3  . glucose blood test strip Test once daily or as directed dx 250.40  50 each  12  . ketoconazole (NIZORAL) 2 % cream Apply 1 application topically 2 (two) times daily as needed.  60 g  3  . Melatonin 5 MG TABS Take 1 tablet by mouth at bedtime.       . Multiple Vitamins-Minerals (CENTRUM SILVER PO) Take 1 capsule by mouth daily.        . nadolol (CORGARD) 80 MG tablet Take 1 tablet (80 mg total) by mouth daily.  90 tablet  3  . olmesartan-hydrochlorothiazide (BENICAR HCT) 40-12.5 MG per tablet Take 1 tablet by mouth daily.  90 tablet  3  . Polyethyl Glycol-Propyl Glycol (SYSTANE) 0.4-0.3 % SOLN Place 2 drops into both eyes at bedtime.       Marland Kitchen terazosin (HYTRIN) 5 MG capsule Take 1 capsule (5 mg total) by mouth every evening.  90 capsule  3    Allergies  Allergen Reactions  . Clindamycin     REACTION: diffuse maculopapular rash  . Delsym (Dextromethorphan)    Diarrhea  . Lisinopril     REACTION: cough  . Penicillins   . Sulfonamide Derivatives     Past Medical History  Diagnosis Date  . Diverticulosis   . Unspecified essential hypertension   . Osteoarthritis   . Type II or unspecified type diabetes mellitus without mention of complication, not stated as uncontrolled   . Anxiety states   . Lung fibrosis   . Urinary incontinence     Past Surgical History  Procedure Date  . Abdominal hysterectomy   . Replacement total knee   . Laminectomy   . Humerus fracture surgery     Family History  Problem Relation Age of Onset  . Colon cancer Father     History   Social History  . Marital Status: Widowed    Spouse Name: N/A    Number of Children: 3  . Years of Education: N/A   Occupational History  .     Social History Main Topics  . Smoking status: Never Smoker   . Smokeless tobacco: Never Used  . Alcohol Use: Yes     Comment: occasional  . Drug Use: Not on file  . Sexually  Active: Not on file   Other Topics Concern  . Not on file   Social History Narrative   lives with daughter Erica Hansen) [place] 2 sons in [place] living willDaughter is health care POADiscussed DNR--will accept attempts but no prolonged artificial ventilationNo feeding tube if cognitively unaware   Review of Systems Feels her bowels are better on the [place] (not all lose) Voiding okay No pain problems--some back pain in bed but not bad    Objective:   Physical Exam  Constitutional: She appears well-developed. No distress.  Neck: Normal range of motion. Neck supple.  Cardiovascular: Normal rate and regular rhythm.  Exam reveals no gallop.   Murmur heard.      Gr 3/6 aortic systolic murmur  Pulmonary/Chest: Effort normal and breath sounds normal. No respiratory distress. She has no wheezes. She has no rales.  Musculoskeletal: She exhibits no edema and no tenderness.  Lymphadenopathy:    She has no cervical adenopathy.  Psychiatric:        Not overtly depressed Some anxiety          Assessment & Plan:

## 2012-06-01 ENCOUNTER — Ambulatory Visit (INDEPENDENT_AMBULATORY_CARE_PROVIDER_SITE_OTHER): Payer: Medicare PPO | Admitting: Internal Medicine

## 2012-06-01 ENCOUNTER — Encounter: Payer: Self-pay | Admitting: Internal Medicine

## 2012-06-01 VITALS — BP 160/72 | HR 66 | Temp 97.9°F | Wt 130.5 lb

## 2012-06-01 DIAGNOSIS — F411 Generalized anxiety disorder: Secondary | ICD-10-CM

## 2012-06-01 NOTE — Assessment & Plan Note (Signed)
Really feels better with the mirtazapine Sleeping well Eating much better and has gained weight back

## 2012-06-01 NOTE — Progress Notes (Signed)
Subjective:    Patient ID: Erica Hansen, female    DOB: December 10, 1922, 79 y.o.   MRN: [id number]  HPI Here with daughter  Has tolerated the medication "it has really helped me" Sleeping much better---only getting up once at night No AM grogginess  Eating better Has gained some weight back  Nerves are better No depressed now Has had some financial issues that have been stressful---has been dealing with them  Current Outpatient Prescriptions on File Prior to Visit  Medication Sig Dispense Refill  . ALPRAZolam (XANAX) 0.25 MG tablet Take 1 tablet (0.25 mg total) by mouth at bedtime. And as needed during the day  60 tablet  0  . aspirin 81 MG tablet Take 81 mg by mouth daily.        . benzonatate (TESSALON) 100 MG capsule Take 1 capsule (100 mg total) by mouth every 6 (six) hours as needed for cough.  30 capsule  1  . diltiazem (CARDIZEM CD) 120 MG 24 hr capsule Take 1 capsule (120 mg total) by mouth daily.  90 capsule  3  . glucose blood test strip Test once daily or as directed dx 250.40  50 each  12  . mirtazapine (REMERON) 15 MG tablet Take 1 tablet (15 mg total) by mouth at bedtime.  30 tablet  11  . Multiple Vitamins-Minerals (CENTRUM SILVER PO) Take 1 capsule by mouth daily.        . nadolol (CORGARD) 80 MG tablet Take 1 tablet (80 mg total) by mouth daily.  90 tablet  3  . olmesartan-hydrochlorothiazide (BENICAR HCT) 40-12.5 MG per tablet Take 1 tablet by mouth daily.  90 tablet  3  . Polyethyl Glycol-Propyl Glycol (SYSTANE) 0.4-0.3 % SOLN Place 2 drops into both eyes at bedtime.       Marland Kitchen terazosin (HYTRIN) 5 MG capsule Take 1 capsule (5 mg total) by mouth every evening.  90 capsule  3  . ketoconazole (NIZORAL) 2 % cream Apply 1 application topically 2 (two) times daily as needed.  60 g  3    Allergies  Allergen Reactions  . Clindamycin     REACTION: diffuse maculopapular rash  . Delsym (Dextromethorphan)     Diarrhea  . Lisinopril     REACTION: cough  . Penicillins     . Sulfonamide Derivatives     Past Medical History  Diagnosis Date  . Diverticulosis   . Unspecified essential hypertension   . Osteoarthritis   . Type II or unspecified type diabetes mellitus without mention of complication, not stated as uncontrolled   . Anxiety states   . Lung fibrosis   . Urinary incontinence     Past Surgical History  Procedure Date  . Abdominal hysterectomy   . Replacement total knee   . Laminectomy   . Humerus fracture surgery     Family History  Problem Relation Age of Onset  . Colon cancer Father     History   Social History  . Marital Status: Widowed    Spouse Name: N/A    Number of Children: 3  . Years of Education: N/A   Occupational History  .     Social History Main Topics  . Smoking status: Never Smoker   . Smokeless tobacco: Never Used  . Alcohol Use: Yes     Comment: occasional  . Drug Use: Not on file  . Sexually Active: Not on file   Other Topics Concern  . Not on file  Social History Narrative   lives with daughter [name]) Has 2 sons in [place] willDaughter is health care POADiscussed DNR--will accept attempts but no prolonged artificial ventilationNo feeding tube if cognitively unaware   Review of Systems Exercise class 2 days per week Getting fall evaluation at the senior care--- through [employer]    Objective:   Physical Exam  Constitutional: She appears well-developed and well-nourished. No distress.  Psychiatric: She has a normal mood and affect. Her behavior is normal.          Assessment & Plan:

## 2012-06-06 ENCOUNTER — Other Ambulatory Visit: Payer: Self-pay | Admitting: Internal Medicine

## 2012-06-06 ENCOUNTER — Encounter: Payer: Self-pay | Admitting: Internal Medicine

## 2012-06-06 MED ORDER — ALPRAZOLAM 0.25 MG PO TABS: 0.2500 mg | ORAL_TABLET | Freq: Every day | ORAL | Status: DC

## 2012-06-06 NOTE — Telephone Encounter (Signed)
Okay #60 x 0 

## 2012-06-06 NOTE — Telephone Encounter (Signed)
rx called into pharmacy

## 2012-06-18 ENCOUNTER — Telehealth: Payer: Self-pay

## 2012-06-18 NOTE — Telephone Encounter (Signed)
Cathy called re; billing question advised to call [phone].cathy voiced understanding.

## 2012-06-25 ENCOUNTER — Telehealth: Payer: Self-pay | Admitting: Pulmonary Disease

## 2012-06-25 ENCOUNTER — Other Ambulatory Visit: Payer: Self-pay | Admitting: Pulmonary Disease

## 2012-06-25 MED ORDER — BENZONATATE 100 MG PO CAPS: 100.0000 mg | ORAL_CAPSULE | Freq: Four times a day (QID) | ORAL | Status: DC | PRN

## 2012-06-25 NOTE — Telephone Encounter (Signed)
Spoke with the pt  She states that her cough is better, but still bothers her occ She wants refill on tessalon pearles in case she has a flare Rx was sent to pharm

## 2012-06-25 NOTE — Telephone Encounter (Signed)
Pt returned call. Erica Hansen  

## 2012-06-25 NOTE — Telephone Encounter (Signed)
LMTCB

## 2012-07-23 ENCOUNTER — Ambulatory Visit (INDEPENDENT_AMBULATORY_CARE_PROVIDER_SITE_OTHER): Payer: Medicare PPO | Admitting: Pulmonary Disease

## 2012-07-23 ENCOUNTER — Encounter: Payer: Self-pay | Admitting: Pulmonary Disease

## 2012-07-23 VITALS — BP 120/64 | HR 71 | Temp 98.5°F | Ht 60.0 in | Wt 131.2 lb

## 2012-07-23 DIAGNOSIS — J8410 Pulmonary fibrosis, unspecified: Secondary | ICD-10-CM

## 2012-07-23 MED ORDER — BENZONATATE 100 MG PO CAPS: 100.0000 mg | ORAL_CAPSULE | Freq: Four times a day (QID) | ORAL | Status: DC | PRN

## 2012-07-23 NOTE — Assessment & Plan Note (Signed)
We discussed oxygen Cough perles will be refilled - try skipping am dose if not needed Would delay O2 as  Much as possible- do not want to limit her QOL Discussed EOL issues with daughter- she has living will & HCPOA, would not want life support & I concur

## 2012-07-23 NOTE — Progress Notes (Signed)
  Subjective:    Patient ID: Richetta Cubillos, female    DOB: 04-14-1923, 79 y.o.   MRN: [id number]  HPI  89/F, never smoker for FU of pulmonary fibrosis,  She was treated with empiric steroids for cough since 2/10 with good response >tapered off 9/12  Easy bruising prior to taking steroids.  She did not qualify for the ASCEND Trial due to age limit. CXR 9/12 - stable fibrosis  PFTs 7/09 >>FVC 59% - mod restriction with desaturation on exertion.  ANA neg, RA factor neg - esr 71  Pneumovax 2003  11/09 CT chest >>Pulmonary parenchymal pattern of fibrosis is most consistent with usual interstitial pneumonia (UIP). Increase in superimposed ground-glass suggests areas of active inflammation.  09/02/2011  06/06/2011 >> doxy, prednisone started & tapered off by 3/13  Takes bedtime dose of xanax daily  >> delsym + tessalon for cough     07/23/2012  pt reports her breathing has been okay. cough is worse at night, clears her throat often, no wheezing, no chest tx. pt RA sats were 83% upon exam room arrival.   Lost upto 131 lbsHad diarrhea - metformin finally dc'd  Dyspnea unchanged , satn appears lower Feels cold - TSH nml 6/13  Cough better - takes tessalon bid   Review of Systems neg for any significant sore throat, dysphagia, itching, sneezing, nasal congestion or excess/ purulent secretions, fever, chills, sweats, unintended wt loss, pleuritic or exertional cp, hempoptysis, orthopnea pnd or change in chronic leg swelling. Also denies presyncope, palpitations, heartburn, abdominal pain, nausea, vomiting, diarrhea or change in bowel or urinary habits, dysuria,hematuria, rash, arthralgias, visual complaints, headache, numbness weakness or ataxia.     Objective:   Physical Exam  Gen. Pleasant, well-nourished, in no distress ENT - no lesions, no post nasal drip Neck: No JVD, no thyromegaly, no carotid bruits Lungs: no use of accessory muscles, no dullness to percussion, BL 2/3 rales, no  rhonchi  Cardiovascular: Rhythm regular, heart sounds  normal, no murmurs or gallops, no peripheral edema Musculoskeletal: No deformities, no cyanosis or clubbing         Assessment & Plan:

## 2012-07-23 NOTE — Patient Instructions (Addendum)
We discussed oxygen Cough perles will be refilled - try skipping am dose if not needed

## 2012-08-06 ENCOUNTER — Other Ambulatory Visit: Payer: Self-pay | Admitting: Internal Medicine

## 2012-08-06 MED ORDER — ALPRAZOLAM 0.25 MG PO TABS: 0.2500 mg | ORAL_TABLET | Freq: Every day | ORAL | Status: DC

## 2012-08-06 NOTE — Telephone Encounter (Signed)
Okay #60 x 0 

## 2012-08-06 NOTE — Telephone Encounter (Signed)
rx called into pharmacy

## 2012-09-27 ENCOUNTER — Encounter: Payer: Self-pay | Admitting: Internal Medicine

## 2012-09-27 ENCOUNTER — Ambulatory Visit (INDEPENDENT_AMBULATORY_CARE_PROVIDER_SITE_OTHER): Payer: Medicare PPO | Admitting: Internal Medicine

## 2012-09-27 ENCOUNTER — Other Ambulatory Visit: Payer: Self-pay | Admitting: *Deleted

## 2012-09-27 VITALS — BP 136/74 | HR 63 | Temp 98.3°F | Wt 133.5 lb

## 2012-09-27 DIAGNOSIS — I10 Essential (primary) hypertension: Secondary | ICD-10-CM

## 2012-09-27 DIAGNOSIS — J8410 Pulmonary fibrosis, unspecified: Secondary | ICD-10-CM

## 2012-09-27 DIAGNOSIS — F411 Generalized anxiety disorder: Secondary | ICD-10-CM

## 2012-09-27 DIAGNOSIS — E1129 Type 2 diabetes mellitus with other diabetic kidney complication: Secondary | ICD-10-CM

## 2012-09-27 LAB — HEPATIC FUNCTION PANEL
AST: 18 U/L (ref 0–37)
Albumin: 3.4 g/dL — ABNORMAL LOW (ref 3.5–5.2)
Alkaline Phosphatase: 74 U/L (ref 39–117)
Total Protein: 7.3 g/dL (ref 6.0–8.3)

## 2012-09-27 LAB — BASIC METABOLIC PANEL
BUN: 36 mg/dL — ABNORMAL HIGH (ref 6–23)
Chloride: 103 mEq/L (ref 96–112)
Glucose, Bld: 105 mg/dL — ABNORMAL HIGH (ref 70–99)
Potassium: 4.9 mEq/L (ref 3.5–5.1)

## 2012-09-27 LAB — CBC WITH DIFFERENTIAL/PLATELET
Basophils Relative: 0.4 % (ref 0.0–3.0)
Eosinophils Relative: 5.2 % — ABNORMAL HIGH (ref 0.0–5.0)
Lymphocytes Relative: 21 % (ref 12.0–46.0)
Neutrophils Relative %: 66.5 % (ref 43.0–77.0)
RBC: 4.13 Mil/uL (ref 3.87–5.11)
WBC: 11.5 10*3/uL — ABNORMAL HIGH (ref 4.5–10.5)

## 2012-09-27 LAB — TSH: TSH: 2.89 u[IU]/mL (ref 0.35–5.50)

## 2012-09-27 LAB — LIPID PANEL
LDL Cholesterol: 88 mg/dL (ref 0–99)
VLDL: 34.4 mg/dL (ref 0.0–40.0)

## 2012-09-27 MED ORDER — TERAZOSIN HCL 5 MG PO CAPS: 5.0000 mg | ORAL_CAPSULE | Freq: Every evening | ORAL | Status: DC

## 2012-09-27 MED ORDER — ALPRAZOLAM 0.25 MG PO TABS: 0.2500 mg | ORAL_TABLET | Freq: Every day | ORAL | Status: DC

## 2012-09-27 MED ORDER — NADOLOL 80 MG PO TABS: 80.0000 mg | ORAL_TABLET | Freq: Every day | ORAL | Status: DC

## 2012-09-27 MED ORDER — OLMESARTAN MEDOXOMIL-HCTZ 40-12.5 MG PO TABS: 1.0000 | ORAL_TABLET | Freq: Every day | ORAL | Status: DC

## 2012-09-27 MED ORDER — DILTIAZEM HCL ER COATED BEADS 120 MG PO CP24: 120.0000 mg | ORAL_CAPSULE | Freq: Every day | ORAL | Status: DC

## 2012-09-27 NOTE — Assessment & Plan Note (Signed)
Stable status Follows with Dr Vassie Loll

## 2012-09-27 NOTE — Assessment & Plan Note (Signed)
Seems to have good control Due for labs 

## 2012-09-27 NOTE — Assessment & Plan Note (Signed)
Does okay with evening alprazolam

## 2012-09-27 NOTE — Assessment & Plan Note (Signed)
BP Readings from Last 3 Encounters:  09/27/12 136/74  07/23/12 120/64  06/01/12 160/72   Good control No changes needed

## 2012-09-27 NOTE — Telephone Encounter (Signed)
Pt was seen today for appt Dr. Alphonsus Sias advise me to refill pt's meds for 1 year and the xanax for 1 month, Rx's sent to pharmacy and xanax called in as prescribed

## 2012-09-27 NOTE — Progress Notes (Signed)
Subjective:    Patient ID: Erica Hansen, female    DOB: Jun 10, 1922, 79 y.o.   MRN: [id number]  HPI Here with daughter Olegario Messier  Recent appt with Dr Vassie Loll No changes Still has some dyspnea---like if extended walking or showering Seems to have SOB at first when she lies down, then that resolves  Still sleeps well Has gained a few more pounds back Still happy with the mirtazapine  Doesn't like doing the blood sugars Has done 2 this month--- 135, 114 No hypoglycemic reactions Saw eye doctor last September---no problems  Uses the xanax every night Hasn't needed it during the day  Current Outpatient Prescriptions on File Prior to Visit  Medication Sig Dispense Refill  . ALPRAZolam (XANAX) 0.25 MG tablet Take 1 tablet (0.25 mg total) by mouth at bedtime. And as needed during the day  60 tablet  0  . aspirin 81 MG tablet Take 81 mg by mouth daily.        . benzonatate (TESSALON) 100 MG capsule Take 1 capsule (100 mg total) by mouth every 6 (six) hours as needed for cough.  30 capsule  5  . diltiazem (CARDIZEM CD) 120 MG 24 hr capsule Take 1 capsule (120 mg total) by mouth daily.  90 capsule  3  . glucose blood test strip Test once daily or as directed dx 250.40  50 each  12  . ketoconazole (NIZORAL) 2 % cream Apply 1 application topically 2 (two) times daily as needed.  60 g  3  . mirtazapine (REMERON) 15 MG tablet Take 1 tablet (15 mg total) by mouth at bedtime.  30 tablet  11  . Multiple Vitamins-Minerals (CENTRUM SILVER PO) Take 1 capsule by mouth daily.        . nadolol (CORGARD) 80 MG tablet Take 1 tablet (80 mg total) by mouth daily.  90 tablet  3  . olmesartan-hydrochlorothiazide (BENICAR HCT) 40-12.5 MG per tablet Take 1 tablet by mouth daily.  90 tablet  3  . Polyethyl Glycol-Propyl Glycol (SYSTANE) 0.4-0.3 % SOLN Place 2 drops into both eyes at bedtime.       Marland Kitchen terazosin (HYTRIN) 5 MG capsule Take 1 capsule (5 mg total) by mouth every evening.  90 capsule  3   No current  facility-administered medications on file prior to visit.    Allergies  Allergen Reactions  . Clindamycin     REACTION: diffuse maculopapular rash  . Delsym (Dextromethorphan)     Diarrhea  . Lisinopril     REACTION: cough  . Penicillins   . Sulfonamide Derivatives     Past Medical History  Diagnosis Date  . Diverticulosis   . Unspecified essential hypertension   . Osteoarthritis   . Type II or unspecified type diabetes mellitus without mention of complication, not stated as uncontrolled   . Anxiety states   . Lung fibrosis   . Urinary incontinence     Past Surgical History  Procedure Laterality Date  . Abdominal hysterectomy    . Replacement total knee    . Laminectomy    . Humerus fracture surgery      Family History  Problem Relation Age of Onset  . Colon cancer Father     History   Social History  . Marital Status: Widowed    Spouse Name: N/A    Number of Children: 3  . Years of Education: N/A   Occupational History  .     Social History Main Topics  .  Smoking status: Never Smoker   . Smokeless tobacco: Never Used  . Alcohol Use: Yes     Comment: occasional  . Drug Use: Not on file  . Sexually Active: Not on file   Other Topics Concern  . Not on file   Social History Narrative   lives with daughter [name]) Has 2 sons in [place]   Has living will   Daughter is health care POA   Discussed DNR--will accept attempts but no prolonged artificial ventilation   No feeding tube if cognitively unaware   Review of Systems Appetite is okay Bowels are okay--uses dulcolax rarely      Objective:   Physical Exam  Constitutional: She appears well-developed and well-nourished. No distress.  Neck: Normal range of motion. Neck supple. No thyromegaly present.  Cardiovascular: Normal rate, regular rhythm and intact distal pulses.  Exam reveals no gallop.   Murmur heard. Faint distal pulses Gr 2/6 systolic murmur  Pulmonary/Chest: Effort normal.  No respiratory distress. She has no wheezes. She has rales.  Fine inspiratory crackles throughout  Abdominal: Soft. There is no tenderness.  Musculoskeletal: She exhibits no edema and no tenderness.  Lymphadenopathy:    She has no cervical adenopathy.  Skin:  No foot lesions  Psychiatric: She has a normal mood and affect. Her behavior is normal.          Assessment & Plan:

## 2012-10-10 ENCOUNTER — Other Ambulatory Visit: Payer: Self-pay | Admitting: Internal Medicine

## 2012-10-10 MED ORDER — MIRTAZAPINE 15 MG PO TABS: 15.0000 mg | ORAL_TABLET | Freq: Every day | ORAL | Status: DC

## 2012-10-10 NOTE — Telephone Encounter (Signed)
Okay for a year 

## 2012-10-10 NOTE — Telephone Encounter (Signed)
rx sent to pharmacy by e-script  

## 2012-10-10 NOTE — Telephone Encounter (Signed)
Received fax refill request for a 90 day supply, please advise

## 2012-11-06 ENCOUNTER — Other Ambulatory Visit: Payer: Self-pay | Admitting: Internal Medicine

## 2012-11-06 MED ORDER — ALPRAZOLAM 0.25 MG PO TABS: 0.2500 mg | ORAL_TABLET | Freq: Every day | ORAL | Status: DC

## 2012-11-06 NOTE — Telephone Encounter (Signed)
rx called into pharmacy

## 2012-11-06 NOTE — Telephone Encounter (Signed)
Okay #60 x 0 

## 2012-11-22 ENCOUNTER — Other Ambulatory Visit: Payer: Self-pay

## 2012-12-03 ENCOUNTER — Emergency Department: Payer: Self-pay | Admitting: Emergency Medicine

## 2012-12-16 ENCOUNTER — Encounter: Payer: Self-pay | Admitting: Internal Medicine

## 2013-01-02 ENCOUNTER — Encounter: Payer: Self-pay | Admitting: Internal Medicine

## 2013-01-04 ENCOUNTER — Encounter: Payer: Self-pay | Admitting: Internal Medicine

## 2013-01-04 NOTE — Telephone Encounter (Signed)
Spoke with sister and she's on her way to [place] to pickup pt now and she will give [place] a call if she needs that appt.

## 2013-01-07 ENCOUNTER — Encounter: Payer: Self-pay | Admitting: Internal Medicine

## 2013-02-06 ENCOUNTER — Ambulatory Visit (INDEPENDENT_AMBULATORY_CARE_PROVIDER_SITE_OTHER): Payer: Medicare PPO | Admitting: Pulmonary Disease

## 2013-02-06 ENCOUNTER — Telehealth: Payer: Self-pay | Admitting: *Deleted

## 2013-02-06 ENCOUNTER — Ambulatory Visit: Payer: Medicare PPO | Admitting: Pulmonary Disease

## 2013-02-06 ENCOUNTER — Encounter: Payer: Self-pay | Admitting: Pulmonary Disease

## 2013-02-06 ENCOUNTER — Telehealth: Payer: Self-pay | Admitting: Pulmonary Disease

## 2013-02-06 VITALS — BP 140/74 | HR 71 | Temp 97.4°F | Ht 60.0 in | Wt 131.2 lb

## 2013-02-06 DIAGNOSIS — Z23 Encounter for immunization: Secondary | ICD-10-CM

## 2013-02-06 DIAGNOSIS — J8410 Pulmonary fibrosis, unspecified: Secondary | ICD-10-CM

## 2013-02-06 MED ORDER — BENZONATATE 100 MG PO CAPS: 100.0000 mg | ORAL_CAPSULE | Freq: Three times a day (TID) | ORAL | Status: DC | PRN

## 2013-02-06 NOTE — Patient Instructions (Addendum)
Flu shot Ok to take robitussin 90 caps of benzonatate x 3 refills We discussed possible need for oxygen in the future & new drug for fibrosis

## 2013-02-06 NOTE — Progress Notes (Signed)
  Subjective:    Patient ID: Erica Hansen, female    DOB: January 08, 1923, 79 y.o.   MRN: [id number]  HPI  [name], never smoker for FU of pulmonary fibrosis,  She was treated with empiric steroids for cough since 2/10 with good response >tapered off 9/12  She did not qualify for the ASCEND Trial due to age limit.  PFTs 7/09 >>FVC 59% - mod restriction with desaturation on exertion.  ANA neg, RA factor neg - esr 71  Pneumovax 2003  11/09 CT chest >>Pulmonary parenchymal pattern of fibrosis is most consistent with usual interstitial pneumonia (UIP). Increase in superimposed ground-glass suggests areas of active inflammation.  06/06/2011 >> doxy, prednisone started & tapered off by 3/13    02/06/2013  pt reports her breathing has been okay. cough is unchanged, delsym + tessalon helps -needs more pills Dyspnea unchanged , satn appears lower on walking but not ready for oxygen [place] fall in [place]  Review of Systems neg for any significant sore throat, dysphagia, itching, sneezing, nasal congestion or excess/ purulent secretions, fever, chills, sweats, unintended wt loss, pleuritic or exertional cp, hempoptysis, orthopnea pnd or change in chronic leg swelling. Also denies presyncope, palpitations, heartburn, abdominal pain, nausea, vomiting, diarrhea or change in bowel or urinary habits, dysuria,hematuria, rash, arthralgias, visual complaints, headache, numbness weakness or ataxia.     Objective:   Physical Exam  Gen. Pleasant, well-nourished, in no distress ENT - no lesions, no post nasal drip Neck: No JVD, no thyromegaly, no carotid bruits Lungs: no use of accessory muscles, no dullness to percussion,bibasal rales , no rhonchi  Cardiovascular: Rhythm regular, heart sounds  normal, no murmurs or gallops, no peripheral edema Musculoskeletal: No deformities, no cyanosis or clubbing        Assessment & Plan:

## 2013-02-06 NOTE — Telephone Encounter (Signed)
Spoke with patient that it's time to schedule her carotid doppler. She stated that she couldn't make the appt at that time and that she will our office back.

## 2013-02-06 NOTE — Telephone Encounter (Signed)
I spoke with pt. She clarifying RX. She wants to know if she can take the pearls BID and not TID. I advised her she can take this TID PRN. If she wants to take BID then that is fine. Nothing further needed

## 2013-02-06 NOTE — Assessment & Plan Note (Addendum)
Flu shot Ok to take robitussin 90 caps of benzonatate x 3 refills We discussed possible need for oxygen in the future -she would like to defer as long as possible  We discussed new drug for fibrosis - she is NOT a candidate

## 2013-03-06 ENCOUNTER — Encounter (INDEPENDENT_AMBULATORY_CARE_PROVIDER_SITE_OTHER): Payer: Medicare PPO

## 2013-03-06 DIAGNOSIS — I6529 Occlusion and stenosis of unspecified carotid artery: Secondary | ICD-10-CM

## 2013-03-13 ENCOUNTER — Other Ambulatory Visit: Payer: Self-pay | Admitting: Internal Medicine

## 2013-03-18 ENCOUNTER — Encounter: Payer: Self-pay | Admitting: Pulmonary Disease

## 2013-03-18 LAB — HM DIABETES EYE EXAM

## 2013-03-18 NOTE — Telephone Encounter (Signed)
Called and spoke with pt- appt scheduled.

## 2013-03-19 ENCOUNTER — Ambulatory Visit (INDEPENDENT_AMBULATORY_CARE_PROVIDER_SITE_OTHER): Payer: Medicare PPO | Admitting: Internal Medicine

## 2013-03-19 ENCOUNTER — Ambulatory Visit (INDEPENDENT_AMBULATORY_CARE_PROVIDER_SITE_OTHER)
Admission: RE | Admit: 2013-03-19 | Discharge: 2013-03-19 | Disposition: A | Discharge status: Home / Self Care | Payer: Medicare PPO | Source: Ambulatory Visit | Attending: Internal Medicine | Admitting: Internal Medicine

## 2013-03-19 ENCOUNTER — Encounter: Payer: Self-pay | Admitting: Internal Medicine

## 2013-03-19 ENCOUNTER — Other Ambulatory Visit (INDEPENDENT_AMBULATORY_CARE_PROVIDER_SITE_OTHER): Payer: Medicare PPO

## 2013-03-19 VITALS — BP 150/60 | HR 70 | Temp 97.4°F | Ht 58.75 in | Wt 129.0 lb

## 2013-03-19 DIAGNOSIS — I10 Essential (primary) hypertension: Secondary | ICD-10-CM

## 2013-03-19 DIAGNOSIS — J9610 Chronic respiratory failure, unspecified whether with hypoxia or hypercapnia: Secondary | ICD-10-CM

## 2013-03-19 DIAGNOSIS — J8410 Pulmonary fibrosis, unspecified: Secondary | ICD-10-CM

## 2013-03-19 MED ORDER — FAMOTIDINE 20 MG PO TABS: ORAL_TABLET | ORAL | Status: DC

## 2013-03-19 MED ORDER — PANTOPRAZOLE SODIUM 40 MG PO TBEC: 40.0000 mg | DELAYED_RELEASE_TABLET | Freq: Every day | ORAL | Status: DC

## 2013-03-19 NOTE — Progress Notes (Signed)
  Subjective:    Patient ID: Erica Hansen, female    DOB: [birth year]    MRN: [id number]  HPI  [name], never smoker for FU of pulmonary fibrosis,  She was treated with empiric steroids for cough since 2/10 with good response >tapered off 9/12  She did not qualify for the ASCEND Trial due to age limit.  PFTs 7/09 >>FVC 59% - mod restriction with desaturation on exertion.  ANA neg, RA factor neg - esr 71  Pneumovax 2003  11/09 CT chest >>Pulmonary parenchymal pattern of fibrosis is most consistent with usual interstitial pneumonia (UIP). Increase in superimposed ground-glass suggests areas of active inflammation.  06/06/2011 >> doxy, prednisone started & tapered off by 3/13    02/06/2013  pt reports her breathing has been okay. cough is unchanged, delsym + tessalon helps -needs more pills Dyspnea unchanged , satn appears lower on walking but not ready for oxygen [place] fall in [place] rec dFlu shot Ok to take robitussin 90 caps of benzonatate x 3 refills   03/19/2013 f/u ov/Waleed Dettman re: pf/ cough and sob  Chief Complaint  Patient presents with  . Acute Visit    Pt c/o increased cough for the pasgt 2 days- prod with minimal white sputum.    already qualified for 02 but has resisted it to date "the tanks are too big" No sob at rest or sleeping  No obvious day to day or daytime variabilty or assoc  cp or chest tightness, subjective wheeze overt sinus or hb symptoms. No unusual exp hx or h/o childhood pna/ asthma or knowledge of premature birth.  Sleeping ok without nocturnal  or early am exacerbation  of respiratory  c/o's or need for noct saba. Also denies any obvious fluctuation of symptoms with weather or environmental changes or other aggravating or alleviating factors except as outlined above   Current Medications, Allergies, Complete Past Medical History, Past Surgical History, Family History, and Social History were reviewed in [employer] record.  ROS   The following are not active complaints unless bolded sore throat, dysphagia, dental problems, itching, sneezing,  nasal congestion or excess/ purulent secretions, ear ache,   fever, chills, sweats, unintended wt loss, pleuritic or exertional cp, hemoptysis,  orthopnea pnd or leg swelling, presyncope, palpitations, heartburn, abdominal pain, anorexia, nausea, vomiting, diarrhea  or change in bowel or urinary habits, change in stools or urine, dysuria,hematuria,  rash, arthralgias, visual complaints, headache, numbness weakness or ataxia or problems with walking or coordination,  change in mood/affect or memory.           Objective:   Physical Exam  Wt Readings from Last 3 Encounters:  03/19/13 129 lb (58.514 kg)  02/06/13 131 lb 3.2 oz (59.512 kg)  09/27/12 133 lb 8 oz (60.555 kg)       Gen. Pleasant, well-nourished, in no distress ENT - no lesions, no post nasal drip Neck: No JVD, no thyromegaly, no carotid bruits Lungs: no use of accessory muscles, no dullness to percussion, diffuse insp crackles = bilaterally  Cardiovascular: Rhythm regular, heart sounds  normal, no murmurs or gallops, no peripheral edema Musculoskeletal: No deformities, no cyanosis or clubbing    CXR  03/19/2013 :   Chronic pulmonary fibrosis with superimposed acute pulmonary opacity, favor acute bilateral infectious exacerbation / pneumonia.     Assessment & Plan:

## 2013-03-19 NOTE — Assessment & Plan Note (Signed)
-   03/19/2013   Walked 2lpmx one lap @ 185 stopped due to  82% - 03/19/2013  RA rest = 76% / 2lpm at rest = 90%  rx 2lpm 24/7 x 4 lpm with activity

## 2013-03-19 NOTE — Patient Instructions (Addendum)
Please see patient coordinator before you leave today  to schedule 24 h 02 as follows At rest 2lpm With activity 4lpm When sleeping 2lpm  If cough take tessalon up to every 4 hrs as needed   Pantoprazole (protonix) 40 mg   Take 30-60 min before first meal of the day and Pepcid 20 mg one bedtime until return to office - this is the best way to tell whether stomach acid is contributing to your problem.    GERD (REFLUX)  is an extremely common cause of respiratory symptoms, many times with no significant heartburn at all.    It can be treated with medication, but also with lifestyle changes including avoidance of late meals, excessive alcohol, smoking cessation, and avoid fatty foods, chocolate, peppermint, colas, red wine, and acidic juices such as orange juice.  NO MINT OR MENTHOL PRODUCTS SO NO COUGH DROPS  USE SUGARLESS CANDY INSTEAD (jolley ranchers or Stover's)  NO OIL BASED VITAMINS - use powdered substitutes.  Please remember to go to the lab and x-ray department downstairs for your tests - we will call you with the results when they are available.

## 2013-03-19 NOTE — Assessment & Plan Note (Signed)
PFTs 7/09 >>FVC 59% - mod restriction with desaturation on exertion.  ANA neg, RA factor neg - esr 71  Pneumovax 2003  11/09 CT chest >>Pulmonary parenchymal pattern of fibrosis is most consistent with usual interstitial pneumonia (UIP). Increase in superimposed ground-glass suggests areas of active inflammation.  - 03/19/2013 cxr suggest worse alveolitis > rx 6 days pred then ov 2 weeks  Also concerned about occult gerd at age 79 Use of PPI is associated with improved survival time and with decreased radiologic fibrosis per King's study published in [facility] vol 184 p1390.  Dec 2011  This may not be cause and effect, but given how universally unhelpful all the otherstudy drugs have been for pf,   rec start  rx ppi / diet/ lifestyle modification.

## 2013-03-20 ENCOUNTER — Other Ambulatory Visit: Payer: Self-pay | Admitting: Internal Medicine

## 2013-03-20 ENCOUNTER — Telehealth: Payer: Self-pay | Admitting: Internal Medicine

## 2013-03-20 LAB — BASIC METABOLIC PANEL
CO2: 31 mEq/L (ref 19–32)
Creatinine, Ser: 1.2 mg/dL (ref 0.4–1.2)
Glucose, Bld: 120 mg/dL — ABNORMAL HIGH (ref 70–99)
Potassium: 5.4 mEq/L — ABNORMAL HIGH (ref 3.5–5.1)

## 2013-03-20 MED ORDER — PREDNISONE (PAK) 10 MG PO TABS: ORAL_TABLET | Freq: Every day | ORAL | Status: DC

## 2013-03-20 NOTE — Progress Notes (Signed)
Quick Note:  Spoke with pt and notified of results per Dr. Wert. Pt verbalized understanding and denied any questions.  ______ 

## 2013-03-20 NOTE — Progress Notes (Signed)
Quick Note:  Pt aware ______ 

## 2013-03-20 NOTE — Telephone Encounter (Signed)
Received a PA for prednisone  PA number [phone]  Called to initiate PA and was not able to peak with anyone after 5 attempts! I spoke with the pt and she states that she was able to get med with no problem  Nothing further needed

## 2013-03-27 ENCOUNTER — Encounter: Payer: Self-pay | Admitting: Internal Medicine

## 2013-03-28 NOTE — Telephone Encounter (Signed)
Spoke with daughter and she will bring pt by and let me check her oxygen.

## 2013-03-29 ENCOUNTER — Encounter: Payer: Self-pay | Admitting: Internal Medicine

## 2013-04-05 ENCOUNTER — Ambulatory Visit (INDEPENDENT_AMBULATORY_CARE_PROVIDER_SITE_OTHER): Payer: Medicare PPO | Admitting: Internal Medicine

## 2013-04-05 ENCOUNTER — Encounter: Payer: Self-pay | Admitting: Internal Medicine

## 2013-04-05 VITALS — BP 140/80 | HR 70 | Temp 98.0°F | Ht 60.0 in | Wt 131.0 lb

## 2013-04-05 DIAGNOSIS — J8410 Pulmonary fibrosis, unspecified: Secondary | ICD-10-CM

## 2013-04-05 DIAGNOSIS — I10 Essential (primary) hypertension: Secondary | ICD-10-CM

## 2013-04-05 DIAGNOSIS — Z0000 Encounter for general adult medical examination without abnormal findings: Secondary | ICD-10-CM

## 2013-04-05 DIAGNOSIS — N183 Chronic kidney disease, stage 3 unspecified: Secondary | ICD-10-CM

## 2013-04-05 DIAGNOSIS — E1129 Type 2 diabetes mellitus with other diabetic kidney complication: Secondary | ICD-10-CM

## 2013-04-05 LAB — HEMOGLOBIN A1C: Hgb A1c MFr Bld: 7.2 % — ABNORMAL HIGH (ref 4.6–6.5)

## 2013-04-05 NOTE — Progress Notes (Signed)
Subjective:    Patient ID: Erica Hansen, female    DOB: 09-22-1922, 79 y.o.   MRN: [id number]  HPI Here for physical Daughter Olegario Messier is here  Reviewed her fall Finally feeling better Really had syncopal attack Will stop the terazosin just in case I would accept her systolic BP up to 160  Checks sugars once a week Usually under 130 No low sugar reactions  mainn problem is her breathing Up to 4l of oxygen so she has the big portable tank This limits her going places Still working on proper breathing--- will still breathe through mouth  Takes off oxygen for shower---seems to do okay  Reviewed her wellness form Some mood issues but not anhedonic Reviewed advanced directives  Current Outpatient Prescriptions on File Prior to Visit  Medication Sig Dispense Refill  . Acetaminophen (TYLENOL ARTHRITIS PAIN PO) Take 1 tablet by mouth 2 (two) times daily as needed.      . ALPRAZolam (XANAX) 0.25 MG tablet Take 1 tablet (0.25 mg total) by mouth at bedtime. And as needed during the day  60 tablet  0  . aspirin 81 MG tablet Take 81 mg by mouth daily.        Marland Kitchen diltiazem (CARDIZEM CD) 120 MG 24 hr capsule Take 1 capsule (120 mg total) by mouth daily.  90 capsule  3  . famotidine (PEPCID) 20 MG tablet One at bedtime  30 tablet  2  . glucose blood (TRUETEST TEST) test strip 1 each by Other route daily. Dx: 250.40  50 each  1  . mirtazapine (REMERON) 15 MG tablet Take 1 tablet (15 mg total) by mouth at bedtime.  90 tablet  3  . Multiple Vitamins-Minerals (CENTRUM SILVER PO) Take 1 capsule by mouth daily.        . nadolol (CORGARD) 80 MG tablet Take 1 tablet (80 mg total) by mouth daily.  90 tablet  3  . olmesartan-hydrochlorothiazide (BENICAR HCT) 40-12.5 MG per tablet Take 1 tablet by mouth daily.  90 tablet  3  . pantoprazole (PROTONIX) 40 MG tablet Take 1 tablet (40 mg total) by mouth daily. Take 30-60 min before first meal of the day  30 tablet  2  . Polyethyl Glycol-Propyl Glycol  (SYSTANE) 0.4-0.3 % SOLN Place 2 drops into both eyes at bedtime.        No current facility-administered medications on file prior to visit.    Allergies  Allergen Reactions  . Clindamycin     REACTION: diffuse maculopapular rash  . Delsym [Dextromethorphan]     Diarrhea  . Lisinopril     REACTION: cough  . Penicillins   . Sulfonamide Derivatives     Past Medical History  Diagnosis Date  . Diverticulosis   . Unspecified essential hypertension   . Osteoarthritis   . Type II or unspecified type diabetes mellitus without mention of complication, not stated as uncontrolled   . Anxiety states   . Lung fibrosis   . Urinary incontinence     Past Surgical History  Procedure Laterality Date  . Abdominal hysterectomy    . Replacement total knee    . Laminectomy    . Humerus fracture surgery      Family History  Problem Relation Age of Onset  . Colon cancer Father     History   Social History  . Marital Status: Widowed    Spouse Name: N/A    Number of Children: 3  . Years of Education: N/A  Occupational History  .     Social History Main Topics  . Smoking status: Never Smoker   . Smokeless tobacco: Never Used  . Alcohol Use: Yes     Comment: occasional  . Drug Use: Not on file  . Sexual Activity: Not on file   Other Topics Concern  . Not on file   Social History Narrative   lives with daughter [name]) Has 2 sons in [place]   Has living will   Daughter is health care POA   Discussed DNR--will accept attempts but no prolonged artificial ventilation   No feeding tube if cognitively unaware   Review of Systems  Constitutional: Negative for fatigue and unexpected weight change.  HENT: Positive for dental problem and hearing loss. Negative for congestion, rhinorrhea and tinnitus.        Lost 5 teeth with her fall--- has partial on top that is permanent  Eyes: Negative for visual disturbance.  Respiratory: Positive for cough and shortness of breath.         Using OTC cough med  Cardiovascular: Positive for leg swelling. Negative for chest pain and palpitations.  Gastrointestinal: Positive for constipation. Negative for nausea, vomiting and blood in stool.       On pantoprazole---no symptoms on this Uses dulcolax prn for bowels  Endocrine: Positive for cold intolerance. Negative for heat intolerance.  Genitourinary: Negative for dysuria, hematuria and difficulty urinating.  Musculoskeletal: Positive for arthralgias and back pain. Negative for joint swelling.       Arthritis pain not that bad--uses acetaminophen  Skin: Negative for rash.       Has abnormal spot on right hand--looks likely neoplastic---has derm appt  Allergic/Immunologic: Negative for environmental allergies and immunocompromised state.  Neurological: Positive for syncope. Negative for dizziness, light-headedness and headaches.  Hematological: Negative for adenopathy. Does not bruise/bleed easily.  Psychiatric/Behavioral: Negative for sleep disturbance and dysphoric mood. The patient is nervous/anxious.        Sleeping okay on the mirtazapine---hasn't needed xanax at night now       Objective:   Physical Exam  Constitutional: She appears well-developed. No distress.  HENT:  Head: Normocephalic and atraumatic.  Right Ear: External ear normal.  Left Ear: External ear normal.  Mouth/Throat: Oropharynx is clear and moist. No oropharyngeal exudate.  Eyes: Conjunctivae and EOM are normal. Pupils are equal, round, and reactive to light.  Neck: Normal range of motion. No thyromegaly present.  Cardiovascular: Normal rate and regular rhythm.  Exam reveals no gallop.   Gr 3/6 systolic murmur Faint pedal pulses  Pulmonary/Chest: Effort normal. No respiratory distress. She has no wheezes. She has rales.  Dry crackles mostly throughout (fainter in upper lobes)  Abdominal: Soft. There is no tenderness.  Musculoskeletal: She exhibits edema.  1+ edema in calves   Lymphadenopathy:    She has no cervical adenopathy.  Skin:  No foot ulcers Mycotic 1st toenails SCC vs dermatofibroma on right hand  Psychiatric: She has a normal mood and affect. Her behavior is normal.          Assessment & Plan:

## 2013-04-05 NOTE — Assessment & Plan Note (Signed)
UTD with imms No cancer screening due to age

## 2013-04-05 NOTE — Progress Notes (Signed)
Pre-visit discussion using our clinic review tool. No additional management support is needed unless otherwise documented below in the visit note.  

## 2013-04-05 NOTE — Assessment & Plan Note (Signed)
No action needed on this now

## 2013-04-05 NOTE — Assessment & Plan Note (Signed)
BP Readings from Last 3 Encounters:  04/05/13 140/80  03/19/13 150/60  02/06/13 140/74   Will stop the terazosin--- could have been part of the problem with her syncopal attack I would accept BP up some

## 2013-04-05 NOTE — Assessment & Plan Note (Signed)
Her major problem now Continues with pulmonary

## 2013-04-05 NOTE — Assessment & Plan Note (Signed)
Seems to have good control Will recheck 

## 2013-04-05 NOTE — Patient Instructions (Signed)
Please stop the terazosin

## 2013-04-10 ENCOUNTER — Ambulatory Visit (INDEPENDENT_AMBULATORY_CARE_PROVIDER_SITE_OTHER): Payer: Medicare PPO | Admitting: Pulmonary Disease

## 2013-04-10 ENCOUNTER — Encounter: Payer: Self-pay | Admitting: Pulmonary Disease

## 2013-04-10 ENCOUNTER — Other Ambulatory Visit: Payer: Self-pay | Admitting: Dermatology

## 2013-04-10 VITALS — BP 110/76 | HR 62 | Ht 58.75 in | Wt 132.0 lb

## 2013-04-10 DIAGNOSIS — J9610 Chronic respiratory failure, unspecified whether with hypoxia or hypercapnia: Secondary | ICD-10-CM

## 2013-04-10 DIAGNOSIS — J8410 Pulmonary fibrosis, unspecified: Secondary | ICD-10-CM

## 2013-04-10 MED ORDER — FUROSEMIDE 20 MG PO TABS: 20.0000 mg | ORAL_TABLET | Freq: Every day | ORAL | Status: DC

## 2013-04-10 MED ORDER — PREDNISONE 5 MG PO TABS: 5.0000 mg | ORAL_TABLET | Freq: Every day | ORAL | Status: DC

## 2013-04-10 NOTE — Patient Instructions (Signed)
Start on prednisone 5mg  daily Take lasix 20 mg daily x 10 days We will have DME evaluate you for pulse dosing of oxygen Take pepcid at bedtime daily Ok to stay on cough meds

## 2013-04-10 NOTE — Progress Notes (Signed)
   Subjective:    Patient ID: Erica Hansen, female    DOB: 1922-12-17, 79 y.o.   MRN: [id number]  HPI  [name], never smoker for FU of pulmonary fibrosis,  She was treated with empiric steroids for cough since 2/10 with good response  She did not qualify for the ASCEND Trial due to age limit.  PFTs 7/09 >>FVC 59% - mod restriction with desaturation on exertion.  ANA neg, RA factor neg - esr 71  Pneumovax 2003  11/09 CT chest >>Pulmonary parenchymal pattern of fibrosis is most consistent with usual interstitial pneumonia (UIP). Increase in superimposed ground-glass suggests areas of active inflammation.    04/10/2013  Chief Complaint  Patient presents with  . Follow-up    Breathing is okay. Pt reports cough is some better. Does not bring up veyr much white phlem. denies any wheezing and no chest tx   She was qualified for 02 but has resisted it -finally started on O2 on last visit 03/2013  She is frustrated but compliant, portable O2 only lasts 2h - she cancelled a trip due to this reason - 03/19/2013 cxr suggest worse alveolitis > Given short course of pred, Also started on pepcid for GERD C/o increased bipedal edema Increased cough on round the clock tessalon  Past Medical History  Diagnosis Date  . Diverticulosis   . Unspecified essential hypertension   . Osteoarthritis   . Type II or unspecified type diabetes mellitus without mention of complication, not stated as uncontrolled   . Anxiety states   . Lung fibrosis   . Urinary incontinence      Review of Systems neg for any significant sore throat, dysphagia, itching, sneezing, nasal congestion or excess/ purulent secretions, fever, chills, sweats, unintended wt loss, pleuritic or exertional cp, hempoptysis, orthopnea pnd or change in chronic leg swelling. Also denies presyncope, palpitations, heartburn, abdominal pain, nausea, vomiting, diarrhea or change in bowel or urinary habits, dysuria,hematuria, rash, arthralgias,  visual complaints, headache, numbness weakness or ataxia.     Objective:   Physical Exam  Gen. Pleasant, well-nourished, in no distress, normal affect ENT - no lesions, no post nasal drip Neck: No JVD, no thyromegaly, no carotid bruits Lungs: no use of accessory muscles, no dullness to percussion,bibasal  rales ,no rhonchi  Cardiovascular: Rhythm regular, heart sounds  normal, no murmurs or gallops, 2+ peripheral edema Abdomen: soft and non-tender, no hepatosplenomegaly, BS normal. Musculoskeletal: No deformities, no cyanosis or clubbing Neuro:  alert, non focal       Assessment & Plan:

## 2013-04-11 NOTE — Assessment & Plan Note (Signed)
Check for pulse O2  Lasix x 10 ds May need echo to FU murmur ? AS

## 2013-04-11 NOTE — Assessment & Plan Note (Signed)
Start on prednisone 5mg  daily -longer course given response in the past (cough improves) Take pepcid at bedtime daily Ok to stay on cough meds NOT a candidate for perfenidone

## 2013-05-01 ENCOUNTER — Other Ambulatory Visit: Payer: Self-pay | Admitting: Dermatology

## 2013-05-15 ENCOUNTER — Ambulatory Visit (INDEPENDENT_AMBULATORY_CARE_PROVIDER_SITE_OTHER)
Admission: RE | Admit: 2013-05-15 | Discharge: 2013-05-15 | Disposition: A | Discharge status: Home / Self Care | Payer: Medicare PPO | Source: Ambulatory Visit | Attending: Adult Health | Admitting: Adult Health

## 2013-05-15 ENCOUNTER — Encounter: Payer: Self-pay | Admitting: Adult Health

## 2013-05-15 ENCOUNTER — Ambulatory Visit (INDEPENDENT_AMBULATORY_CARE_PROVIDER_SITE_OTHER): Payer: Medicare PPO | Admitting: Adult Health

## 2013-05-15 VITALS — BP 140/88 | HR 67 | Temp 98.3°F | Ht 60.0 in | Wt 122.0 lb

## 2013-05-15 DIAGNOSIS — J8410 Pulmonary fibrosis, unspecified: Secondary | ICD-10-CM

## 2013-05-15 NOTE — Progress Notes (Signed)
   Subjective:    Patient ID: Erica Hansen, female    DOB: 10/30/1922, 79 y.o.   MRN: [id number]  HPI  [name], never smoker for FU of pulmonary fibrosis,  She was treated with empiric steroids for cough since 2/10 with good response  She did not qualify for the ASCEND Trial due to age limit.  PFTs 7/09 >>FVC 59% - mod restriction with desaturation on exertion.  ANA neg, RA factor neg - esr 71  Pneumovax 2003  11/09 CT chest >>Pulmonary parenchymal pattern of fibrosis is most consistent with usual interstitial pneumonia (UIP). Increase in superimposed ground-glass suggests areas of active inflammation.     Chief Complaint  Patient presents with  . Follow-up    Breathing is unchanged since last OV.  SATS at 45 when arriving up to 97 at 3LP   She was qualified for 02 but has resisted it -finally started on O2 on last visit 03/2013  She is frustrated but compliant, portable O2 only lasts 2h - she cancelled a trip due to this reason - 03/19/2013 cxr suggest worse alveolitis > Given short course of pred, Also started on pepcid for GERD C/o increased bipedal edema Increased cough on round the clock tessalon    05/15/2013 Follow up  Breathing is unchanged since last OV.  SATS at 87 when arriving up to 97 at 3LP.  Walkiing with 4 l/m continues sats >90%. AT rest in chair sats >90 on RA.  Cough is better since starting on prednisone 5mg  daily  Feels DOE about the same no different.  No chest pain , orthopnea, edema or fever.  Was given lasix last ov with some improvement in LE edema  Seems LE edema better in am and more in evening. No orthopnea.     Past Medical History  Diagnosis Date  . Diverticulosis   . Unspecified essential hypertension   . Osteoarthritis   . Type II or unspecified type diabetes mellitus without mention of complication, not stated as uncontrolled   . Anxiety states   . Lung fibrosis   . Urinary incontinence      Review of Systems  neg for any  significant sore throat, dysphagia, itching, sneezing, nasal congestion or excess/ purulent secretions, fever, chills, sweats, unintended wt loss, pleuritic or exertional cp, hempoptysis, orthopnea pnd or change in chronic leg swelling. Also denies presyncope, palpitations, heartburn, abdominal pain, nausea, vomiting, diarrhea or change in bowel or urinary habits, dysuria,hematuria, rash, arthralgias, visual complaints, headache, numbness weakness or ataxia.     Objective:   Physical Exam   Gen. Pleasant,  in no distress, normal affect ENT - no lesions, no post nasal drip Neck: No JVD, no thyromegaly, no carotid bruits Lungs: no use of accessory muscles, bibasilar crackles  Cardiovascular: Rhythm regular, heart sounds  normal, 1/6 SM , tr -1 +peripheral edema Abdomen: soft and non-tender, no hepatosplenomegaly, BS normal. Musculoskeletal: No deformities, no cyanosis or clubbing Neuro:  alert, non focal       Assessment & Plan:

## 2013-05-15 NOTE — Assessment & Plan Note (Signed)
Clinically improving w/ improved cough    Plan  Decrease Prednisone 5mg  1/2 tab for 2 weeks and 1/2 tab every other day for 2 weeks then stop  Chest xray today .  Wear oxygen 2 l/m sleeping, and 4 l/m with walking . Can be off Oxygen at rest if sats >90%.  Follow up Dr. Vassie Loll  In 6 weeks  Please contact office for sooner follow up if symptoms do not improve or worsen or seek emergency care

## 2013-05-15 NOTE — Patient Instructions (Signed)
Decrease Prednisone 5mg  1/2 tab for 2 weeks and 1/2 tab every other day for 2 weeks then stop  Chest xray today .  Wear oxygen 2 l/m sleeping, and 4 l/m with walking . Can be off Oxygen at rest if sats >90%.  Follow up Dr. Vassie Loll  In 6 weeks  Please contact office for sooner follow up if symptoms do not improve or worsen or seek emergency care

## 2013-05-22 ENCOUNTER — Encounter: Payer: Self-pay | Admitting: Internal Medicine

## 2013-06-10 ENCOUNTER — Encounter: Payer: Self-pay | Admitting: Internal Medicine

## 2013-06-25 ENCOUNTER — Encounter: Payer: Self-pay | Admitting: Internal Medicine

## 2013-06-28 ENCOUNTER — Encounter: Payer: Self-pay | Admitting: Pulmonary Disease

## 2013-06-28 ENCOUNTER — Ambulatory Visit (INDEPENDENT_AMBULATORY_CARE_PROVIDER_SITE_OTHER): Payer: Medicare PPO | Admitting: Pulmonary Disease

## 2013-06-28 VITALS — BP 118/76 | HR 68 | Temp 98.0°F | Wt 125.0 lb

## 2013-06-28 DIAGNOSIS — J8410 Pulmonary fibrosis, unspecified: Secondary | ICD-10-CM

## 2013-06-28 DIAGNOSIS — J9610 Chronic respiratory failure, unspecified whether with hypoxia or hypercapnia: Secondary | ICD-10-CM

## 2013-06-28 MED ORDER — BENZONATATE 100 MG PO CAPS: 200.0000 mg | ORAL_CAPSULE | Freq: Three times a day (TID) | ORAL | Status: DC | PRN

## 2013-06-28 MED ORDER — FAMOTIDINE 20 MG PO TABS: ORAL_TABLET | ORAL | Status: DC

## 2013-06-28 NOTE — Assessment & Plan Note (Signed)
We discussed using oxygen -precautions to ensure she does not trip on cord, high fall risk

## 2013-06-28 NOTE — Assessment & Plan Note (Signed)
Refills on tessalon & pepcid Call if cough worsens & we can call in prednisone DNR confirmed with daughter Nicholos Johnskathleen

## 2013-06-28 NOTE — Progress Notes (Signed)
   Subjective:    Patient ID: Erica Hansen, female    DOB: 12/16/1922, 80 y.o.   MRN: [id number]  HPI  [name], never smoker for FU of IPF  She was treated with empiric steroids for cough since 2/10 with good response   PFTs 7/09 >>FVC 59% - mod restriction with desaturation on exertion.  ANA neg, RA factor neg - esr 71  Pneumovax 2003  11/09 CT chest >> pattern of fibrosis is most consistent with usual interstitial pneumonia (UIP). Increase in superimposed ground-glass suggests areas of active  inflammation.   She was qualified for 02 but has resisted it -finally started on O2  03/2013 -She is frustrated but compliant - 03/19/2013 cxr suggest worse alveolitis > Given short course of pred & pepcid for GERD   06/28/2013  Chief Complaint  Patient presents with  . Follow-up    pt reports she as been okay. Pt started having a non prod cough x couple days.    2m FU  -on round the clock tessalon   Breathing is unchanged since last OV.  Walkiing with 4 l/m continues sats >90%. AT rest in chair sats >90 on RA.  Cough is better off prednisone  Feels DOE about the same no different.  No chest pain , orthopnea, edema or fever.    Past Medical History  Diagnosis Date  . Diverticulosis   . Unspecified essential hypertension   . Osteoarthritis   . Type II or unspecified type diabetes mellitus without mention of complication, not stated as uncontrolled   . Anxiety states   . Lung fibrosis   . Urinary incontinence      Review of Systems  neg for any significant sore throat, dysphagia, itching, sneezing, nasal congestion or excess/ purulent secretions, fever, chills, sweats, unintended wt loss, pleuritic or exertional cp, hempoptysis, orthopnea pnd or change in chronic leg swelling. Also denies presyncope, palpitations, heartburn, abdominal pain, nausea, vomiting, diarrhea or change in bowel or urinary habits, dysuria,hematuria, rash, arthralgias, visual complaints, headache, numbness  weakness or ataxia.     Objective:   Physical Exam  Gen. Pleasant, well-nourished, in no distress, normal affect ENT - no lesions, no post nasal drip Neck: No JVD, no thyromegaly, no carotid bruits Lungs: no use of accessory muscles, no dullness to percussion, bibasal rales , no rhonchi  Cardiovascular: Rhythm regular, heart sounds  normal, no murmurs or gallops, 1+ peripheral edema Abdomen: soft and non-tender, no hepatosplenomegaly, BS normal. Musculoskeletal: No deformities, no cyanosis or clubbing Neuro:  alert, non focal       Assessment & Plan:   

## 2013-06-28 NOTE — Patient Instructions (Signed)
Refills on tessalon & pepcid We discussed using oxygen Call if cough worsens & we can call in prednisone

## 2013-07-29 ENCOUNTER — Telehealth: Payer: Self-pay | Admitting: Pulmonary Disease

## 2013-07-29 ENCOUNTER — Other Ambulatory Visit: Payer: Self-pay | Admitting: *Deleted

## 2013-07-29 ENCOUNTER — Encounter: Payer: Self-pay | Admitting: Internal Medicine

## 2013-07-29 MED ORDER — PREDNISONE 5 MG PO TABS: ORAL_TABLET | ORAL | Status: DC

## 2013-07-29 MED ORDER — FAMOTIDINE 20 MG PO TABS: ORAL_TABLET | ORAL | Status: DC

## 2013-07-29 NOTE — Telephone Encounter (Signed)
Pred10 mg daily x 5 ds then 5 mg daily # 30

## 2013-07-29 NOTE — Telephone Encounter (Signed)
Pt aware of recs.  Nothing further needed. 

## 2013-07-29 NOTE — Telephone Encounter (Signed)
Per OV 06/28/13: Patient Instructions      Refills on tessalon & pepcid We discussed using oxygen Call if cough worsens & we can call in prednisone  --  Spoke with pt. She reports her cough is getting worse. Today she coughed up yellow phlem. She has been taking otc cough syrup and using her tess pearls about BID. She is still on her O2 daily. She wants to know if Dr. Vassie LollAlva wants to call in prednisone. Please advise thanks  Allergies  Allergen Reactions  . Clindamycin     REACTION: diffuse maculopapular rash  . Delsym [Dextromethorphan]     Diarrhea  . Lisinopril     REACTION: cough  . Penicillins   . Sulfonamide Derivatives

## 2013-08-02 ENCOUNTER — Encounter: Payer: Self-pay | Admitting: Gastroenterology

## 2013-09-06 ENCOUNTER — Other Ambulatory Visit: Payer: Self-pay | Admitting: Internal Medicine

## 2013-09-09 ENCOUNTER — Other Ambulatory Visit: Payer: Self-pay | Admitting: Pulmonary Disease

## 2013-09-30 ENCOUNTER — Encounter: Payer: Self-pay | Admitting: Internal Medicine

## 2013-10-03 ENCOUNTER — Ambulatory Visit (INDEPENDENT_AMBULATORY_CARE_PROVIDER_SITE_OTHER): Payer: Medicare PPO | Admitting: Internal Medicine

## 2013-10-03 ENCOUNTER — Encounter: Payer: Self-pay | Admitting: Internal Medicine

## 2013-10-03 VITALS — BP 138/60 | HR 73 | Temp 98.0°F | Wt 123.0 lb

## 2013-10-03 DIAGNOSIS — N183 Chronic kidney disease, stage 3 unspecified: Secondary | ICD-10-CM

## 2013-10-03 DIAGNOSIS — E1129 Type 2 diabetes mellitus with other diabetic kidney complication: Secondary | ICD-10-CM

## 2013-10-03 DIAGNOSIS — J9610 Chronic respiratory failure, unspecified whether with hypoxia or hypercapnia: Secondary | ICD-10-CM

## 2013-10-03 DIAGNOSIS — Z23 Encounter for immunization: Secondary | ICD-10-CM

## 2013-10-03 DIAGNOSIS — I10 Essential (primary) hypertension: Secondary | ICD-10-CM

## 2013-10-03 DIAGNOSIS — M1990 Unspecified osteoarthritis, unspecified site: Secondary | ICD-10-CM

## 2013-10-03 MED ORDER — FAMOTIDINE 20 MG PO TABS: 20.0000 mg | ORAL_TABLET | Freq: Every day | ORAL | Status: DC

## 2013-10-03 MED ORDER — OLMESARTAN MEDOXOMIL-HCTZ 40-12.5 MG PO TABS: 1.0000 | ORAL_TABLET | Freq: Every day | ORAL | Status: DC

## 2013-10-03 MED ORDER — NADOLOL 80 MG PO TABS: 80.0000 mg | ORAL_TABLET | Freq: Every day | ORAL | Status: DC

## 2013-10-03 NOTE — Progress Notes (Signed)
Pre visit review using our clinic review tool, if applicable. No additional management support is needed unless otherwise documented below in the visit note. 

## 2013-10-03 NOTE — Addendum Note (Signed)
Addended by: Sueanne MargaritaSMITH, DESHANNON L on: 10/03/2013 04:35 PM   Modules accepted: Orders

## 2013-10-03 NOTE — Assessment & Plan Note (Signed)
BP Readings from Last 3 Encounters:  10/03/13 138/60  06/28/13 118/76  05/15/13 140/88   Good control

## 2013-10-03 NOTE — Assessment & Plan Note (Signed)
Seems to have good control Due for labs 

## 2013-10-03 NOTE — Assessment & Plan Note (Signed)
Gets relief from the tylenol

## 2013-10-03 NOTE — Assessment & Plan Note (Signed)
On ARB Will recheck labs 

## 2013-10-03 NOTE — Assessment & Plan Note (Signed)
Needs to work on muscle strengthening to prevent further disability

## 2013-10-03 NOTE — Progress Notes (Signed)
Subjective:    Patient ID: Erica Hansen, female    DOB: 1922/05/31, 80 y.o.   MRN: [id number]  HPI Here with daughter  Doing "okay" Has to sit down due to easy dyspnea-- but able to bathe herself Daughter does most of instrumental ADLs and has cleaning woman every 2 weeks Still does her own laundry--it is on her level  No chest pain--except occ brief right sided pain No palpitations No syncope-- will have brief orthostatic dizziness if she gets up too quick  Checks sugars several times a month Usually under 120-- rarely to 140-150 No foot pain No hypoglycemic spells  Having lots of knee pain Uses tylenol arthritis with some relief  Current Outpatient Prescriptions on File Prior to Visit  Medication Sig Dispense Refill  . Acetaminophen (TYLENOL ARTHRITIS PAIN PO) Take 1 tablet by mouth 2 (two) times daily as needed.      Marland Kitchen. aspirin 81 MG tablet Take 81 mg by mouth daily.        Marland Kitchen. diltiazem (CARDIZEM CD) 120 MG 24 hr capsule Take 1 capsule (120 mg total) by mouth daily.  90 capsule  3  . glucose blood (TRUETEST TEST) test strip Use to test blood sugar once daily dx: 250.00  50 each  1  . mirtazapine (REMERON) 15 MG tablet Take 1 tablet (15 mg total) by mouth at bedtime.  90 tablet  3  . Multiple Vitamins-Minerals (CENTRUM SILVER PO) Take 1 capsule by mouth daily.        Bertram Gala. Polyethyl Glycol-Propyl Glycol (SYSTANE) 0.4-0.3 % SOLN Place 2 drops into both eyes at bedtime.        No current facility-administered medications on file prior to visit.    Allergies  Allergen Reactions  . Clindamycin     REACTION: diffuse maculopapular rash  . Delsym [Dextromethorphan]     Diarrhea  . Lisinopril     REACTION: cough  . Penicillins   . Sulfonamide Derivatives     Past Medical History  Diagnosis Date  . Diverticulosis   . Unspecified essential hypertension   . Osteoarthritis   . Type II or unspecified type diabetes mellitus without mention of complication, not stated as  uncontrolled   . Anxiety states   . Lung fibrosis   . Urinary incontinence     Past Surgical History  Procedure Laterality Date  . Abdominal hysterectomy    . Replacement total knee    . Laminectomy    . Humerus fracture surgery      Family History  Problem Relation Age of Onset  . Colon cancer Father     History   Social History  . Marital Status: Widowed    Spouse Name: N/A    Number of Children: 3  . Years of Education: N/A   Occupational History  .     Social History Main Topics  . Smoking status: Never Smoker   . Smokeless tobacco: Never Used  . Alcohol Use: Yes     Comment: occasional  . Drug Use: Not on file  . Sexual Activity: Not on file   Other Topics Concern  . Not on file   Social History Narrative   lives with daughter [name] Has 2 sons in [place]   Has living will   Daughter is health care POA   Discussed DNR--will accept attempts but no prolonged artificial ventilation   No feeding tube if cognitively unaware   Review of Systems Has had some vaginal itching---may  be related to protective undergarments for incontinence. No discharge. Discussed using protective ointment Appetite is not good---weight down 2#. Discussed snacks, carnation instant breakfast, etc    Objective:   Physical Exam  Constitutional: She appears well-developed. No distress.  Neck: Normal range of motion. Neck supple.  Cardiovascular: Normal rate, regular rhythm and intact distal pulses.  Exam reveals no gallop.   Pulmonary/Chest: Effort normal and breath sounds normal. No respiratory distress. She has no wheezes. She has no rales.  Musculoskeletal: She exhibits no edema and no tenderness.  Lymphadenopathy:    She has no cervical adenopathy.  Skin:  No foot lesions  Psychiatric: She has a normal mood and affect. Her behavior is normal.          Assessment & Plan:

## 2013-10-04 ENCOUNTER — Telehealth: Payer: Self-pay | Admitting: Internal Medicine

## 2013-10-04 ENCOUNTER — Encounter: Payer: Self-pay | Admitting: Internal Medicine

## 2013-10-04 LAB — CBC WITH DIFFERENTIAL/PLATELET
Basophils Absolute: 0.1 10*3/uL (ref 0.0–0.1)
Basophils Relative: 0.4 % (ref 0.0–3.0)
Eosinophils Absolute: 0.4 10*3/uL (ref 0.0–0.7)
Eosinophils Relative: 2.4 % (ref 0.0–5.0)
HEMATOCRIT: 38.3 % (ref 36.0–46.0)
HEMOGLOBIN: 12.6 g/dL (ref 12.0–15.0)
LYMPHS ABS: 1.9 10*3/uL (ref 0.7–4.0)
LYMPHS PCT: 11.9 % — AB (ref 12.0–46.0)
MCHC: 32.8 g/dL (ref 30.0–36.0)
MCV: 94.4 fl (ref 78.0–100.0)
MONOS PCT: 4.8 % (ref 3.0–12.0)
Monocytes Absolute: 0.8 10*3/uL (ref 0.1–1.0)
NEUTROS ABS: 13.1 10*3/uL — AB (ref 1.4–7.7)
Neutrophils Relative %: 80.5 % — ABNORMAL HIGH (ref 43.0–77.0)
Platelets: 291 10*3/uL (ref 150.0–400.0)
RBC: 4.06 Mil/uL (ref 3.87–5.11)
RDW: 13.2 % (ref 11.5–15.5)
WBC: 16.3 10*3/uL — AB (ref 4.0–10.5)

## 2013-10-04 LAB — COMPREHENSIVE METABOLIC PANEL
ALT: 18 U/L (ref 0–35)
AST: 22 U/L (ref 0–37)
Albumin: 3.5 g/dL (ref 3.5–5.2)
Alkaline Phosphatase: 64 U/L (ref 39–117)
BILIRUBIN TOTAL: 0.5 mg/dL (ref 0.2–1.2)
BUN: 33 mg/dL — ABNORMAL HIGH (ref 6–23)
CO2: 29 meq/L (ref 19–32)
CREATININE: 1.2 mg/dL (ref 0.4–1.2)
Calcium: 9.6 mg/dL (ref 8.4–10.5)
Chloride: 100 mEq/L (ref 96–112)
GFR: 43.29 mL/min — AB (ref >60.00)
GLUCOSE: 240 mg/dL — AB (ref 70–99)
Potassium: 4.4 mEq/L (ref 3.5–5.1)
Sodium: 137 mEq/L (ref 135–145)
Total Protein: 7.5 g/dL (ref 6.0–8.3)

## 2013-10-04 LAB — LIPID PANEL
Cholesterol: 157 mg/dL (ref 0–200)
HDL: 30.5 mg/dL — ABNORMAL LOW (ref >39.00)
LDL Cholesterol: 89 mg/dL (ref 0–99)
TRIGLYCERIDES: 188 mg/dL — AB (ref 0.0–149.0)
Total CHOL/HDL Ratio: 5
VLDL: 37.6 mg/dL (ref 0.0–40.0)

## 2013-10-04 LAB — T4, FREE: Free T4: 0.93 ng/dL (ref 0.60–1.60)

## 2013-10-04 LAB — TSH: TSH: 1.93 u[IU]/mL (ref 0.35–4.50)

## 2013-10-04 LAB — HEMOGLOBIN A1C: HEMOGLOBIN A1C: 7.1 % — AB (ref 4.6–6.5)

## 2013-10-04 NOTE — Telephone Encounter (Signed)
Relevant patient education mailed to patient.  

## 2013-10-05 ENCOUNTER — Encounter (HOSPITAL_COMMUNITY): Payer: Self-pay | Admitting: Emergency Medicine

## 2013-10-05 ENCOUNTER — Emergency Department (INDEPENDENT_AMBULATORY_CARE_PROVIDER_SITE_OTHER)
Admission: EM | Admit: 2013-10-05 | Discharge: 2013-10-05 | Disposition: A | Discharge status: Other Acute Inpatient Hospital | Payer: Medicare PPO | Source: Home / Self Care

## 2013-10-05 ENCOUNTER — Emergency Department (INDEPENDENT_AMBULATORY_CARE_PROVIDER_SITE_OTHER): Payer: Medicare PPO

## 2013-10-05 ENCOUNTER — Inpatient Hospital Stay (HOSPITAL_COMMUNITY)
Admission: EM | Admit: 2013-10-05 | Discharge: 2013-10-09 | DRG: 871 | Disposition: A | Discharge status: Home Health | Payer: Medicare PPO | Attending: Internal Medicine | Admitting: Internal Medicine

## 2013-10-05 DIAGNOSIS — Z888 Allergy status to other drugs, medicaments and biological substances status: Secondary | ICD-10-CM

## 2013-10-05 DIAGNOSIS — J9620 Acute and chronic respiratory failure, unspecified whether with hypoxia or hypercapnia: Secondary | ICD-10-CM | POA: Diagnosis present

## 2013-10-05 DIAGNOSIS — R011 Cardiac murmur, unspecified: Secondary | ICD-10-CM

## 2013-10-05 DIAGNOSIS — N183 Chronic kidney disease, stage 3 unspecified: Secondary | ICD-10-CM | POA: Diagnosis present

## 2013-10-05 DIAGNOSIS — F411 Generalized anxiety disorder: Secondary | ICD-10-CM

## 2013-10-05 DIAGNOSIS — M1990 Unspecified osteoarthritis, unspecified site: Secondary | ICD-10-CM | POA: Diagnosis present

## 2013-10-05 DIAGNOSIS — A419 Sepsis, unspecified organism: Principal | ICD-10-CM | POA: Diagnosis present

## 2013-10-05 DIAGNOSIS — Z66 Do not resuscitate: Secondary | ICD-10-CM | POA: Diagnosis present

## 2013-10-05 DIAGNOSIS — I129 Hypertensive chronic kidney disease with stage 1 through stage 4 chronic kidney disease, or unspecified chronic kidney disease: Secondary | ICD-10-CM | POA: Diagnosis present

## 2013-10-05 DIAGNOSIS — K5730 Diverticulosis of large intestine without perforation or abscess without bleeding: Secondary | ICD-10-CM

## 2013-10-05 DIAGNOSIS — M48061 Spinal stenosis, lumbar region without neurogenic claudication: Secondary | ICD-10-CM

## 2013-10-05 DIAGNOSIS — Z7982 Long term (current) use of aspirin: Secondary | ICD-10-CM

## 2013-10-05 DIAGNOSIS — J189 Pneumonia, unspecified organism: Secondary | ICD-10-CM | POA: Diagnosis present

## 2013-10-05 DIAGNOSIS — Z96659 Presence of unspecified artificial knee joint: Secondary | ICD-10-CM

## 2013-10-05 DIAGNOSIS — R6520 Severe sepsis without septic shock: Secondary | ICD-10-CM

## 2013-10-05 DIAGNOSIS — I10 Essential (primary) hypertension: Secondary | ICD-10-CM

## 2013-10-05 DIAGNOSIS — Z880 Allergy status to penicillin: Secondary | ICD-10-CM

## 2013-10-05 DIAGNOSIS — Z79899 Other long term (current) drug therapy: Secondary | ICD-10-CM

## 2013-10-05 DIAGNOSIS — J8410 Pulmonary fibrosis, unspecified: Secondary | ICD-10-CM | POA: Diagnosis present

## 2013-10-05 DIAGNOSIS — J9610 Chronic respiratory failure, unspecified whether with hypoxia or hypercapnia: Secondary | ICD-10-CM

## 2013-10-05 DIAGNOSIS — R0902 Hypoxemia: Secondary | ICD-10-CM

## 2013-10-05 DIAGNOSIS — R200 Anesthesia of skin: Secondary | ICD-10-CM

## 2013-10-05 DIAGNOSIS — E1129 Type 2 diabetes mellitus with other diabetic kidney complication: Secondary | ICD-10-CM | POA: Diagnosis present

## 2013-10-05 DIAGNOSIS — Z9981 Dependence on supplemental oxygen: Secondary | ICD-10-CM

## 2013-10-05 DIAGNOSIS — I2781 Cor pulmonale (chronic): Secondary | ICD-10-CM

## 2013-10-05 DIAGNOSIS — N179 Acute kidney failure, unspecified: Secondary | ICD-10-CM | POA: Diagnosis present

## 2013-10-05 DIAGNOSIS — I279 Pulmonary heart disease, unspecified: Secondary | ICD-10-CM | POA: Diagnosis present

## 2013-10-05 DIAGNOSIS — D72829 Elevated white blood cell count, unspecified: Secondary | ICD-10-CM

## 2013-10-05 DIAGNOSIS — J9621 Acute and chronic respiratory failure with hypoxia: Secondary | ICD-10-CM

## 2013-10-05 DIAGNOSIS — R32 Unspecified urinary incontinence: Secondary | ICD-10-CM | POA: Diagnosis present

## 2013-10-05 LAB — URINALYSIS, ROUTINE W REFLEX MICROSCOPIC
Bilirubin Urine: NEGATIVE
Glucose, UA: NEGATIVE mg/dL
Ketones, ur: NEGATIVE mg/dL
Nitrite: NEGATIVE
Protein, ur: 100 mg/dL — AB
Specific Gravity, Urine: 1.025 (ref 1.005–1.030)
Urobilinogen, UA: 0.2 mg/dL (ref 0.0–1.0)
pH: 5 (ref 5.0–8.0)

## 2013-10-05 LAB — BASIC METABOLIC PANEL
BUN: 32 mg/dL — ABNORMAL HIGH (ref 6–23)
CO2: 27 mEq/L (ref 19–32)
Calcium: 9 mg/dL (ref 8.4–10.5)
Chloride: 96 mEq/L (ref 96–112)
Creatinine, Ser: 1.06 mg/dL (ref 0.50–1.10)
GFR calc Af Amer: 52 mL/min — ABNORMAL LOW (ref >90)
GFR calc non Af Amer: 45 mL/min — ABNORMAL LOW (ref >90)
Glucose, Bld: 210 mg/dL — ABNORMAL HIGH (ref 70–99)
Potassium: 4.2 mEq/L (ref 3.7–5.3)
Sodium: 137 mEq/L (ref 137–147)

## 2013-10-05 LAB — URINE MICROSCOPIC-ADD ON

## 2013-10-05 LAB — CBC WITH DIFFERENTIAL/PLATELET
Basophils Absolute: 0 10*3/uL (ref 0.0–0.1)
Basophils Relative: 0 % (ref 0–1)
Eosinophils Absolute: 0 10*3/uL (ref 0.0–0.7)
Eosinophils Relative: 0 % (ref 0–5)
HCT: 33.4 % — ABNORMAL LOW (ref 36.0–46.0)
Hemoglobin: 11.2 g/dL — ABNORMAL LOW (ref 12.0–15.0)
Lymphocytes Relative: 9 % — ABNORMAL LOW (ref 12–46)
Lymphs Abs: 1.7 10*3/uL (ref 0.7–4.0)
MCH: 31.1 pg (ref 26.0–34.0)
MCHC: 33.5 g/dL (ref 30.0–36.0)
MCV: 92.8 fL (ref 78.0–100.0)
Monocytes Absolute: 1.3 10*3/uL — ABNORMAL HIGH (ref 0.1–1.0)
Monocytes Relative: 7 % (ref 3–12)
Neutro Abs: 15.9 10*3/uL — ABNORMAL HIGH (ref 1.7–7.7)
Neutrophils Relative %: 84 % — ABNORMAL HIGH (ref 43–77)
Platelets: 242 10*3/uL (ref 150–400)
RBC: 3.6 MIL/uL — ABNORMAL LOW (ref 3.87–5.11)
RDW: 13.5 % (ref 11.5–15.5)
WBC: 18.9 10*3/uL — ABNORMAL HIGH (ref 4.0–10.5)

## 2013-10-05 LAB — LACTIC ACID, PLASMA: Lactic Acid, Venous: 1.3 mmol/L (ref 0.5–2.2)

## 2013-10-05 LAB — CBG MONITORING, ED: Glucose-Capillary: 218 mg/dL — ABNORMAL HIGH (ref 70–99)

## 2013-10-05 LAB — GLUCOSE, CAPILLARY: Glucose-Capillary: 134 mg/dL — ABNORMAL HIGH (ref 70–99)

## 2013-10-05 MED ORDER — PROSIGHT PO TABS
1.0000 | ORAL_TABLET | Freq: Every morning | ORAL | Status: DC
  Administered 2013-10-06 – 2013-10-08 (×3): 1 via ORAL
  Filled 2013-10-05 (×4): qty 1

## 2013-10-05 MED ORDER — POLYVINYL ALCOHOL 1.4 % OP SOLN
2.0000 [drp] | Freq: Every day | OPHTHALMIC | Status: DC
  Administered 2013-10-05 – 2013-10-08 (×4): 2 [drp] via OPHTHALMIC
  Filled 2013-10-05: qty 15

## 2013-10-05 MED ORDER — LEVOFLOXACIN IN D5W 500 MG/100ML IV SOLN
500.0000 mg | INTRAVENOUS | Status: DC
Start: 2013-10-06 — End: 2013-10-06
  Filled 2013-10-05: qty 100

## 2013-10-05 MED ORDER — ALBUTEROL SULFATE (2.5 MG/3ML) 0.083% IN NEBU
5.0000 mg | INHALATION_SOLUTION | Freq: Once | RESPIRATORY_TRACT | Status: AC
  Administered 2013-10-05: 5 mg via RESPIRATORY_TRACT
  Filled 2013-10-05: qty 6

## 2013-10-05 MED ORDER — ALUM & MAG HYDROXIDE-SIMETH 200-200-20 MG/5ML PO SUSP: 30.0000 mL | Freq: Four times a day (QID) | ORAL | Status: DC | PRN

## 2013-10-05 MED ORDER — HYDROMORPHONE HCL PF 1 MG/ML IJ SOLN: 0.5000 mg | INTRAMUSCULAR | Status: DC | PRN

## 2013-10-05 MED ORDER — DILTIAZEM HCL ER COATED BEADS 120 MG PO CP24
120.0000 mg | ORAL_CAPSULE | Freq: Every day | ORAL | Status: DC
  Administered 2013-10-06 – 2013-10-09 (×4): 120 mg via ORAL
  Filled 2013-10-05 (×4): qty 1

## 2013-10-05 MED ORDER — LEVOFLOXACIN IN D5W 500 MG/100ML IV SOLN
500.0000 mg | Freq: Once | INTRAVENOUS | Status: AC
  Administered 2013-10-05: 500 mg via INTRAVENOUS
  Filled 2013-10-05: qty 100

## 2013-10-05 MED ORDER — NADOLOL 80 MG PO TABS
80.0000 mg | ORAL_TABLET | Freq: Every day | ORAL | Status: DC
  Administered 2013-10-05 – 2013-10-08 (×4): 80 mg via ORAL
  Filled 2013-10-05 (×5): qty 1

## 2013-10-05 MED ORDER — SODIUM CHLORIDE 0.9 % IV BOLUS (SEPSIS): 500.0000 mL | Freq: Once | INTRAVENOUS | Status: DC

## 2013-10-05 MED ORDER — INSULIN ASPART 100 UNIT/ML ~~LOC~~ SOLN
0.0000 [IU] | Freq: Three times a day (TID) | SUBCUTANEOUS | Status: DC
  Administered 2013-10-06: 1 [IU] via SUBCUTANEOUS
  Administered 2013-10-07 – 2013-10-08 (×3): 2 [IU] via SUBCUTANEOUS
  Administered 2013-10-08 (×2): 3 [IU] via SUBCUTANEOUS
  Administered 2013-10-09: 1 [IU] via SUBCUTANEOUS

## 2013-10-05 MED ORDER — INSULIN ASPART 100 UNIT/ML ~~LOC~~ SOLN
0.0000 [IU] | Freq: Every day | SUBCUTANEOUS | Status: DC
Start: 2013-10-05 — End: 2013-10-09

## 2013-10-05 MED ORDER — SODIUM CHLORIDE 0.9 % IV SOLN
Freq: Once | INTRAVENOUS | Status: AC
  Administered 2013-10-05: 17:00:00 via INTRAVENOUS

## 2013-10-05 MED ORDER — ALBUTEROL SULFATE (2.5 MG/3ML) 0.083% IN NEBU: 2.5000 mg | INHALATION_SOLUTION | RESPIRATORY_TRACT | Status: DC | PRN

## 2013-10-05 MED ORDER — ONDANSETRON HCL 4 MG PO TABS: 4.0000 mg | ORAL_TABLET | Freq: Four times a day (QID) | ORAL | Status: DC | PRN

## 2013-10-05 MED ORDER — ACETAMINOPHEN 325 MG PO TABS: 650.0000 mg | ORAL_TABLET | Freq: Four times a day (QID) | ORAL | Status: DC | PRN

## 2013-10-05 MED ORDER — OXYCODONE HCL 5 MG PO TABS: 5.0000 mg | ORAL_TABLET | ORAL | Status: DC | PRN

## 2013-10-05 MED ORDER — ONDANSETRON HCL 4 MG/2ML IJ SOLN
4.0000 mg | Freq: Once | INTRAMUSCULAR | Status: AC
  Administered 2013-10-05: 4 mg via INTRAVENOUS
  Filled 2013-10-05: qty 2

## 2013-10-05 MED ORDER — ENOXAPARIN SODIUM 30 MG/0.3ML ~~LOC~~ SOLN
30.0000 mg | SUBCUTANEOUS | Status: DC
  Administered 2013-10-05 – 2013-10-08 (×4): 30 mg via SUBCUTANEOUS
  Filled 2013-10-05 (×5): qty 0.3

## 2013-10-05 MED ORDER — ONDANSETRON HCL 4 MG/2ML IJ SOLN
4.0000 mg | Freq: Four times a day (QID) | INTRAMUSCULAR | Status: DC | PRN
Start: 2013-10-05 — End: 2013-10-09

## 2013-10-05 MED ORDER — SODIUM CHLORIDE 0.9 % IV SOLN
INTRAVENOUS | Status: DC
  Administered 2013-10-05: 22:00:00 via INTRAVENOUS

## 2013-10-05 MED ORDER — MIRTAZAPINE 15 MG PO TABS
15.0000 mg | ORAL_TABLET | Freq: Every day | ORAL | Status: DC
  Administered 2013-10-05 – 2013-10-08 (×4): 15 mg via ORAL
  Filled 2013-10-05 (×5): qty 1

## 2013-10-05 MED ORDER — ALBUTEROL SULFATE (2.5 MG/3ML) 0.083% IN NEBU
2.5000 mg | INHALATION_SOLUTION | Freq: Four times a day (QID) | RESPIRATORY_TRACT | Status: DC
  Administered 2013-10-05 – 2013-10-07 (×8): 2.5 mg via RESPIRATORY_TRACT
  Filled 2013-10-05 (×8): qty 3

## 2013-10-05 MED ORDER — ACETAMINOPHEN 650 MG RE SUPP: 650.0000 mg | Freq: Four times a day (QID) | RECTAL | Status: DC | PRN

## 2013-10-05 MED ORDER — FAMOTIDINE 20 MG PO TABS
20.0000 mg | ORAL_TABLET | Freq: Every day | ORAL | Status: DC
  Administered 2013-10-05 – 2013-10-08 (×4): 20 mg via ORAL
  Filled 2013-10-05 (×6): qty 1

## 2013-10-05 NOTE — ED Notes (Signed)
Pt placed on supplemental O2 @ 2 Lpm via [place].  spO2 = 87%

## 2013-10-05 NOTE — ED Notes (Signed)
Went to [facility] for SOB; sats in 80's on room air; improved to 90's on 3L [place]; CXR showed PNA, sent for further workup. Denies pain or shortness of breath at this time. Reports throat feels sore.

## 2013-10-05 NOTE — ED Notes (Signed)
Patient up to bedside commode with 2L [place] uninterrupted; sats fell to 73% with activity. Returned to bed and increased O2 to 3L and she recovered to 92%.

## 2013-10-05 NOTE — ED Provider Notes (Addendum)
CSN: [id number]     Arrival date & time 10/05/13  1614 History   None    Chief Complaint  Patient presents with  . URI  . GI Problem   (Consider location/radiation/quality/duration/timing/severity/associated sxs/prior Treatment) Patient is a 80 y.o. female presenting with shortness of breath. The history is provided by the patient and a relative.  Shortness of Breath Severity:  Moderate Onset quality:  Gradual Duration:  2 days Progression:  Worsening Chronicity:  New Context comment:  Given pneumonia shot on thurs at [facility], felt fine , then on fri n/v sob , continues today. Associated symptoms: cough and vomiting   Associated symptoms: no abdominal pain, no chest pain, no fever and no wheezing     Past Medical History  Diagnosis Date  . Diverticulosis   . Unspecified essential hypertension   . Osteoarthritis   . Type II or unspecified type diabetes mellitus without mention of complication, not stated as uncontrolled   . Anxiety states   . Lung fibrosis   . Urinary incontinence    Past Surgical History  Procedure Laterality Date  . Abdominal hysterectomy    . Replacement total knee    . Laminectomy    . Humerus fracture surgery     Family History  Problem Relation Age of Onset  . Colon cancer Father    History  Substance Use Topics  . Smoking status: Never Smoker   . Smokeless tobacco: Never Used  . Alcohol Use: Yes     Comment: occasional   OB History   Grav Para Term Preterm Abortions TAB SAB Ect Mult Living                 Review of Systems  Constitutional: Positive for chills and appetite change. Negative for fever.  HENT: Negative.   Respiratory: Positive for cough and shortness of breath. Negative for wheezing.   Cardiovascular: Positive for leg swelling. Negative for chest pain.  Gastrointestinal: Positive for nausea and vomiting. Negative for abdominal pain and diarrhea.  Genitourinary: Negative.   Musculoskeletal: Negative.   Skin: Negative.      Allergies  Clindamycin; Delsym; Lisinopril; Penicillins; and Sulfonamide derivatives  Home Medications   Prior to Admission medications   Medication Sig Start Date End Date Taking? Authorizing Provider  Acetaminophen (TYLENOL ARTHRITIS PAIN PO) Take 1 tablet by mouth 2 (two) times daily as needed.    Historical Provider, MD  aspirin 81 MG tablet Take 81 mg by mouth daily.      Historical Provider, MD  diltiazem (CARDIZEM CD) 120 MG 24 hr capsule Take 1 capsule (120 mg total) by mouth daily. 09/27/12   Karie Schwalbe, MD  famotidine (PEPCID) 20 MG tablet Take 1 tablet (20 mg total) by mouth at bedtime. 10/03/13   Karie Schwalbe, MD  glucose blood (TRUETEST TEST) test strip Use to test blood sugar once daily dx: 250.00 09/06/13   Karie Schwalbe, MD  mirtazapine (REMERON) 15 MG tablet Take 1 tablet (15 mg total) by mouth at bedtime. 10/10/12   Karie Schwalbe, MD  Multiple Vitamins-Minerals (CENTRUM SILVER PO) Take 1 capsule by mouth daily.      Historical Provider, MD  nadolol (CORGARD) 80 MG tablet Take 1 tablet (80 mg total) by mouth daily. 10/03/13   Karie Schwalbe, MD  olmesartan-hydrochlorothiazide (BENICAR HCT) 40-12.5 MG per tablet Take 1 tablet by mouth daily. 10/03/13   Karie Schwalbe, MD  Polyethyl Glycol-Propyl Glycol (SYSTANE) 0.4-0.3 % SOLN Place 2  drops into both eyes at bedtime.     Historical Provider, MD   BP 152/57  Pulse 100  Temp(Src) 99.9 F (37.7 C) (Oral)  Resp 23  SpO2 85% Physical Exam  Nursing note and vitals reviewed. Constitutional: She is oriented to person, place, and time. Vital signs are normal. She appears well-developed and well-nourished. She is cooperative. She has a sickly appearance. She appears ill. She appears distressed.  Cardiovascular: Regular rhythm and normal pulses.  Tachycardia present.   Murmur heard.  Crescendo systolic murmur is present with a grade of 4/6  Pulmonary/Chest: Tachypnea noted. She has decreased breath sounds. She  has rhonchi. She has rales in the right upper field, the right middle field, the right lower field, the left upper field, the left middle field and the left lower field.  Abdominal: Soft. Bowel sounds are normal. She exhibits no distension and no mass. There is no tenderness. There is no rebound and no guarding.  Musculoskeletal: She exhibits edema.  Neurological: She is alert and oriented to person, place, and time.  Skin: Skin is warm and dry.    ED Course  Procedures (including critical care time) Labs Review Labs Reviewed - No data to display  Imaging Review Dg Chest 2 View  10/05/2013   CLINICAL DATA:  Fever, cough  EXAM: CHEST  2 VIEW  COMPARISON:  05/15/2013  FINDINGS: Cardiomediastinal silhouette is stable. Extensive fibrotic changes and chronic interstitial prominence again noted bilaterally. There is superimposed haziness in left midlung and left lower lobe suspicious for superimposed infiltrate/ pneumonia . Followup examination to assure resolution recommended.  IMPRESSION: Extensive fibrotic changes and chronic interstitial prominence again noted bilaterally. There is superimposed haziness in left midlung and left lower lobe suspicious for superimposed infiltrate/ pneumonia . Followup examination to assure resolution recommended.   Electronically Signed   By: Natasha MeadLiviu  Pop M.D.   On: 10/05/2013 16:54   X-rays reviewed and report per radiologist.   MDM   1. Acute on chronic respiratory failure with hypoxia    Sent for [facility] hosp for worsening sob, chills, worsening cxr - prob pna overlying pulm fibrosis.    Linna HoffJames D Dianne Whelchel, MD 10/05/13 1656  Linna HoffJames D Jama Mcmiller, MD 10/05/13 1700

## 2013-10-05 NOTE — ED Provider Notes (Signed)
CSN: [id number]     Arrival date & time 10/05/13  1731 History   First MD Initiated Contact with Patient 10/05/13 1742     Chief Complaint  Patient presents with  . Shortness of Breath     (Consider location/radiation/quality/duration/timing/severity/associated sxs/prior Treatment) HPI  80 year old female referred from urgent care and accompanied by her daughter for evaluation of generalized weakness, shortness of breath and nausea/vomiting. Symptom onset about a day ago. She went to urgent care because of her symptoms chest x-ray which was consistent with pneumonia. Patient with a history of pulmonary fibrosis. She uses supplemental oxygen only while sleeping. She's had an increase cough for the past 2 days. Some worsening shortness of breath. Has not felt like she's had a fever, but she has had chills. Nausea, vomiting and anorexia. Denies any abdominal pain or pain anywhere else for that matter. Denies urinary symptoms. No sick contacts.  Past Medical History  Diagnosis Date  . Diverticulosis   . Unspecified essential hypertension   . Osteoarthritis   . Type II or unspecified type diabetes mellitus without mention of complication, not stated as uncontrolled   . Anxiety states   . Lung fibrosis   . Urinary incontinence    Past Surgical History  Procedure Laterality Date  . Abdominal hysterectomy    . Replacement total knee    . Laminectomy    . Humerus fracture surgery     Family History  Problem Relation Age of Onset  . Colon cancer Father    History  Substance Use Topics  . Smoking status: Never Smoker   . Smokeless tobacco: Never Used  . Alcohol Use: Yes     Comment: occasional   OB History   Grav Para Term Preterm Abortions TAB SAB Ect Mult Living                 Review of Systems  All systems reviewed and negative, other than as noted in HPI.   Allergies  Clindamycin; Delsym; Lisinopril; Penicillins; and Sulfonamide derivatives  Home Medications   Prior  to Admission medications   Medication Sig Start Date End Date Taking? Authorizing Provider  Acetaminophen (TYLENOL ARTHRITIS PAIN PO) Take 1 tablet by mouth 2 (two) times daily as needed.    Historical Provider, MD  aspirin 81 MG tablet Take 81 mg by mouth daily.      Historical Provider, MD  diltiazem (CARDIZEM CD) 120 MG 24 hr capsule Take 1 capsule (120 mg total) by mouth daily. 09/27/12   Karie Schwalbe, MD  famotidine (PEPCID) 20 MG tablet Take 1 tablet (20 mg total) by mouth at bedtime. 10/03/13   Karie Schwalbe, MD  glucose blood (TRUETEST TEST) test strip Use to test blood sugar once daily dx: 250.00 09/06/13   Karie Schwalbe, MD  mirtazapine (REMERON) 15 MG tablet Take 1 tablet (15 mg total) by mouth at bedtime. 10/10/12   Karie Schwalbe, MD  Multiple Vitamins-Minerals (CENTRUM SILVER PO) Take 1 capsule by mouth daily.      Historical Provider, MD  nadolol (CORGARD) 80 MG tablet Take 1 tablet (80 mg total) by mouth daily. 10/03/13   Karie Schwalbe, MD  olmesartan-hydrochlorothiazide (BENICAR HCT) 40-12.5 MG per tablet Take 1 tablet by mouth daily. 10/03/13   Karie Schwalbe, MD  Polyethyl Glycol-Propyl Glycol (SYSTANE) 0.4-0.3 % SOLN Place 2 drops into both eyes at bedtime.     Historical Provider, MD   BP 149/58  Pulse 94  Temp(Src)  97.8 F (36.6 C) (Oral)  Resp 17  Ht 5' (1.524 m)  Wt 123 lb (55.792 kg)  BMI 24.02 kg/m2  SpO2 97% Physical Exam  Nursing note and vitals reviewed. Constitutional: She is oriented to person, place, and time. She appears well-developed and well-nourished. No distress.  HENT:  Head: Normocephalic and atraumatic.  Dry mucous membranes  Eyes: Conjunctivae are normal. Right eye exhibits no discharge. Left eye exhibits no discharge.  Neck: Neck supple.  Cardiovascular: Normal rate, regular rhythm and normal heart sounds.  Exam reveals no gallop and no friction rub.   No murmur heard. Pulmonary/Chest:  Bilateral rhonchi and faint expiratory  wheezing.  Abdominal: Soft. She exhibits no distension. There is no tenderness.  Musculoskeletal: She exhibits edema. She exhibits no tenderness.  Symmetric lower extremity edema. no calf tenderness. No palpable cords. Negative Homans.  Neurological: She is alert and oriented to person, place, and time. No cranial nerve deficit. She exhibits normal muscle tone. Coordination normal.  Skin: Skin is warm and dry.  Psychiatric: She has a normal mood and affect. Her behavior is normal. Thought content normal.    ED Course  Procedures (including critical care time) Labs Review Labs Reviewed  CBC WITH DIFFERENTIAL  BASIC METABOLIC PANEL  LACTIC ACID, PLASMA  URINALYSIS, ROUTINE W REFLEX MICROSCOPIC    Imaging Review Dg Chest 2 View  10/05/2013   CLINICAL DATA:  Fever, cough  EXAM: CHEST  2 VIEW  COMPARISON:  05/15/2013  FINDINGS: Cardiomediastinal silhouette is stable. Extensive fibrotic changes and chronic interstitial prominence again noted bilaterally. There is superimposed haziness in left midlung and left lower lobe suspicious for superimposed infiltrate/ pneumonia . Followup examination to assure resolution recommended.  IMPRESSION: Extensive fibrotic changes and chronic interstitial prominence again noted bilaterally. There is superimposed haziness in left midlung and left lower lobe suspicious for superimposed infiltrate/ pneumonia . Followup examination to assure resolution recommended.   Electronically Signed   By: Natasha MeadLiviu  Pop M.D.   On: 10/05/2013 16:54     EKG Interpretation   Date/Time:  Saturday Oct 05 2013 17:45:31 EDT Ventricular Rate:  94 PR Interval:  199 QRS Duration: 73 QT Interval:  382 QTC Calculation: 478 R Axis:   15 Text Interpretation:  Sinus rhythm  left atrial enlargement No old tracing  to compare Confirmed by Violette Morneault  MD, Deane Wattenbarger ([id number]) on 10/05/2013 7:40:18 PM      MDM   Final diagnoses:  Sepsis  CAP (community acquired pneumonia)    80 year old  female with increased cough, dyspnea and nausea/vomiting. Chest x-ray is consistent with pneumonia. She has a penicillin allergy. Levaquin order to cover for community-acquired pneumonia. Suspect that her nausea/vomiting is related to a febrile illness. Her abdominal exam is benign. Will check some basic blood work. Urinalysis. Some IV fluids given multiple episodes of vomiting and dry mucous membranes on exam. Zofran. Admission.  Subsequent urinalysis with possible UTI. Will send a urine culture. Previously ordered Levaquin should adequately cover.    Raeford RazorStephen Tailer Volkert, MD 10/05/13 [id number]

## 2013-10-05 NOTE — ED Notes (Signed)
Pt being transferred to [facility] via [facility] in stable condition. Sats improved with 2 liters n/c 93% Report given to charge nurse Verlon Au

## 2013-10-05 NOTE — ED Notes (Signed)
Pt brought in by daughter today with c/o nausea with 2 episodes of vomiting after eating. Sx started last Thursday. Denies abdominal pain or diarrhea. Pt has long medical hx COPD on home oxygen. States she was given PNA vaccine last week Sats 84%r/a- applied 2 liters n/c

## 2013-10-05 NOTE — H&P (Signed)
[facility] History and Physical  Erica Hansen ZOX:[id number] DOB: 1922-06-04 DOA: 10/05/2013  Referring physician: EDP PCP: Tillman Abide, MD  Specialists:   Chief Complaint:  SOB, Cough, Fever and Chills  HPI: Erica Hansen is a 80 y.o. female with a history of Pulmonary Fibrosis, DM2, HTN who presents to the ED with complaints of worsening SOB cough and fever and chills for the past 3 days.   She reports that she began to feel sick right after she saw her PCP and received a Pneumovax shot this week.  She reports that she gradually became sick and also had increased weakness and nausea and vomiting and loss of appetite.  She was seen at an area [facility] and had desaturation into the 70's and she was found to have a left Mid Lung Infiltrate on Chest X-ray and was referred to the ED.     In the ED, she was started on IV Levaquin and referred for admission.    Review of Systems:  Constitutional: No Weight Loss, No Weight Gain, Night Sweats, +Fevers, +Chills, Fatigue, +Generalized Weakness HEENT: No Headaches, Difficulty Swallowing,Tooth/Dental Problems,Sore Throat,  No Sneezing, Rhinitis, Ear Ache, Nasal Congestion, or Post Nasal Drip,  Cardio-vascular:  No Chest pain, Orthopnea, PND, Edema in lower extremities, Anasarca, Dizziness, Palpitations  Resp: +Dyspnea, No DOE, +Productive Cough,  No Hemoptysis,  No Wheezing.    GI: No Heartburn, Indigestion, Abdominal Pain, +Nausea, +Vomiting, Diarrhea, Change in Bowel Habits,  +Loss of Appetite  GU: No Dysuria, Change in Color of Urine, No Urgency or Frequency.  No Flank pain.  Musculoskeletal: No Joint Pain or Swelling.  No Decreased Range of Motion. +Chronic Back Pain.  Neurologic: No Syncope, No Seizures, Muscle Weakness, Paresthesia, Vision Disturbance or Loss, No Diplopia, No Vertigo, No Difficulty Walking,  Skin: No Rash or Lesions. Psych: No Change in Mood or Affect. No Depression or Anxiety. No Memory loss. No Confusion or  Hallucinations   Past Medical History  Diagnosis Date  . Diverticulosis   . Unspecified essential hypertension   . Osteoarthritis   . Type II or unspecified type diabetes mellitus without mention of complication, not stated as uncontrolled   . Anxiety states   . Lung fibrosis   . Urinary incontinence       Past Surgical History  Procedure Laterality Date  . Abdominal hysterectomy    . Replacement total knee    . Laminectomy    . Humerus fracture surgery         Prior to Admission medications   Medication Sig Start Date End Date Taking? Authorizing Provider  Acetaminophen (TYLENOL ARTHRITIS PAIN PO) Take 1 tablet by mouth 2 (two) times daily as needed.   Yes Historical Provider, MD  aspirin 81 MG tablet Take 81 mg by mouth daily.     Yes Historical Provider, MD  diltiazem (CARDIZEM CD) 120 MG 24 hr capsule Take 1 capsule (120 mg total) by mouth daily. 09/27/12  Yes Karie Schwalbe, MD  famotidine (PEPCID) 20 MG tablet Take 1 tablet (20 mg total) by mouth at bedtime. 10/03/13  Yes Karie Schwalbe, MD  mirtazapine (REMERON) 15 MG tablet Take 1 tablet (15 mg total) by mouth at bedtime. 10/10/12  Yes Karie Schwalbe, MD  Multiple Vitamins-Minerals (CENTRUM SILVER PO) Take 1 capsule by mouth daily.     Yes Historical Provider, MD  nadolol (CORGARD) 80 MG tablet Take 1 tablet (80 mg total) by mouth daily. 10/03/13  Yes Karie Schwalbe,  MD  olmesartan-hydrochlorothiazide (BENICAR HCT) 40-12.5 MG per tablet Take 1 tablet by mouth daily. 10/03/13  Yes Karie Schwalbe, MD  Polyethyl Glycol-Propyl Glycol (SYSTANE) 0.4-0.3 % SOLN Place 2 drops into both eyes at bedtime.    Yes Historical Provider, MD  glucose blood (TRUETEST TEST) test strip Use to test blood sugar once daily dx: 250.00 09/06/13   Karie Schwalbe, MD      Allergies  Allergen Reactions  . Clindamycin     REACTION: diffuse maculopapular rash  . Delsym [Dextromethorphan]     Diarrhea  . Lisinopril     REACTION: cough   . Penicillins   . Sulfonamide Derivatives      Social History:  reports that she has never smoked. She has never used smokeless tobacco. She reports that she drinks alcohol. Her drug history is not on file.     Family History  Problem Relation Age of Onset  . Colon cancer Father        Physical Exam:  GEN:  Pleasant Elderly Well Nourished and Well developed 80 y.o. Caucasian female examined and in no acute distress; cooperative with exam Filed Vitals:   10/05/13 1846 10/05/13 1954 10/05/13 2003 10/05/13 2034  BP:  141/62 138/57 125/63  Pulse:  103 103 99  Temp:  97.8 F (36.6 C)  98.8 F (37.1 C)  TempSrc:  Oral    Resp:  28 26 24   Height:      Weight:      SpO2: 95% 94% 94% 95%   Blood pressure 125/63, pulse 99, temperature 98.8 F (37.1 C), temperature source Oral, resp. rate 24, height 5' (1.524 m), weight 55.792 kg (123 lb), SpO2 95.00%. PSYCH: She is alert and oriented x4; does not appear anxious does not appear depressed; affect is normal HEENT: Normocephalic and Atraumatic, Mucous membranes pink; PERRLA; EOM intact; Fundi:  Benign;  No scleral icterus, Nares: Patent, Oropharynx: Clear, Fair Dentition, Neck:  FROM, no cervical lymphadenopathy nor thyromegaly or carotid bruit; no JVD; Breasts:: Not examined CHEST WALL: No tenderness CHEST: Rhonchorous Breath Sounds, No Wheezes,  Breathing is Unlabored HEART: Regular rate and rhythm; no murmurs rubs or gallops BACK: No kyphosis or scoliosis; no CVA tenderness ABDOMEN: Positive Bowel Sounds, soft non-tender; no masses, no organomegaly. Rectal Exam: Not done EXTREMITIES: No cyanosis, clubbing or edema; no ulcerations. Genitalia: not examined PULSES: 2+ and symmetric SKIN: Normal hydration no rash or ulceration CNS:  Alert and Oriented X 4, No Focal Deficits Vascular: pulses palpable throughout    Labs on Admission:  Basic Metabolic Panel:  Recent Labs Lab 10/03/13 1530 10/05/13 1835  NA 137 137  K 4.4 4.2   CL 100 96  CO2 29 27  GLUCOSE 240* 210*  BUN 33* 32*  CREATININE 1.2 1.06  CALCIUM 9.6 9.0   Liver Function Tests:  Recent Labs Lab 10/03/13 1530  AST 22  ALT 18  ALKPHOS 64  BILITOT 0.5  PROT 7.5  ALBUMIN 3.5   No results found for this basename: LIPASE, AMYLASE,  in the last 168 hours No results found for this basename: AMMONIA,  in the last 168 hours CBC:  Recent Labs Lab 10/03/13 1530 10/05/13 1835  WBC 16.3* 18.9*  NEUTROABS 13.1* 15.9*  HGB 12.6 11.2*  HCT 38.3 33.4*  MCV 94.4 92.8  PLT 291.0 242   Cardiac Enzymes: No results found for this basename: CKTOTAL, CKMB, CKMBINDEX, TROPONINI,  in the last 168 hours  BNP (last 3 results) No results  found for this basename: PROBNP,  in the last 8760 hours CBG:  Recent Labs Lab 10/05/13 2030  GLUCAP 218*    Radiological Exams on Admission: Dg Chest 2 View  10/05/2013   CLINICAL DATA:  Fever, cough  EXAM: CHEST  2 VIEW  COMPARISON:  05/15/2013  FINDINGS: Cardiomediastinal silhouette is stable. Extensive fibrotic changes and chronic interstitial prominence again noted bilaterally. There is superimposed haziness in left midlung and left lower lobe suspicious for superimposed infiltrate/ pneumonia . Followup examination to assure resolution recommended.  IMPRESSION: Extensive fibrotic changes and chronic interstitial prominence again noted bilaterally. There is superimposed haziness in left midlung and left lower lobe suspicious for superimposed infiltrate/ pneumonia . Followup examination to assure resolution recommended.   Electronically Signed   By: Natasha MeadLiviu  Pop M.D.   On: 10/05/2013 16:54      EKG: Independently reviewed. Normal sinus Rhythm rate 94 No acute changes.      Assessment/Plan:   80 y.o. female with  Principal Problem:   Sepsis Active Problems:   PULMONARY FIBROSIS, CHRONIC   Chronic respiratory failure   CAP (community acquired pneumonia)   DIABETES MELLITUS, TYPE II, WITH RENAL  COMPLICATIONS   HYPERTENSION   Chronic kidney disease, stage III (moderate)   Leukocytosis     1.  Sepsis- due to CAP-  IV Levaquin, Albuterol Nebs, and IVFs.  Continuous O2 monitoring, and NCO2.     2.  CAP-  See #1.    3.  Chronic Respiratory Failure-  [place] O2.  Titrate PRN to keep O2 sats > 92%.     4.  Pulmonary Fibrosis-  cause of #3.    5.  HTN-  Continue Diltiazem and Corgard Rx.  Holding Benicar due to #7.     6.  DM2- diet controlled, check HbA1c in AM, and monitor Glucose levels SSI coverage PRN.    7.  CKD with AKI-  Monitor BUN/Cr , IVFs should improve the level.  Avoid Fluid Overload.    8.  Leukocytosis- due top #1 and #2.   Monitor trend.  On Abxs.    9.   DVT prophylaxis with Lovenox.        Code Status:    FULL CODE   Family Communication:    Daughter at Bedside Disposition Plan:       Inpatient  Time spent:  60 minutes  Ron ParkerHarvette C Juandaniel Manfredo [facility] Pager [phone]  If 7PM-7AM, please contact night-coverage [email or web address] Password [email or web address] 10/05/2013, 8:36 PM

## 2013-10-06 ENCOUNTER — Encounter: Payer: Self-pay | Admitting: Internal Medicine

## 2013-10-06 LAB — BASIC METABOLIC PANEL
BUN: 27 mg/dL — AB (ref 6–23)
CHLORIDE: 100 meq/L (ref 96–112)
CO2: 26 mEq/L (ref 19–32)
Calcium: 8.4 mg/dL (ref 8.4–10.5)
Creatinine, Ser: 1.07 mg/dL (ref 0.50–1.10)
GFR calc Af Amer: 52 mL/min — ABNORMAL LOW (ref >90)
GFR calc non Af Amer: 45 mL/min — ABNORMAL LOW (ref >90)
GLUCOSE: 139 mg/dL — AB (ref 70–99)
Potassium: 4.1 mEq/L (ref 3.7–5.3)
Sodium: 138 mEq/L (ref 137–147)

## 2013-10-06 LAB — GLUCOSE, CAPILLARY
GLUCOSE-CAPILLARY: 126 mg/dL — AB (ref 70–99)
GLUCOSE-CAPILLARY: 108 mg/dL — AB (ref 70–99)
Glucose-Capillary: 167 mg/dL — ABNORMAL HIGH (ref 70–99)
Glucose-Capillary: 134 mg/dL — ABNORMAL HIGH (ref 70–99)

## 2013-10-06 LAB — CBC
HEMATOCRIT: 32.7 % — AB (ref 36.0–46.0)
HEMOGLOBIN: 10.3 g/dL — AB (ref 12.0–15.0)
MCH: 30 pg (ref 26.0–34.0)
MCHC: 31.5 g/dL (ref 30.0–36.0)
MCV: 95.3 fL (ref 78.0–100.0)
Platelets: 227 10*3/uL (ref 150–400)
RBC: 3.43 MIL/uL — ABNORMAL LOW (ref 3.87–5.11)
RDW: 13.7 % (ref 11.5–15.5)
WBC: 14.7 10*3/uL — ABNORMAL HIGH (ref 4.0–10.5)

## 2013-10-06 LAB — HEMOGLOBIN A1C
Hgb A1c MFr Bld: 6.7 % — ABNORMAL HIGH (ref <5.7)
Mean Plasma Glucose: 146 mg/dL — ABNORMAL HIGH (ref <117)

## 2013-10-06 MED ORDER — LEVOFLOXACIN 500 MG PO TABS
500.0000 mg | ORAL_TABLET | Freq: Every day | ORAL | Status: DC
  Administered 2013-10-06: 500 mg via ORAL
  Filled 2013-10-06 (×2): qty 1

## 2013-10-06 NOTE — Progress Notes (Signed)
Patient Demographics  Erica Hansen, is a 80 y.o. female, DOB - [birth year], FOY:[id number]  Admit date - 10/05/2013   Admitting Physician Ron Parker, MD  Outpatient Primary MD for the patient is Tillman Abide, MD  LOS - 1   Chief Complaint  Patient presents with  . Shortness of Breath           Subjective:   Erica Hansen today has, No headache, No chest pain, No abdominal pain - No Nausea, No new weakness tingling or numbness, improved Cough - SOB.     Assessment & Plan     1. Sepsis due CAP has Chr Resp Failure due to Pulm Fibrosis - improving with Emp Levaquin, on home o2 which will be titrated for comfort, nebs as needed, will add Chest PT and Pulm toiletry, increase activity.     2.DM 2 - ISS, on diet control at home  Lab Results  Component Value Date   HGBA1C 7.1* 10/03/2013   CBG (last 3)   Recent Labs  10/05/13 2030 10/05/13 2255 10/06/13 0607  GLUCAP 218* 134* 126*       3.HTN - on Cardizem and Nadolol     4. Chronic respiratory failure due to pulmonary fibrosis -  see #1 above she is on home oxygen      Code Status: Full  Family Communication: daughter [phone]  Disposition Plan: TBD   Procedures     Consults      Medications  Scheduled Meds: . albuterol  2.5 mg Nebulization Q6H  . diltiazem  120 mg Oral Daily  . enoxaparin (LOVENOX) injection  30 mg Subcutaneous Q24H  . famotidine  20 mg Oral QHS  . insulin aspart  0-5 Units Subcutaneous QHS  . insulin aspart  0-9 Units Subcutaneous TID WC  . levofloxacin (LEVAQUIN) IV  500 mg Intravenous Q24H  . mirtazapine  15 mg Oral QHS  . multivitamin  1 tablet Oral q morning - 10a  . nadolol  80 mg Oral Daily  . polyvinyl alcohol  2 drop Both Eyes QHS  . sodium chloride  500 mL  Intravenous Once   Continuous Infusions:  PRN Meds:.acetaminophen, acetaminophen, albuterol, alum & mag hydroxide-simeth, HYDROmorphone (DILAUDID) injection, ondansetron (ZOFRAN) IV, ondansetron, oxyCODONE  DVT Prophylaxis  Lovenox    Lab Results  Component Value Date   PLT 227 10/06/2013    Antibiotics     Anti-infectives   Start     Dose/Rate Route Frequency Ordered Stop   10/06/13 1800  levofloxacin (LEVAQUIN) IVPB 500 mg     500 mg 100 mL/hr over 60 Minutes Intravenous Every 24 hours 10/05/13 2024     10/05/13 1830  levofloxacin (LEVAQUIN) IVPB 500 mg     500 mg 100 mL/hr over 60 Minutes Intravenous  Once 10/05/13 1810 10/05/13 1952          Objective:   Filed Vitals:   10/05/13 2122 10/06/13 0105 10/06/13 0651 10/06/13 0840  BP: 132/65 117/62 147/90   Pulse: 105 94 98   Temp: 98.6 F (37 C) 98.6 F (37 C) 97.7 F (36.5 C)   TempSrc: Oral Oral Oral   Resp: 22 20 20    Height: 5' (1.524 m)     Weight:  56.9 kg (125 lb 7.1 oz)     SpO2: 95% 92% 94% 94%    Wt Readings from Last 3 Encounters:  10/05/13 56.9 kg (125 lb 7.1 oz)  10/03/13 55.792 kg (123 lb)  06/28/13 56.7 kg (125 lb)    No intake or output data in the 24 hours ending 10/06/13 1010   Physical Exam  Awake Alert,  No new F.N deficits, Normal affect [place].AT,PERRAL Supple Neck,No JVD, No cervical lymphadenopathy appriciated.  Symmetrical Chest wall movement, Good air movement bilaterally, Coarse bilat B sounds RRR,No Gallops,Rubs or new Murmurs, No Parasternal Heave +ve B.Sounds, Abd Soft, No tenderness, No organomegaly appriciated, No rebound - guarding or rigidity. No Cyanosis, Clubbing or edema, No new Rash or bruise      Data Review   Micro Results No results found for this or any previous visit (from the past 240 hour(s)).  Radiology Reports Dg Chest 2 View  10/05/2013   CLINICAL DATA:  Fever, cough  EXAM: CHEST  2 VIEW  COMPARISON:  05/15/2013  FINDINGS: Cardiomediastinal silhouette is  stable. Extensive fibrotic changes and chronic interstitial prominence again noted bilaterally. There is superimposed haziness in left midlung and left lower lobe suspicious for superimposed infiltrate/ pneumonia . Followup examination to assure resolution recommended.  IMPRESSION: Extensive fibrotic changes and chronic interstitial prominence again noted bilaterally. There is superimposed haziness in left midlung and left lower lobe suspicious for superimposed infiltrate/ pneumonia . Followup examination to assure resolution recommended.   Electronically Signed   By: Natasha Mead M.D.   On: 10/05/2013 16:54    CBC  Recent Labs Lab 10/03/13 1530 10/05/13 1835 10/06/13 0648  WBC 16.3* 18.9* 14.7*  HGB 12.6 11.2* 10.3*  HCT 38.3 33.4* 32.7*  PLT 291.0 242 227  MCV 94.4 92.8 95.3  MCH  --  31.1 30.0  MCHC 32.8 33.5 31.5  RDW 13.2 13.5 13.7  LYMPHSABS 1.9 1.7  --   MONOABS 0.8 1.3*  --   EOSABS 0.4 0.0  --   BASOSABS 0.1 0.0  --     Chemistries   Recent Labs Lab 10/03/13 1530 10/05/13 1835 10/06/13 0648  NA 137 137 138  K 4.4 4.2 4.1  CL 100 96 100  CO2 29 27 26   GLUCOSE 240* 210* 139*  BUN 33* 32* 27*  CREATININE 1.2 1.06 1.07  CALCIUM 9.6 9.0 8.4  AST 22  --   --   ALT 18  --   --   ALKPHOS 64  --   --   BILITOT 0.5  --   --    ------------------------------------------------------------------------------------------------------------------ estimated creatinine clearance is 28.2 ml/min (by C-G formula based on Cr of 1.07). ------------------------------------------------------------------------------------------------------------------  Recent Labs  10/03/13 1530  HGBA1C 7.1*   ------------------------------------------------------------------------------------------------------------------  Recent Labs  10/03/13 1530  CHOL 157  HDL 30.50*  LDLCALC 89  TRIG 188.0*  CHOLHDL 5    ------------------------------------------------------------------------------------------------------------------  Recent Labs  10/03/13 1530  TSH 1.93   ------------------------------------------------------------------------------------------------------------------ No results found for this basename: VITAMINB12, FOLATE, FERRITIN, TIBC, IRON, RETICCTPCT,  in the last 72 hours  Coagulation profile No results found for this basename: INR, PROTIME,  in the last 168 hours  No results found for this basename: DDIMER,  in the last 72 hours  Cardiac Enzymes No results found for this basename: CK, CKMB, TROPONINI, MYOGLOBIN,  in the last 168 hours ------------------------------------------------------------------------------------------------------------------ No components found with this basename: POCBNP,      Time Spent in minutes   35  Leroy SeaPrashant K Singh M.D on 10/06/2013 at 10:10 AM  Between 7am to 7pm - Pager - [phone]  After 7pm go to [email or web address] - password [email or web address]  And look for the night coverage person covering for me after hours  [facility] Office  [phone]   **Disclaimer: This note may have been dictated with voice recognition software. Similar sounding words can inadvertently be transcribed and this note may contain transcription errors which may not have been corrected upon publication of note.**

## 2013-10-06 NOTE — Evaluation (Signed)
Physical Therapy Evaluation Patient Details Name: Erica Hansen MRN: [id number] DOB: March 13, 1923 Today's Date: 10/06/2013   History of Present Illness  Admitted with Sepsis due to pneumonia, has Chr Resp Failure due to Pulm Fibrosis .  Clinical Impression  Pt presents likely at a slightly lower functional level than prior to admit. Will benefit from in-hospital PT to maximize mobility prior to DC home with support of family.      Follow Up Recommendations No PT follow up;Supervision - Intermittent (Pt wears a necklace where she can call for assist if needed )    Equipment Recommendations  None recommended by PT    Recommendations for Other Services       Precautions / Restrictions Precautions Precautions: Fall Restrictions Weight Bearing Restrictions: No      Mobility  Bed Mobility Overal bed mobility:  (not tested as pt up in chair)                Transfers Overall transfer level: Needs assistance Equipment used: Ambulation equipment used Transfers: Sit to/from Stand Sit to Stand: Min guard            Ambulation/Gait Ambulation/Gait assistance: Min guard Ambulation Distance (Feet): 100 Feet Assistive device: Rolling walker (2 wheeled) Gait Pattern/deviations: Step-through pattern;Decreased stride length     General Gait Details: pt typically uses a cane but wanted to use a RW today  Stairs            Wheelchair Mobility    Modified Rankin (Stroke Patients Only)       Balance Overall balance assessment: History of Falls (Pt had fall just prior to admit and one in July 2014)                                           Pertinent Vitals/Pain Pt did not report any pain.     Home Living Family/patient expects to be discharged to:: Private residence Living Arrangements: Children Available Help at Discharge: Family (daughter and son in law live [place] pt) Type of Home: House Home Access: Level entry     Home Layout:  Able to live on main level with bedroom/bathroom (Pts daughter lives on the floor above. ) Home Equipment: Gilmer Mor - single point      Prior Function Level of Independence: Independent with assistive device(s) (For basic mobility)               Hand Dominance        Extremity/Trunk Assessment   Upper Extremity Assessment: Overall WFL for tasks assessed           Lower Extremity Assessment: Overall WFL for tasks assessed (fatigues quickly with activity)      Cervical / Trunk Assessment: Normal  Communication   Communication: No difficulties  Cognition Arousal/Alertness: Awake/alert Behavior During Therapy: WFL for tasks assessed/performed Overall Cognitive Status: Within Functional Limits for tasks assessed                      General Comments General comments (skin integrity, edema, etc.): pts daughter present for entire session. Daughter asked for LE exercises for pt therefore issued HEP of ankle pumps, heel slides, Long arc quads, Hip abd/Add in supine,a nd seated marching in place    Exercises General Exercises - Lower Extremity Ankle Circles/Pumps: Other (comment) (Demonstarted exercises to pt but she did nt perform today)  Assessment/Plan    PT Assessment Patient needs continued PT services  PT Diagnosis Generalized weakness   PT Problem List Decreased activity tolerance;Decreased mobility  PT Treatment Interventions Gait training;Therapeutic activities;Therapeutic exercise;Patient/family education   PT Goals (Current goals can be found in the Care Plan section) Acute Rehab PT Goals Patient Stated Goal: to go home without oxygen during the day PT Goal Formulation: With patient/family Time For Goal Achievement: 10/13/13 Potential to Achieve Goals: Good    Frequency Min 3X/week   Barriers to discharge        Co-evaluation               End of Session Equipment Utilized During Treatment: Gait belt;Oxygen (3 liters of  oxygen) Activity Tolerance: Patient tolerated treatment well (Pt SOB after walking. She feels like that is her baseline) Patient left: in chair;with chair alarm set;with family/visitor present           Time: [id number] PT Time Calculation (min): 40 min   Charges:   PT Evaluation $Initial PT Evaluation Tier I: 1 Procedure PT Treatments $Gait Training: 8-22 mins $Therapeutic Exercise: 8-22 mins   PT G CodesRoseanna Rainbow:          Corrinna Karapetyan J [facility] 10/06/2013, 3:07 PM Lavona MoundMark Darina Hartwell, PT  [phone] 10/06/2013

## 2013-10-07 ENCOUNTER — Encounter: Payer: Self-pay | Admitting: Internal Medicine

## 2013-10-07 ENCOUNTER — Inpatient Hospital Stay (HOSPITAL_COMMUNITY): Payer: Medicare PPO

## 2013-10-07 DIAGNOSIS — J9620 Acute and chronic respiratory failure, unspecified whether with hypoxia or hypercapnia: Secondary | ICD-10-CM | POA: Diagnosis present

## 2013-10-07 DIAGNOSIS — J189 Pneumonia, unspecified organism: Secondary | ICD-10-CM

## 2013-10-07 DIAGNOSIS — J8410 Pulmonary fibrosis, unspecified: Secondary | ICD-10-CM

## 2013-10-07 LAB — GLUCOSE, CAPILLARY
GLUCOSE-CAPILLARY: 118 mg/dL — AB (ref 70–99)
Glucose-Capillary: 175 mg/dL — ABNORMAL HIGH (ref 70–99)
Glucose-Capillary: 156 mg/dL — ABNORMAL HIGH (ref 70–99)
Glucose-Capillary: 142 mg/dL — ABNORMAL HIGH (ref 70–99)

## 2013-10-07 LAB — PRO B NATRIURETIC PEPTIDE: PRO B NATRI PEPTIDE: 8423 pg/mL — AB (ref 0–450)

## 2013-10-07 LAB — CBC
HCT: 29.6 % — ABNORMAL LOW (ref 36.0–46.0)
HEMOGLOBIN: 9.6 g/dL — AB (ref 12.0–15.0)
MCH: 30.7 pg (ref 26.0–34.0)
MCHC: 32.4 g/dL (ref 30.0–36.0)
MCV: 94.6 fL (ref 78.0–100.0)
Platelets: 221 10*3/uL (ref 150–400)
RBC: 3.13 MIL/uL — ABNORMAL LOW (ref 3.87–5.11)
RDW: 13.5 % (ref 11.5–15.5)
WBC: 12.9 10*3/uL — ABNORMAL HIGH (ref 4.0–10.5)

## 2013-10-07 LAB — URINE CULTURE: Colony Count: 30000

## 2013-10-07 MED ORDER — ALBUTEROL SULFATE (2.5 MG/3ML) 0.083% IN NEBU
2.5000 mg | INHALATION_SOLUTION | Freq: Three times a day (TID) | RESPIRATORY_TRACT | Status: DC
  Administered 2013-10-07 – 2013-10-09 (×5): 2.5 mg via RESPIRATORY_TRACT
  Filled 2013-10-07 (×5): qty 3

## 2013-10-07 MED ORDER — LEVOFLOXACIN 250 MG PO TABS
250.0000 mg | ORAL_TABLET | Freq: Every day | ORAL | Status: DC
  Administered 2013-10-07 – 2013-10-08 (×2): 250 mg via ORAL
  Filled 2013-10-07 (×3): qty 1

## 2013-10-07 MED ORDER — PREDNISONE 20 MG PO TABS
20.0000 mg | ORAL_TABLET | Freq: Every day | ORAL | Status: DC
  Administered 2013-10-07 – 2013-10-08 (×2): 20 mg via ORAL
  Filled 2013-10-07 (×3): qty 1

## 2013-10-07 MED ORDER — FUROSEMIDE 20 MG PO TABS
20.0000 mg | ORAL_TABLET | Freq: Every day | ORAL | Status: DC
  Administered 2013-10-07 – 2013-10-09 (×3): 20 mg via ORAL
  Filled 2013-10-07 (×3): qty 1

## 2013-10-07 NOTE — Consult Note (Signed)
Name: Erica Hansen MRN: [id number] DOB: [birth year]    ADMISSION DATE:  10/05/2013 CONSULTATION DATE:  10/07/2013  REFERRING MD :  Candiss Norse PRIMARY SERVICE:  [facility]  CHIEF COMPLAINT:  Shortness of breath, cough   HISTORY OF PRESENT ILLNESS:  [name], never smoker well known to me with IPF , last seen by me in feb 2015. She is admitted now with 'pneumonia '  She was treated with empiric low dose steroids for cough since 06/2008 with good response  PFTs 11/2007 >>FVC 59% - mod restriction with desaturation on exertion.  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 CXR in UC showed worsening infiltrates & hypoxia was worse, hence admitted. Treated with levaquin. She reports increasing pedal edema   PAST MEDICAL HISTORY :  Past Medical History  Diagnosis Date  . Diverticulosis   . Unspecified essential hypertension   . Osteoarthritis   . Type II or unspecified type diabetes mellitus without mention of complication, not stated as uncontrolled   . Anxiety states   . Lung fibrosis   . Urinary incontinence    Past Surgical History  Procedure Laterality Date  . Abdominal hysterectomy    . Replacement total knee    . Laminectomy    . Humerus fracture surgery     Prior to Admission medications   Medication Sig Start Date End Date Taking? Authorizing Provider  Acetaminophen (TYLENOL ARTHRITIS PAIN PO) Take 1 tablet by mouth 2 (two) times daily as needed.   Yes Historical Provider, MD  aspirin 81 MG tablet Take 81 mg by mouth daily.     Yes Historical Provider, MD  diltiazem (CARDIZEM CD) 120 MG 24 hr capsule Take 1 capsule (120 mg total) by mouth daily. 09/27/12  Yes Venia Carbon, MD  famotidine (PEPCID) 20 MG tablet Take 1 tablet (20 mg total) by  mouth at bedtime. 10/03/13  Yes Venia Carbon, MD  mirtazapine (REMERON) 15 MG tablet Take 1 tablet (15 mg total) by mouth at bedtime. 10/10/12  Yes Venia Carbon, MD  Multiple Vitamins-Minerals (CENTRUM SILVER PO) Take 1 capsule by mouth daily.     Yes Historical Provider, MD  nadolol (CORGARD) 80 MG tablet Take 1 tablet (80 mg total) by mouth daily. 10/03/13  Yes Venia Carbon, MD  olmesartan-hydrochlorothiazide (BENICAR HCT) 40-12.5 MG per tablet Take 1 tablet by mouth daily. 10/03/13  Yes Venia Carbon, MD  Polyethyl Glycol-Propyl Glycol (SYSTANE) 0.4-0.3 % SOLN Place 2 drops into both eyes at bedtime.    Yes Historical Provider, MD  glucose blood (TRUETEST TEST) test strip Use to test blood sugar once daily dx: 250.00 09/06/13   Venia Carbon, MD   Allergies  Allergen Reactions  . Clindamycin     REACTION: diffuse maculopapular rash  . Delsym [Dextromethorphan]     Diarrhea  . Lisinopril     REACTION: cough  . Penicillins   . Sulfonamide Derivatives     FAMILY HISTORY:  Family History  Problem Relation Age of Onset  . Colon cancer Father    SOCIAL HISTORY:  reports that she has never smoked. She has never used smokeless tobacco. She reports that she drinks alcohol. Her drug history is not on  file.  REVIEW OF SYSTEMS:   Constitutional: Negative for fever, chills, weight loss, malaise/fatigue and diaphoresis.  HENT: Negative for hearing loss, ear pain, nosebleeds, congestion, sore throat, neck pain, tinnitus and ear discharge.   Eyes: Negative for blurred vision, double vision, photophobia, pain, discharge and redness.  Respiratory: Negative for  hemoptysis, sputum production,  wheezing and stridor.   Cardiovascular: Negative for chest pain, palpitations, orthopnea, claudication, leg swelling and PND.  Gastrointestinal: Negative for heartburn, abdominal pain, diarrhea, constipation, blood in stool and melena.  Genitourinary: Negative for dysuria, urgency, frequency,  hematuria and flank pain.  Musculoskeletal: Negative for myalgias, back pain, joint pain and falls.  Skin: Negative for itching and rash.  Neurological: Negative for dizziness, tingling, tremors, sensory change, speech change, focal weakness, seizures, loss of consciousness, weakness and headaches.  Endo/Heme/Allergies: Negative for environmental allergies and polydipsia. Does not bruise/bleed easily.  SUBJECTIVE:   VITAL SIGNS: Temp:  [97.9 F (36.6 C)-99.3 F (37.4 C)] 98.5 F (36.9 C) (05/25 1521) Pulse Rate:  [68-87] 68 (05/25 1521) Resp:  [18-22] 20 (05/25 1521) BP: (109-158)/(41-78) 134/41 mmHg (05/25 1521) SpO2:  [77 %-97 %] 97 % (05/25 1550)  PHYSICAL EXAMINATION: Gen. Pleasant, well-nourished, in no distress, normal affect  ENT - no lesions, no post nasal drip  Neck: No JVD, no thyromegaly, no carotid bruits  Lungs: no use of accessory muscles, no dullness to percussion, bL rales 1/2 , no rhonchi  Cardiovascular: Rhythm regular, heart sounds normal, no murmurs or gallops, 1+ peripheral edema  Abdomen: soft and non-tender, no hepatosplenomegaly, BS normal.  Musculoskeletal: No deformities, no cyanosis or clubbing  Neuro: alert, non focal    Recent Labs Lab 10/03/13 1530 10/05/13 1835 10/06/13 0648  NA 137 137 138  K 4.4 4.2 4.1  CL 100 96 100  CO2 _0 BUN 33* 32* 27*  CREATININE 1.2 1.06 1.07  GLUCOSE 240* 210* 139*    Recent Labs Lab 10/05/13 1835 10/06/13 0648 10/07/13 0341  HGB 11.2* 10.3* 9.6*  HCT 33.4* 32.7* 29.6*  WBC 18.9* 14.7* 12.9*  PLT 242 227 221   Dg Chest 2 View  10/07/2013   CLINICAL DATA:  Pneumonia, cough and shortness of breath  EXAM: CHEST  2 VIEW  COMPARISON:  Prior chest x-ray 10/05/2013  FINDINGS: Interval progression of patchy interstitial and airspace opacities in the right mid lung. The cardiac and mediastinal contours remain unchanged. Atherosclerotic calcifications are still present in the transverse aorta. No  pneumothorax or new effusion. No acute osseous abnormality.  IMPRESSION: Interval progression of multi focal interstitial and airspace opacities in the right mid lung concerning for progression of multifocal pneumonia/pneumonitis.  There may be a slight interval progression of airspace disease in the left suprahilar and left lower lung regions as well.  Underlying chronic lung changes consistent with interstitial fibrosis.   Electronically Signed   By: Jacqulynn Cadet M.D.   On: 10/07/2013 09:09    ASSESSMENT / PLAN:  IPF Worsening infiltrates could represent CAP -note leucocytosis or simply an IPF flare -her main symptom has been cough Pedal edema could simply be due to cardizem or represent cor pulmonale  Recommend -  Start 20 mg prednisone for IPF flare & cough Lasix 20 mg for cor pulmonale, check BNP Dc continuous pulse ox She has clearly expressed DNR wishes & I will reinstate this  D/w pt & daughter  Kara Mead MD. FCCP. [place] Pager [phone] If no response call [phone]    10/07/2013, 5:05 PM

## 2013-10-07 NOTE — Progress Notes (Signed)
[facility] PROGRESS NOTE  Erica Hansen DQQ:[id number] DOB: 01/04/1923 DOA: 10/05/2013 PCP: Tillman Abide, MD  Assessment/Plan: #1 sepsis due to community-acquired pneumonia with underlying chronic respiratory failure secondary to pulmonary fibrosis Patient still requiring increased O2. Patient is currently afebrile. WBC is trending down. Chest x-ray shows worsening multifocal interstitial and as this opacities in the right midlung concerning for progression of multifocal pneumonia/pneumonitis. Slight interval progression of airspace disease in the left suprahilar and left lower lung regions as well. Underlying chronic lung changes consistent with interstitial fibrosis. Continue empiric Levaquin. Patient has been seen by pulmonary Dr. Vassie Loll who feels worsening may be an IPF flare. Patient has been started on prednisone 20 mg daily, Lasix 20 mg daily. Pro BNP is pending. Follow. Pulmonary following.  #2 acute on chronic respiratory failure/?? IPF flare/community-acquired pneumonia Likely multifactorial secondary to IPF flare in the setting of probable underlying community-acquired pneumonia and possible volume overload. Patient currently requiring 4 L nasal cannula from 2 L at baseline. Chest x-ray shows worsening infiltrates/progression of opacities worst L4 progression of multifocal pneumonia/pneumonitis. Patient has been seen by Dr. Vassie Loll pulmonary. Patient has been started on prednisone 20 mg daily for IPF flare cough, Lasix 20 mg daily for possible volume overload. Pro BNP has been ordered and is pending. Pulmonary following and appreciate input and recommendations.  #3 diet controlled diabetes mellitus 2 Hemoglobin A1c 7.1 10/03/2013. CBGs have ranged from 108-175. Patient currently on sliding scale insulin. Prednisone has been added to patient's regimen and a such will need to monitor CBGs closely. Follow.  #4 hypertension Continue Cardizem and nadolol  #5 chronic respiratory failure  secondary to pulmonary fibrosis See problem  #1, #2.  #6 chronic kidney disease Stable. Follow.  #7 prophylaxis Pepcid for GI prophylaxis. Lovenox for DVT prophylaxis.  Code Status: DO NOT RESUSCITATE Family Communication: Updated patient and daughter at bedside. Disposition Plan: Home when medically stable.   Consultants:  Pulmonary: Dr. Vassie Loll 10/07/2013  Procedures:  Chest x-ray 10/05/2013, 10/07/2013    Antibiotics:  IV Levaquin 10/05/2013>>> 10/06/2013  Oral Levaquin 10/06/2013  HPI/Subjective: Patient still with complaints of cough. Patient does not feel significantly better than on presentation.  Objective: Filed Vitals:   10/07/13 1737  BP: 172/52  Pulse: 85  Temp: 97.6 F (36.4 C)  Resp: 22   No intake or output data in the 24 hours ending 10/07/13 1830 Filed Weights   10/05/13 1740 10/05/13 2122  Weight: 55.792 kg (123 lb) 56.9 kg (125 lb 7.1 oz)    Exam:   General:  NAD  Cardiovascular: RRR  Respiratory: Diffuse crackles  Abdomen: Soft, nontender, nondistended, positive bowel sounds.  Musculoskeletal: No clubbing cyanosis trace to 1+ edema  Data Reviewed: Basic Metabolic Panel:  Recent Labs Lab 10/03/13 1530 10/05/13 1835 10/06/13 0648  NA 137 137 138  K 4.4 4.2 4.1  CL 100 96 100  CO2 29 27 26   GLUCOSE 240* 210* 139*  BUN 33* 32* 27*  CREATININE 1.2 1.06 1.07  CALCIUM 9.6 9.0 8.4   Liver Function Tests:  Recent Labs Lab 10/03/13 1530  AST 22  ALT 18  ALKPHOS 64  BILITOT 0.5  PROT 7.5  ALBUMIN 3.5   No results found for this basename: LIPASE, AMYLASE,  in the last 168 hours No results found for this basename: AMMONIA,  in the last 168 hours CBC:  Recent Labs Lab 10/03/13 1530 10/05/13 1835 10/06/13 0648 10/07/13 0341  WBC 16.3* 18.9* 14.7* 12.9*  NEUTROABS 13.1* 15.9*  --   --  HGB 12.6 11.2* 10.3* 9.6*  HCT 38.3 33.4* 32.7* 29.6*  MCV 94.4 92.8 95.3 94.6  PLT 291.0 242 227 221   Cardiac Enzymes: No  results found for this basename: CKTOTAL, CKMB, CKMBINDEX, TROPONINI,  in the last 168 hours BNP (last 3 results) No results found for this basename: PROBNP,  in the last 8760 hours CBG:  Recent Labs Lab 10/06/13 1632 10/06/13 2108 10/07/13 0640 10/07/13 1159 10/07/13 1754  GLUCAP 134* 108* 118* 175* 156*    Recent Results (from the past 240 hour(s))  URINE CULTURE     Status: None   Collection Time    10/05/13  7:05 PM      Result Value Ref Range Status   Specimen Description URINE, CLEAN CATCH   Final   Special Requests ADD [id number]   Final   Culture  Setup Time     Final   Value: 10/06/2013 03:48     Performed at [facility]   Colony Count     Final   Value: 30,000 COLONIES/ML     Performed at [facility]   Culture     Final   Value: Multiple bacterial morphotypes present, none predominant. Suggest appropriate recollection if clinically indicated.     Performed at [facility]   Report Status 10/07/2013 FINAL   Final     Studies: Dg Chest 2 View  10/07/2013   CLINICAL DATA:  Pneumonia, cough and shortness of breath  EXAM: CHEST  2 VIEW  COMPARISON:  Prior chest x-ray 10/05/2013  FINDINGS: Interval progression of patchy interstitial and airspace opacities in the right mid lung. The cardiac and mediastinal contours remain unchanged. Atherosclerotic calcifications are still present in the transverse aorta. No pneumothorax or new effusion. No acute osseous abnormality.  IMPRESSION: Interval progression of multi focal interstitial and airspace opacities in the right mid lung concerning for progression of multifocal pneumonia/pneumonitis.  There may be a slight interval progression of airspace disease in the left suprahilar and left lower lung regions as well.  Underlying chronic lung changes consistent with interstitial fibrosis.   Electronically Signed   By: Malachy MoanHeath  McCullough M.D.   On: 10/07/2013 09:09    Scheduled Meds: . albuterol  2.5 mg  Nebulization TID  . diltiazem  120 mg Oral Daily  . enoxaparin (LOVENOX) injection  30 mg Subcutaneous Q24H  . famotidine  20 mg Oral QHS  . furosemide  20 mg Oral Daily  . insulin aspart  0-5 Units Subcutaneous QHS  . insulin aspart  0-9 Units Subcutaneous TID WC  . levofloxacin  250 mg Oral Daily  . mirtazapine  15 mg Oral QHS  . multivitamin  1 tablet Oral q morning - 10a  . nadolol  80 mg Oral Daily  . polyvinyl alcohol  2 drop Both Eyes QHS  . predniSONE  20 mg Oral Q breakfast  . sodium chloride  500 mL Intravenous Once   Continuous Infusions:   Principal Problem:   Sepsis Active Problems:   Acute-on-chronic respiratory failure   DIABETES MELLITUS, TYPE II, WITH RENAL COMPLICATIONS   HYPERTENSION   PULMONARY FIBROSIS, CHRONIC   Chronic respiratory failure   Chronic kidney disease, stage III (moderate)   CAP (community acquired pneumonia)   Leukocytosis    Time spent: 35 mins    Rodolph Bonganiel V Prentiss Hammett MD [facility] Pager [phone]. If 7PM-7AM, please contact night-coverage at [email or web address], password [email or web address] 10/07/2013, 6:30 PM  LOS: 2 days

## 2013-10-07 NOTE — Progress Notes (Signed)
Nutrition Brief Note  Patient identified on the Malnutrition Screening Tool (MST) Report. Patient with an insignificant 4% weight loss over the past 6 months. PO intake is good.  Wt Readings from Last 15 Encounters:  10/05/13 125 lb 7.1 oz (56.9 kg)  10/03/13 123 lb (55.792 kg)  06/28/13 125 lb (56.7 kg)  05/15/13 122 lb (55.339 kg)  04/10/13 132 lb (59.875 kg)  04/05/13 131 lb (59.421 kg)  03/19/13 129 lb (58.514 kg)  02/06/13 131 lb 3.2 oz (59.512 kg)  09/27/12 133 lb 8 oz (60.555 kg)  07/23/12 131 lb 3.2 oz (59.512 kg)  06/01/12 130 lb 8 oz (59.194 kg)  04/19/12 124 lb (56.246 kg)  02/13/12 129 lb (58.514 kg)  01/20/12 131 lb (59.421 kg)  10/20/11 137 lb (62.143 kg)    Body mass index is 24.5 kg/(m^2). Patient meets criteria for normal weight based on current BMI.   Current diet order is Heart Healthy/CHO-modified, patient is consuming approximately 75% of meals at this time. Labs and medications reviewed.   No nutrition interventions warranted at this time. If nutrition issues arise, please consult RD.   Joaquin Courts, RD, LDN, CNSC Pager [phone] After Hours Pager [phone]

## 2013-10-08 DIAGNOSIS — I279 Pulmonary heart disease, unspecified: Secondary | ICD-10-CM

## 2013-10-08 LAB — BASIC METABOLIC PANEL
BUN: 33 mg/dL — ABNORMAL HIGH (ref 6–23)
CALCIUM: 9.3 mg/dL (ref 8.4–10.5)
CO2: 29 mEq/L (ref 19–32)
Chloride: 99 mEq/L (ref 96–112)
Creatinine, Ser: 0.99 mg/dL (ref 0.50–1.10)
GFR, EST AFRICAN AMERICAN: 57 mL/min — AB (ref >90)
GFR, EST NON AFRICAN AMERICAN: 49 mL/min — AB (ref >90)
Glucose, Bld: 206 mg/dL — ABNORMAL HIGH (ref 70–99)
Potassium: 4.6 mEq/L (ref 3.7–5.3)
SODIUM: 140 meq/L (ref 137–147)

## 2013-10-08 LAB — CBC WITH DIFFERENTIAL/PLATELET
BASOS ABS: 0 10*3/uL (ref 0.0–0.1)
Basophils Relative: 0 % (ref 0–1)
EOS ABS: 0 10*3/uL (ref 0.0–0.7)
EOS PCT: 0 % (ref 0–5)
HCT: 33.2 % — ABNORMAL LOW (ref 36.0–46.0)
Hemoglobin: 10.7 g/dL — ABNORMAL LOW (ref 12.0–15.0)
LYMPHS PCT: 7 % — AB (ref 12–46)
Lymphs Abs: 0.7 10*3/uL (ref 0.7–4.0)
MCH: 30.3 pg (ref 26.0–34.0)
MCHC: 32.2 g/dL (ref 30.0–36.0)
MCV: 94.1 fL (ref 78.0–100.0)
Monocytes Absolute: 0.3 10*3/uL (ref 0.1–1.0)
Monocytes Relative: 3 % (ref 3–12)
NEUTROS PCT: 90 % — AB (ref 43–77)
Neutro Abs: 9 10*3/uL — ABNORMAL HIGH (ref 1.7–7.7)
PLATELETS: 252 10*3/uL (ref 150–400)
RBC: 3.53 MIL/uL — AB (ref 3.87–5.11)
RDW: 13.4 % (ref 11.5–15.5)
WBC: 10 10*3/uL (ref 4.0–10.5)

## 2013-10-08 LAB — GLUCOSE, CAPILLARY
GLUCOSE-CAPILLARY: 194 mg/dL — AB (ref 70–99)
Glucose-Capillary: 177 mg/dL — ABNORMAL HIGH (ref 70–99)
Glucose-Capillary: 248 mg/dL — ABNORMAL HIGH (ref 70–99)
Glucose-Capillary: 233 mg/dL — ABNORMAL HIGH (ref 70–99)

## 2013-10-08 MED ORDER — PREDNISONE 10 MG PO TABS
10.0000 mg | ORAL_TABLET | Freq: Every day | ORAL | Status: DC
  Administered 2013-10-09: 10 mg via ORAL
  Filled 2013-10-08 (×2): qty 1

## 2013-10-08 NOTE — Progress Notes (Signed)
UR complete.  Tamber Burtch RN, MSN 

## 2013-10-08 NOTE — Progress Notes (Signed)
Name: Erica Hansen MRN: [id number] DOB: 03/14/1923    ADMISSION DATE:  10/05/2013 CONSULTATION DATE:  10/08/2013  REFERRING MD :  Candiss Norse PRIMARY SERVICE:  [facility]  CHIEF COMPLAINT:  Shortness of breath, cough   HISTORY OF PRESENT ILLNESS:  [name], never smoker well known to me with IPF , last seen by me in feb 2015. She is admitted now with 'pneumonia '  She was treated with empiric low dose steroids for cough since 06/2008 with good response  PFTs 11/2007 >>FVC 59% - mod restriction with desaturation on exertion.  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 CXR in UC showed worsening infiltrates & hypoxia was worse, hence admitted. Treated with levaquin. She reports increasing pedal edema  SUBJECTIVE: Diuresed well Cough & breathing - improved Concerned about high sugars  VITAL SIGNS: Temp:  [97.6 F (36.4 C)-98.5 F (36.9 C)] 97.8 F (36.6 C) (05/26 0925) Pulse Rate:  [58-85] 65 (05/26 0925) Resp:  [18-22] 18 (05/26 0925) BP: (129-172)/(41-69) 129/51 mmHg (05/26 0925) SpO2:  [81 %-100 %] 89 % (05/26 0954)  PHYSICAL EXAMINATION: Gen. Pleasant, well-nourished, in no distress, normal affect  ENT - no lesions, no post nasal drip  Neck: No JVD, no thyromegaly, no carotid bruits  Lungs: no use of accessory muscles, no dullness to percussion, bL rales 1/2 , no rhonchi  Cardiovascular: Rhythm regular, heart sounds normal, no murmurs or gallops, 1+ peripheral edema  Abdomen: soft and non-tender, no hepatosplenomegaly, BS normal.  Musculoskeletal: No deformities, no cyanosis or clubbing  Neuro: alert, non focal    Recent Labs Lab 10/05/13 1835 10/06/13 0648 10/08/13 0509  NA 137 138 140  K 4.2 4.1 4.6  CL 96 100 99  CO2 _0 BUN 32* 27* 33*  CREATININE 1.06 1.07 0.99  GLUCOSE 210* 139* 206*    Recent Labs Lab 10/06/13 0648 10/07/13 0341 10/08/13 0509  HGB 10.3* 9.6* 10.7*  HCT 32.7* 29.6* 33.2*  WBC 14.7* 12.9* 10.0  PLT 227 221 252   Dg Chest 2 View  10/07/2013   CLINICAL DATA:  Pneumonia, cough and shortness of breath  EXAM: CHEST  2 VIEW  COMPARISON:  Prior chest x-ray 10/05/2013  FINDINGS: Interval progression of patchy interstitial and airspace opacities in the right mid lung. The cardiac and mediastinal contours remain unchanged. Atherosclerotic calcifications are still present in the transverse aorta. No pneumothorax or new effusion. No acute osseous abnormality.  IMPRESSION: Interval progression of multi focal interstitial and airspace opacities in the right mid lung concerning for progression of multifocal pneumonia/pneumonitis.  There may be a slight interval progression of airspace disease in the left suprahilar and left lower lung regions as well.  Underlying chronic lung changes consistent with interstitial fibrosis.   Electronically Signed   By: Jacqulynn Cadet M.D.   On: 10/07/2013 09:09    ASSESSMENT / PLAN:  IPF Worsening infiltrates could represent CAP (-note leucocytosis) or simply an IPF flare -her main symptom has been cough Pedal edema could simply be due to cardizem or represent cor pulmonale , note high BNP  Recommend -  Decrease to10 mg prednisone for IPF flare & cough Lasix 20 mg daily until edema improved  Dc  continuous pulse ox She has clearly expressed DNR wishes & I will reinstate this  Uncontrolled hyperglycemia - did not tolerate metformin in the past Will need other OHA on discharge  D/w pt & daughter  Kara Mead MD. FCCP. [place] [facility] Pager [phone] If no response call [phone]    10/08/2013, 1:45 PM

## 2013-10-08 NOTE — Progress Notes (Signed)
Patient Demographics  Erica Hansen Manigo, is a 80 y.o. female, DOB - [birth year], ION:[id number]  Admit date - 10/05/2013   Admitting Physician Ron ParkerHarvette C Jenkins, MD  Outpatient Primary MD for the patient is Tillman Abideichard Letvak, MD  LOS - 3   Chief Complaint  Patient presents with  . Shortness of Breath           Subjective:   Erica Hansen Deist today has, No headache, No chest pain, No abdominal pain - No Nausea, No new weakness tingling or numbness, improved Cough - SOB.     Assessment & Plan     1. Sepsis due CAP has Chr Resp Failure due to Pulm Fibrosis - improving with Emp Levaquin, vision mild flare of her pulmonary fibrosis, seen by pulmonary physician Dr. Vassie LollAlva, has been placed on low-dose steroid by him, continue, she seems to have much improved today clinically. Leukocytosis is better. Of note she is on 4 L nasal cannula oxygen at home which will be continued, continue as needed nebulizer treatments. Have added Chest PT and Pulm toiletry, increase activity.     2.DM 2 - ISS, on diet control at home  Lab Results  Component Value Date   HGBA1C 6.7* 10/06/2013   CBG (last 3)   Recent Labs  10/07/13 1754 10/07/13 2143 10/08/13 0651  GLUCAP 156* 142* 177*       3.HTN - on Cardizem and Nadolol     4. Chronic respiratory failure due to pulmonary fibrosis -  see #1 above she is on home oxygen 4lit/min      Code Status: Full  Family Communication: daughter [phone] on 5-24,  left message on her phone on 5-26 at 11:45 AM  Disposition Plan: Home likely in 1-2 days.   Procedures     Consults   PCCM   Medications  Scheduled Meds: . albuterol  2.5 mg Nebulization TID  . diltiazem  120 mg Oral Daily  . enoxaparin (LOVENOX) injection  30 mg Subcutaneous Q24H  .  famotidine  20 mg Oral QHS  . furosemide  20 mg Oral Daily  . insulin aspart  0-5 Units Subcutaneous QHS  . insulin aspart  0-9 Units Subcutaneous TID WC  . levofloxacin  250 mg Oral Daily  . mirtazapine  15 mg Oral QHS  . multivitamin  1 tablet Oral q morning - 10a  . nadolol  80 mg Oral Daily  . polyvinyl alcohol  2 drop Both Eyes QHS  . predniSONE  20 mg Oral Q breakfast  . sodium chloride  500 mL Intravenous Once   Continuous Infusions:  PRN Meds:.acetaminophen, acetaminophen, albuterol, alum & mag hydroxide-simeth, HYDROmorphone (DILAUDID) injection, ondansetron (ZOFRAN) IV, ondansetron, oxyCODONE  DVT Prophylaxis  Lovenox    Lab Results  Component Value Date   PLT 252 10/08/2013    Antibiotics     Anti-infectives   Start     Dose/Rate Route Frequency Ordered Stop   10/07/13 1800  levofloxacin (LEVAQUIN) tablet 250 mg     250 mg Oral Daily 10/07/13 1459     10/06/13 1800  levofloxacin (LEVAQUIN) IVPB 500 mg  Status:  Discontinued     500 mg 100 mL/hr over 60 Minutes Intravenous Every 24 hours 10/05/13 2024 10/06/13 1429  10/06/13 1800  levofloxacin (LEVAQUIN) tablet 500 mg  Status:  Discontinued     500 mg Oral Daily 10/06/13 1429 10/07/13 1459   10/05/13 1830  levofloxacin (LEVAQUIN) IVPB 500 mg     500 mg 100 mL/hr over 60 Minutes Intravenous  Once 10/05/13 1810 10/05/13 1952          Objective:   Filed Vitals:   10/08/13 0925 10/08/13 0926 10/08/13 0927 10/08/13 0954  BP: 129/51     Pulse: 65     Temp: 97.8 F (36.6 C)     TempSrc: Oral     Resp: 18     Height:      Weight:      SpO2: 93% 81% 93% 89%    Wt Readings from Last 3 Encounters:  10/05/13 56.9 kg (125 lb 7.1 oz)  10/03/13 55.792 kg (123 lb)  06/28/13 56.7 kg (125 lb)     Intake/Output Summary (Last 24 hours) at 10/08/13 1144 Last data filed at 10/08/13 0900  Gross per 24 hour  Intake    240 ml  Output      0 ml  Net    240 ml     Physical Exam  Awake Alert,  No new F.N  deficits, Normal affect [place].AT,PERRAL Supple Neck,No JVD, No cervical lymphadenopathy appriciated.  Symmetrical Chest wall movement, Good air movement bilaterally, Coarse bilat B sounds few rales RRR,No Gallops,Rubs or new Murmurs, No Parasternal Heave +ve B.Sounds, Abd Soft, No tenderness, No organomegaly appriciated, No rebound - guarding or rigidity. No Cyanosis, Clubbing or edema, No new Rash or bruise      Data Review   Micro Results Recent Results (from the past 240 hour(s))  URINE CULTURE     Status: None   Collection Time    10/05/13  7:05 PM      Result Value Ref Range Status   Specimen Description URINE, CLEAN CATCH   Final   Special Requests ADD [id number] [phone]   Final   Culture  Setup Time     Final   Value: 10/06/2013 03:48     Performed at [facility]   Colony Count     Final   Value: 30,000 COLONIES/ML     Performed at [facility]   Culture     Final   Value: Multiple bacterial morphotypes present, none predominant. Suggest appropriate recollection if clinically indicated.     Performed at [facility]   Report Status 10/07/2013 FINAL   Final    Radiology Reports Dg Chest 2 View  10/05/2013   CLINICAL DATA:  Fever, cough  EXAM: CHEST  2 VIEW  COMPARISON:  05/15/2013  FINDINGS: Cardiomediastinal silhouette is stable. Extensive fibrotic changes and chronic interstitial prominence again noted bilaterally. There is superimposed haziness in left midlung and left lower lobe suspicious for superimposed infiltrate/ pneumonia . Followup examination to assure resolution recommended.  IMPRESSION: Extensive fibrotic changes and chronic interstitial prominence again noted bilaterally. There is superimposed haziness in left midlung and left lower lobe suspicious for superimposed infiltrate/ pneumonia . Followup examination to assure resolution recommended.   Electronically Signed   By: Natasha Mead M.D.   On: 10/05/2013 16:54    CBC  Recent Labs Lab  10/03/13 1530 10/05/13 1835 10/06/13 0648 10/07/13 0341 10/08/13 0509  WBC 16.3* 18.9* 14.7* 12.9* 10.0  HGB 12.6 11.2* 10.3* 9.6* 10.7*  HCT 38.3 33.4* 32.7* 29.6* 33.2*  PLT 291.0 242 227 221 252  MCV 94.4  92.8 95.3 94.6 94.1  MCH  --  31.1 30.0 30.7 30.3  MCHC 32.8 33.5 31.5 32.4 32.2  RDW 13.2 13.5 13.7 13.5 13.4  LYMPHSABS 1.9 1.7  --   --  0.7  MONOABS 0.8 1.3*  --   --  0.3  EOSABS 0.4 0.0  --   --  0.0  BASOSABS 0.1 0.0  --   --  0.0    Chemistries   Recent Labs Lab 10/03/13 1530 10/05/13 1835 10/06/13 0648 10/08/13 0509  NA 137 137 138 140  K 4.4 4.2 4.1 4.6  CL 100 96 100 99  CO2 29 27 26 29   GLUCOSE 240* 210* 139* 206*  BUN 33* 32* 27* 33*  CREATININE 1.2 1.06 1.07 0.99  CALCIUM 9.6 9.0 8.4 9.3  AST 22  --   --   --   ALT 18  --   --   --   ALKPHOS 64  --   --   --   BILITOT 0.5  --   --   --    ------------------------------------------------------------------------------------------------------------------ estimated creatinine clearance is 30.5 ml/min (by C-G formula based on Cr of 0.99). ------------------------------------------------------------------------------------------------------------------  Recent Labs  10/06/13 0648  HGBA1C 6.7*   ------------------------------------------------------------------------------------------------------------------ No results found for this basename: CHOL, HDL, LDLCALC, TRIG, CHOLHDL, LDLDIRECT,  in the last 72 hours ------------------------------------------------------------------------------------------------------------------ No results found for this basename: TSH, T4TOTAL, FREET3, T3FREE, THYROIDAB,  in the last 72 hours ------------------------------------------------------------------------------------------------------------------ No results found for this basename: VITAMINB12, FOLATE, FERRITIN, TIBC, IRON, RETICCTPCT,  in the last 72 hours  Coagulation profile No results found for this basename:  INR, PROTIME,  in the last 168 hours  No results found for this basename: DDIMER,  in the last 72 hours  Cardiac Enzymes No results found for this basename: CK, CKMB, TROPONINI, MYOGLOBIN,  in the last 168 hours ------------------------------------------------------------------------------------------------------------------ No components found with this basename: POCBNP,      Time Spent in minutes   35   Leroy Sea M.D on 10/08/2013 at 11:44 AM  Between 7am to 7pm - Pager - [phone]  After 7pm go to [email or web address] - password [email or web address]  And look for the night coverage person covering for me after hours  [facility] Office  [phone]   **Disclaimer: This note may have been dictated with voice recognition software. Similar sounding words can inadvertently be transcribed and this note may contain transcription errors which may not have been corrected upon publication of note.**

## 2013-10-08 NOTE — Progress Notes (Signed)
Physical Therapy Treatment Patient Details Name: Erica Hansen MRN: [id number] DOB: August 09, 1922 Today's Date: 10/08/2013    History of Present Illness Admitted with Sepsis due CAP has Chr Resp Failure due to Pulm Fibrosis .    PT Comments    *Pt progressing with mobility. SaO2 dropped to 81% on RA with walking, 93% on 4L O2 walking. Use of O2 at home during day is concerning due to fall risk, she's had 2 falls in past year.  HHPT recommended for home safety eval.**  Follow Up Recommendations  Supervision - Intermittent;Home health PT (Pt wears a necklace where she can call for assist if needed )     Equipment Recommendations  None recommended by PT    Recommendations for Other Services       Precautions / Restrictions Precautions Precautions: Fall Precaution Comments: on O2 Restrictions Weight Bearing Restrictions: No    Mobility  Bed Mobility Overal bed mobility: Independent             General bed mobility comments: independent sit to supine  Transfers Overall transfer level: Needs assistance Equipment used: Rolling walker (2 wheeled) Transfers: Sit to/from Stand Sit to Stand: Min guard         General transfer comment: cues for hand placement and to fully back up to bed prior to sitting  Ambulation/Gait Ambulation/Gait assistance: Min guard Ambulation Distance (Feet): 170 Feet (85' x 2, with 2 minute seated rest break) Assistive device: Rolling walker (2 wheeled) Gait Pattern/deviations: Step-through pattern;Decreased stride length   Gait velocity interpretation: Below normal speed for age/gender General Gait Details: Pt states she only uses O2 at night at home. SaO2 81% walking on RA, 93% on 4L O2. Cues for pursed lip breathing, pt tends to inhale thru mouth. Min/guard for safety 2* fall risk. No LOB.   Stairs            Wheelchair Mobility    Modified Rankin (Stroke Patients Only)       Balance Overall balance assessment: History of  Falls (fall in July 2014, and one just prior to admission)                                  Cognition Arousal/Alertness: Awake/alert Behavior During Therapy: WFL for tasks assessed/performed Overall Cognitive Status: Within Functional Limits for tasks assessed                      Exercises      General Comments        Pertinent Vitals/Pain *SaO2 81% RA walking, 93% on 4L walking HR 133 walking, 66 at rest 0/10 pain**    Home Living                      Prior Function            PT Goals (current goals can now be found in the care plan section) Acute Rehab PT Goals Patient Stated Goal: to go home without oxygen during the day PT Goal Formulation: With patient/family Time For Goal Achievement: 10/13/13 Potential to Achieve Goals: Good Progress towards PT goals: Progressing toward goals    Frequency  Min 3X/week    PT Plan Current plan remains appropriate    Co-evaluation             End of Session Equipment Utilized During Treatment: Gait belt;Oxygen (3 liters of oxygen)  Activity Tolerance: Patient tolerated treatment well (Pt SOB after walking. She feels like that is her baseline) Patient left: in bed;with bed alarm set     Time: [phone] PT Time Calculation (min): 30 min  Charges:  $Gait Training: 8-22 mins $Therapeutic Activity: 8-22 mins                    G Codes:      Alvester Morin 10/08/2013, 9:34 AM [phone]

## 2013-10-09 ENCOUNTER — Encounter: Payer: Self-pay | Admitting: Internal Medicine

## 2013-10-09 LAB — CBC
HEMATOCRIT: 32.5 % — AB (ref 36.0–46.0)
Hemoglobin: 10.4 g/dL — ABNORMAL LOW (ref 12.0–15.0)
MCH: 30.1 pg (ref 26.0–34.0)
MCHC: 32 g/dL (ref 30.0–36.0)
MCV: 94.2 fL (ref 78.0–100.0)
Platelets: 279 10*3/uL (ref 150–400)
RBC: 3.45 MIL/uL — AB (ref 3.87–5.11)
RDW: 13.4 % (ref 11.5–15.5)
WBC: 11.5 10*3/uL — AB (ref 4.0–10.5)

## 2013-10-09 LAB — BASIC METABOLIC PANEL
BUN: 40 mg/dL — ABNORMAL HIGH (ref 6–23)
CO2: 31 mEq/L (ref 19–32)
Calcium: 9.4 mg/dL (ref 8.4–10.5)
Chloride: 100 mEq/L (ref 96–112)
Creatinine, Ser: 0.97 mg/dL (ref 0.50–1.10)
GFR calc non Af Amer: 50 mL/min — ABNORMAL LOW (ref >90)
GFR, EST AFRICAN AMERICAN: 58 mL/min — AB (ref >90)
Glucose, Bld: 161 mg/dL — ABNORMAL HIGH (ref 70–99)
Potassium: 4.6 mEq/L (ref 3.7–5.3)
SODIUM: 141 meq/L (ref 137–147)

## 2013-10-09 LAB — GLUCOSE, CAPILLARY
Glucose-Capillary: 149 mg/dL — ABNORMAL HIGH (ref 70–99)
Glucose-Capillary: 152 mg/dL — ABNORMAL HIGH (ref 70–99)

## 2013-10-09 MED ORDER — PREDNISONE 10 MG PO TABS: 10.0000 mg | ORAL_TABLET | Freq: Every day | ORAL | Status: DC

## 2013-10-09 MED ORDER — FUROSEMIDE 20 MG PO TABS: 20.0000 mg | ORAL_TABLET | Freq: Every day | ORAL | Status: DC

## 2013-10-09 MED ORDER — ALBUTEROL SULFATE HFA 108 (90 BASE) MCG/ACT IN AERS: 2.0000 | INHALATION_SPRAY | Freq: Four times a day (QID) | RESPIRATORY_TRACT | Status: DC | PRN

## 2013-10-09 MED ORDER — POTASSIUM CHLORIDE ER 10 MEQ PO TBCR: 10.0000 meq | EXTENDED_RELEASE_TABLET | Freq: Every day | ORAL | Status: DC

## 2013-10-09 MED ORDER — LEVOFLOXACIN 250 MG PO TABS: 250.0000 mg | ORAL_TABLET | Freq: Every day | ORAL | Status: DC

## 2013-10-09 NOTE — Progress Notes (Signed)
Sent page to Dr York Spaniel to order [facility] PT if he agrees appropriate

## 2013-10-09 NOTE — Progress Notes (Signed)
   Name: Erica Hansen MRN: [id number] DOB: 01-06-1923    ADMISSION DATE:  10/05/2013 CONSULTATION DATE:  10/09/2013  REFERRING MD :  Candiss Norse PRIMARY SERVICE:  [facility]  CHIEF COMPLAINT:  Shortness of breath, cough   HISTORY OF PRESENT ILLNESS:  [name], never smoker well known to me with IPF , last seen by me in feb 2015. She is admitted now with 'pneumonia '  She was treated with empiric low dose steroids for cough since 06/2008 with good response  PFTs 11/2007 >>FVC 59% - mod restriction with desaturation on exertion.  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 CXR in UC showed worsening infiltrates & hypoxia was worse, hence admitted. Treated with levaquin. She reports increasing pedal edema  SUBJECTIVE: No complaints, breathing"ok". oob to chair  VITAL SIGNS: Temp:  [94.6 F (34.8 C)-97.8 F (36.6 C)] 94.6 F (34.8 C) (05/27 0506) Pulse Rate:  [60-65] 63 (05/27 0506) Resp:  [18-20] 20 (05/27 0506) BP: (123-156)/(51-70) 156/51 mmHg (05/27 0506) SpO2:  [81 %-100 %] 99 % (05/27 0901)  PHYSICAL EXAMINATION: Gen. Pleasant, well-nourished, in no distress, normal affect on nasal O2 Neck: No JVD, no thyromegaly. Lungs: no use of accessory muscles, diminished in bases  Cardiovascular: Rhythm regular, heart sounds normal, no murmurs or gallops, 1+ peripheral edema  Abdomen: soft and non-tender, no hepatosplenomegaly, BS normal.  Musculoskeletal: No deformities, no cyanosis or clubbing  Neuro: alert, non focal    Recent Labs Lab 10/06/13 0648 10/08/13 0509 10/09/13 0426  NA 138 140 141  K 4.1 4.6 4.6  CL 100 99 100  CO2 _0 BUN 27* 33* 40*  CREATININE 1.07 0.99 0.97  GLUCOSE 139* 206* 161*    Recent Labs Lab 10/07/13 0341  10/08/13 0509 10/09/13 0426  HGB 9.6* 10.7* 10.4*  HCT 29.6* 33.2* 32.5*  WBC 12.9* 10.0 11.5*  PLT 221 252 279   No results found.  Intake/Output Summary (Last 24 hours) at 10/09/13 9179 Last data filed at 10/08/13 1700  Gross per 24 hour  Intake    520 ml  Output      0 ml  Net    520 ml   Filed Weights   10/05/13 1740 10/05/13 2122  Weight: 55.792 kg (123 lb) 56.9 kg (125 lb 7.1 oz)   ASSESSMENT / PLAN:  IPF Worsening infiltrates could represent CAP (-note leucocytosis) or simply an IPF flare -her main symptom has been cough Pedal edema could simply be due to cardizem but favor cor pulmonale , note high BNP  Recommend -  Decrease to 10 mg prednisone for IPF flare & cough, can drop to 10m on 5/29 Lasix 20 mg daily until edema improved (note BUN rising) Daily wts. DNR   Uncontrolled hyperglycemia - did not tolerate metformin in the past Doubt need for OHA on discharge, see Diabetic coordinator recommendations.  Discussed with pt & daughter, she has out pt FU in 2 wks  RKara MeadMD. FShade Flood [facility] Pager [phone] no response call [phone]   10/09/2013, 9:23 AM

## 2013-10-09 NOTE — Progress Notes (Signed)
Inpatient Diabetes Program Recommendations  AACE/ADA: New Consensus Statement on Inpatient Glycemic Control (2013)  Target Ranges:  Prepandial:   less than 140 mg/dL      Peak postprandial:   less than 180 mg/dL (1-2 hours)      Critically ill patients:  140 - 180 mg/dL   Results for SYNIA, GUARENTE (MRN [id number]) as of 10/09/2013 09:05  Ref. Range 10/08/2013 06:51 10/08/2013 12:03 10/08/2013 16:46 10/08/2013 23:02 10/09/2013 07:44  Glucose-Capillary Latest Range: 70-99 mg/dL 144 (H) 315 (H) 400 (H) 194 (H) 149 (H)   Diabetes history: DM2 Outpatient Diabetes medications: None Current orders for Inpatient glycemic control: Novolog 0-9 units AC, Novolog 0-5 units HS  Inpatient Diabetes Program Recommendations Insulin - Meal Coverage: While inpatient and on steroids, please consider ordering Novolog 3 units TID with meals for meal coverage (in addition to Novolog correction).  Thanks, Orlando Penner, RN, MSN, CCRN Diabetes Coordinator Inpatient Diabetes Program [phone] (Team Pager) [phone] ([facility] office) [phone] [facility] office)

## 2013-10-09 NOTE — Discharge Summary (Signed)
Physician Discharge Summary  Erica Hansen ZOX:[id number] DOB: [birth year] DOA: 10/05/2013  PCP: Tillman Abide, MD  Admit date: 10/05/2013 Discharge date: 10/09/2013  Time spent: >35 minutes  Recommendations for Outpatient Follow-up:  SNF per family for 1 week  F/u with PCP in 1-2 week s F/u with pulmonologist as scheduled  Discharge Diagnoses:  Principal Problem:   Sepsis Active Problems:   DIABETES MELLITUS, TYPE II, WITH RENAL COMPLICATIONS   HYPERTENSION   PULMONARY FIBROSIS, CHRONIC   Chronic respiratory failure   Chronic kidney disease, stage III (moderate)   CAP (community acquired pneumonia)   Leukocytosis   Acute-on-chronic respiratory failure   Discharge Condition: stable   Diet recommendation: low sodium   Filed Weights   10/05/13 1740 10/05/13 2122  Weight: 55.792 kg (123 lb) 56.9 kg (125 lb 7.1 oz)    History of present illness:  80 y/o female with IPF presented with SOB, hypoxia found to have pneumonia   Hospital Course:  1. Pneumonia/sepsis, chronic respiratory failure with IPF -clinically improving, afebrile, leukocytosis improved; changed atx to PO, cont steroids 10 mg, prn albuterol; lasix for 4 days PO; avoid over diuresis in elderly patient; no clinical s/s of acute CHF; f/u with OP echo  -d/w patient, her daughter; they wanted to go home today; recommended to f/u with PCP in 1 week; pulmonology has scheduled appointment   2. DM HA1C-6.7; likely will improve when off steroids shortly  -cont diet control, avoid medications if possible in elderly patient due to risk of hypoglycemia; if persistent may try short acting glipizide OP   3.HTN - on Cardizem and Nadolol   4. Chronic respiratory failure due to pulmonary fibrosis - see #1 above she is on home oxygen 4lit/min    Procedures:   (i.e. Studies not automatically included, echos, thoracentesis, etc; not x-rays)  Consultations:  Pulmonology   Discharge Exam: Filed Vitals:   10/09/13 0506   BP: 156/51  Pulse: 63  Temp: 94.6 F (34.8 C)  Resp: 20    General: alert Cardiovascular: s1,s2 rrr Respiratory: fine chronic lung crackles   Discharge Instructions  Discharge Instructions   Diet - low sodium heart healthy    Complete by:  As directed      Discharge instructions    Complete by:  As directed   Please follow up with primary care doctor in 1-2 week s     Increase activity slowly    Complete by:  As directed             Medication List         albuterol 108 (90 BASE) MCG/ACT inhaler  Commonly known as:  PROVENTIL HFA;VENTOLIN HFA  Inhale 2 puffs into the lungs every 6 (six) hours as needed for wheezing or shortness of breath.     aspirin 81 MG tablet  Take 81 mg by mouth daily.     CENTRUM SILVER PO  Take 1 capsule by mouth daily.     diltiazem 120 MG 24 hr capsule  Commonly known as:  CARDIZEM CD  Take 1 capsule (120 mg total) by mouth daily.     famotidine 20 MG tablet  Commonly known as:  PEPCID  Take 1 tablet (20 mg total) by mouth at bedtime.     furosemide 20 MG tablet  Commonly known as:  LASIX  Take 1 tablet (20 mg total) by mouth daily.     glucose blood test strip  Commonly known as:  TRUETEST TEST  Use  to test blood sugar once daily dx: 250.00     levofloxacin 250 MG tablet  Commonly known as:  LEVAQUIN  Take 1 tablet (250 mg total) by mouth daily.     mirtazapine 15 MG tablet  Commonly known as:  REMERON  Take 1 tablet (15 mg total) by mouth at bedtime.     nadolol 80 MG tablet  Commonly known as:  CORGARD  Take 1 tablet (80 mg total) by mouth daily.     olmesartan-hydrochlorothiazide 40-12.5 MG per tablet  Commonly known as:  BENICAR HCT  Take 1 tablet by mouth daily.     potassium chloride 10 MEQ tablet  Commonly known as:  K-DUR  Take 1 tablet (10 mEq total) by mouth daily.     predniSONE 10 MG tablet  Commonly known as:  DELTASONE  Take 1 tablet (10 mg total) by mouth daily with breakfast.     SYSTANE 0.4-0.3  % Soln  Generic drug:  Polyethyl Glycol-Propyl Glycol  Place 2 drops into both eyes at bedtime.     TYLENOL ARTHRITIS PAIN PO  Take 1 tablet by mouth 2 (two) times daily as needed.       Allergies  Allergen Reactions  . Clindamycin     REACTION: diffuse maculopapular rash  . Delsym [Dextromethorphan]     Diarrhea  . Lisinopril     REACTION: cough  . Penicillins   . Sulfonamide Derivatives        Follow-up Information   Follow up with Oretha MilchALVA,RAKESH V., MD In 2 weeks.   Specialty:  Pulmonary Disease   Contact information:   [address] [place] [address] [phone]        The results of significant diagnostics from this hospitalization (including imaging, microbiology, ancillary and laboratory) are listed below for reference.    Significant Diagnostic Studies: Dg Chest 2 View  10/07/2013   CLINICAL DATA:  Pneumonia, cough and shortness of breath  EXAM: CHEST  2 VIEW  COMPARISON:  Prior chest x-ray 10/05/2013  FINDINGS: Interval progression of patchy interstitial and airspace opacities in the right mid lung. The cardiac and mediastinal contours remain unchanged. Atherosclerotic calcifications are still present in the transverse aorta. No pneumothorax or new effusion. No acute osseous abnormality.  IMPRESSION: Interval progression of multi focal interstitial and airspace opacities in the right mid lung concerning for progression of multifocal pneumonia/pneumonitis.  There may be a slight interval progression of airspace disease in the left suprahilar and left lower lung regions as well.  Underlying chronic lung changes consistent with interstitial fibrosis.   Electronically Signed   By: Malachy MoanHeath  McCullough M.D.   On: 10/07/2013 09:09   Dg Chest 2 View  10/05/2013   CLINICAL DATA:  Fever, cough  EXAM: CHEST  2 VIEW  COMPARISON:  05/15/2013  FINDINGS: Cardiomediastinal silhouette is stable. Extensive fibrotic changes and chronic interstitial prominence again noted bilaterally.  There is superimposed haziness in left midlung and left lower lobe suspicious for superimposed infiltrate/ pneumonia . Followup examination to assure resolution recommended.  IMPRESSION: Extensive fibrotic changes and chronic interstitial prominence again noted bilaterally. There is superimposed haziness in left midlung and left lower lobe suspicious for superimposed infiltrate/ pneumonia . Followup examination to assure resolution recommended.   Electronically Signed   By: Natasha MeadLiviu  Pop M.D.   On: 10/05/2013 16:54    Microbiology: Recent Results (from the past 240 hour(s))  URINE CULTURE     Status: None   Collection Time  10/05/13  7:05 PM      Result Value Ref Range Status   Specimen Description URINE, CLEAN CATCH   Final   Special Requests ADD [id number]   Final   Culture  Setup Time     Final   Value: 10/06/2013 03:48     Performed at [facility]   Colony Count     Final   Value: 30,000 COLONIES/ML     Performed at [facility]   Culture     Final   Value: Multiple bacterial morphotypes present, none predominant. Suggest appropriate recollection if clinically indicated.     Performed at [facility]   Report Status 10/07/2013 FINAL   Final     Labs: Basic Metabolic Panel:  Recent Labs Lab 10/03/13 1530 10/05/13 1835 10/06/13 0648 10/08/13 0509 10/09/13 0426  NA 137 137 138 140 141  K 4.4 4.2 4.1 4.6 4.6  CL 100 96 100 99 100  CO2 29 27 26 29 31   GLUCOSE 240* 210* 139* 206* 161*  BUN 33* 32* 27* 33* 40*  CREATININE 1.2 1.06 1.07 0.99 0.97  CALCIUM 9.6 9.0 8.4 9.3 9.4   Liver Function Tests:  Recent Labs Lab 10/03/13 1530  AST 22  ALT 18  ALKPHOS 64  BILITOT 0.5  PROT 7.5  ALBUMIN 3.5   No results found for this basename: LIPASE, AMYLASE,  in the last 168 hours No results found for this basename: AMMONIA,  in the last 168 hours CBC:  Recent Labs Lab 10/03/13 1530 10/05/13 1835 10/06/13 0648 10/07/13 0341 10/08/13 0509  10/09/13 0426  WBC 16.3* 18.9* 14.7* 12.9* 10.0 11.5*  NEUTROABS 13.1* 15.9*  --   --  9.0*  --   HGB 12.6 11.2* 10.3* 9.6* 10.7* 10.4*  HCT 38.3 33.4* 32.7* 29.6* 33.2* 32.5*  MCV 94.4 92.8 95.3 94.6 94.1 94.2  PLT 291.0 242 227 221 252 279   Cardiac Enzymes: No results found for this basename: CKTOTAL, CKMB, CKMBINDEX, TROPONINI,  in the last 168 hours BNP: BNP (last 3 results)  Recent Labs  10/07/13 1958  PROBNP 8423.0*   CBG:  Recent Labs Lab 10/08/13 0651 10/08/13 1203 10/08/13 1646 10/08/13 2302 10/09/13 0744  GLUCAP 177* 248* 233* 194* 149*       Signed:  Isaiah Serge Ayasha Ellingsen  [facility] 10/09/2013, 10:15 AM

## 2013-10-09 NOTE — Care Management Note (Signed)
    Page 1 of 1   10/09/2013     2:29:28 PM CARE MANAGEMENT NOTE 10/09/2013  Patient:  Erica Hansen, Erica Hansen   Account Number:  [id number]  Date Initiated:  10/08/2013  Documentation initiated by:  Lorne Skeens  Subjective/Objective Assessment:   Patient admitted with CAP. Lives at home with kids.  Home O2 dependent prior to admission     Action/Plan:   Will follow for discharge needs pending PT/OT evals and physician orders.   Anticipated DC Date:  10/09/2013   Anticipated DC Plan:  [place]  CM consult      Choice offered to / List presented to:  C-4 Adult Children        HH arranged  HH-2 PT      [place].   Status of service:  Completed, signed off Medicare Important Message given?  YES (If response is "NO", the following Medicare IM given date fields will be blank) Date Medicare IM given:  10/09/2013 Date Additional Medicare IM given:  10/09/2013  Discharge Disposition:  [place]  Per UR Regulation:  Reviewed for med. necessity/level of care/duration of stay  If discussed at [place] of Stay Meetings, dates discussed:    Comments:  10/09/13 [place], MSN, CM- Met with patient and daughter to discuss home health needs.  Patient is agreeable to HHPT. She and daughter have chosen to use [facility], which they have used in the past.  Tiyonna with [facility] was notified of referral for discharge home today. Medicare IM was provided by Admitting today prior to CM speaking with patient.  If [facility] will be contacting patient today, please call daughter Erica Hansen [phone].  If contacting patient tomorrow, please call son-in-law Erica Hansen at [phone]

## 2013-10-14 ENCOUNTER — Encounter: Payer: Self-pay | Admitting: Internal Medicine

## 2013-10-18 DIAGNOSIS — E119 Type 2 diabetes mellitus without complications: Secondary | ICD-10-CM

## 2013-10-18 DIAGNOSIS — J8410 Pulmonary fibrosis, unspecified: Secondary | ICD-10-CM

## 2013-10-18 DIAGNOSIS — J189 Pneumonia, unspecified organism: Secondary | ICD-10-CM

## 2013-10-18 DIAGNOSIS — IMO0001 Reserved for inherently not codable concepts without codable children: Secondary | ICD-10-CM

## 2013-10-31 ENCOUNTER — Encounter: Payer: Self-pay | Admitting: Adult Health

## 2013-10-31 ENCOUNTER — Ambulatory Visit (INDEPENDENT_AMBULATORY_CARE_PROVIDER_SITE_OTHER): Payer: Medicare PPO | Admitting: Adult Health

## 2013-10-31 ENCOUNTER — Ambulatory Visit (INDEPENDENT_AMBULATORY_CARE_PROVIDER_SITE_OTHER)
Admission: RE | Admit: 2013-10-31 | Discharge: 2013-10-31 | Disposition: A | Discharge status: Home / Self Care | Payer: Medicare PPO | Source: Ambulatory Visit | Attending: Adult Health | Admitting: Adult Health

## 2013-10-31 VITALS — BP 136/68 | HR 73 | Temp 97.8°F | Ht 60.0 in | Wt 124.0 lb

## 2013-10-31 DIAGNOSIS — J189 Pneumonia, unspecified organism: Secondary | ICD-10-CM

## 2013-10-31 DIAGNOSIS — J9621 Acute and chronic respiratory failure with hypoxia: Secondary | ICD-10-CM

## 2013-10-31 DIAGNOSIS — J8410 Pulmonary fibrosis, unspecified: Secondary | ICD-10-CM

## 2013-10-31 DIAGNOSIS — I509 Heart failure, unspecified: Secondary | ICD-10-CM

## 2013-10-31 DIAGNOSIS — I5031 Acute diastolic (congestive) heart failure: Secondary | ICD-10-CM

## 2013-10-31 DIAGNOSIS — J9620 Acute and chronic respiratory failure, unspecified whether with hypoxia or hypercapnia: Secondary | ICD-10-CM

## 2013-10-31 DIAGNOSIS — R0902 Hypoxemia: Secondary | ICD-10-CM

## 2013-10-31 MED ORDER — FLUTTER DEVI: Status: DC

## 2013-10-31 NOTE — Assessment & Plan Note (Signed)
Taper to off steroids  Repeat cxr today  Cont on O2

## 2013-10-31 NOTE — Assessment & Plan Note (Signed)
CAP recent admit, clinically improving  Check cxr today  No further abx  follow up 4 weeks and As needed

## 2013-10-31 NOTE — Addendum Note (Signed)
Addended by: JONES, JESSICA E on: 10/31/2013 05:13 PM   Modules accepted: Orders  

## 2013-10-31 NOTE — Progress Notes (Signed)
Reviewed & agree with plan  

## 2013-10-31 NOTE — Progress Notes (Signed)
   Subjective:    Patient ID: Erica Hansen, female    DOB: 19-Mar-1923, 80 y.o.   MRN: [id number]  HPI  [name], never smoker for FU of IPF  She was treated with empiric steroids for cough since 2/10 with good response   PFTs 7/09 >>FVC 59% - mod restriction with desaturation on exertion.  ANA neg, RA factor neg - esr 71  Pneumovax 2003  11/09 CT chest >> pattern of fibrosis is most consistent with usual interstitial pneumonia (UIP). Increase in superimposed ground-glass suggests areas of active  inflammation.   She was qualified for 02 but has resisted it -finally started on O2  03/2013 -She is frustrated but compliant - 03/19/2013 cxr suggest worse alveolitis > Given short course of pred & pepcid for GERD   06/28/13 74m FU -on round the clock tessalon  Breathing is unchanged since last OV.  Walkiing with 4 l/m continues sats >90%. AT rest in chair sats >90 on RA.  Cough is better off prednisone  Feels DOE about the same no different.  No chest pain , orthopnea, edema or fever.   10/31/2013 Follow up [facility] follow up  Patient returns for a four-month followup for pulmonary fibrosis. She remains on oxygen therapy at 2l/m .  Patient has recently been hospitalized. May 23rd -May 27th for a community-acquired pneumonia complicated by sepsis and acute on chronic respiratory failure. Patient was treated aggressively with IV antibiotics, steroids,  and pulmonary hygiene regimen. Patient did receive, diuresis, with an elevated BNP at 8000. Patient was recommended to have a 2-D echo in the outpatient setting. She is feeling better but is still weak Complains of constipation. No abd pain or n/v.d.    Past Medical History  Diagnosis Date  . Diverticulosis   . Unspecified essential hypertension   . Osteoarthritis   . Type II or unspecified type diabetes mellitus without mention of complication, not stated as uncontrolled   . Anxiety states   . Lung fibrosis   . Urinary incontinence       Review of Systems   neg for any significant sore throat, dysphagia, itching, sneezing, nasal congestion or excess/ purulent secretions, fever, chills, sweats, unintended wt loss, pleuritic or exertional cp, hempoptysis, orthopnea pnd or change in chronic leg swelling. Also denies presyncope, palpitations, heartburn, abdominal pain, nausea, vomiting, diarrhea or change in bowel or urinary habits, dysuria,hematuria, rash, arthralgias, visual complaints, headache, numbness weakness or ataxia.     Objective:   Physical Exam   Gen. Pleasant, elderly  in no distress, normal affect ENT - no lesions, no post nasal drip Neck: No JVD, no thyromegaly, no carotid bruits Lungs: no use of accessory muscles, no dullness to percussion, bibasal rales , no rhonchi  Cardiovascular: Rhythm regular, heart sounds  normal, no murmurs or gallops, 1+ peripheral edema Abdomen: soft and non-tender, no hepatosplenomegaly, BS normal. Musculoskeletal: No deformities, no cyanosis or clubbing Neuro:  alert, non focal       Assessment & Plan:

## 2013-10-31 NOTE — Patient Instructions (Addendum)
Take prednisone 5mg  every other day for 1 week then stop.  Chest xray today  May use Flutter valve Three times a day  For thick mucus /congestion.  We are setting you up for a 2 D echo  Begin Stool Softner daily  Can use Miralax 1 capful At bedtime  As needed  Constipation  follow up Dr. Vassie LollAlva  In 4-6 weeks and As needed   Please contact office for sooner follow up if symptoms do not improve or worsen or seek emergency care

## 2013-10-31 NOTE — Assessment & Plan Note (Signed)
Recent admission with fluid overload with elevated BNP  Plan  Check BNP

## 2013-11-01 ENCOUNTER — Encounter: Payer: Self-pay | Admitting: Adult Health

## 2013-11-04 MED ORDER — PREDNISONE 5 MG PO TABS: ORAL_TABLET | ORAL | Status: DC

## 2013-11-08 ENCOUNTER — Ambulatory Visit (HOSPITAL_COMMUNITY): Payer: Medicare PPO | Attending: Internal Medicine | Admitting: Radiology

## 2013-11-08 DIAGNOSIS — I059 Rheumatic mitral valve disease, unspecified: Secondary | ICD-10-CM | POA: Insufficient documentation

## 2013-11-08 DIAGNOSIS — I079 Rheumatic tricuspid valve disease, unspecified: Secondary | ICD-10-CM | POA: Insufficient documentation

## 2013-11-08 DIAGNOSIS — I509 Heart failure, unspecified: Secondary | ICD-10-CM | POA: Insufficient documentation

## 2013-11-08 DIAGNOSIS — I5031 Acute diastolic (congestive) heart failure: Secondary | ICD-10-CM | POA: Insufficient documentation

## 2013-11-08 NOTE — Progress Notes (Signed)
Echocardiogram performed.  

## 2013-11-13 ENCOUNTER — Encounter: Payer: Self-pay | Admitting: Adult Health

## 2013-11-13 NOTE — Telephone Encounter (Signed)
Dr Vassie LollAlva please advise on patient e-mail.  Thanks.

## 2013-11-14 NOTE — Progress Notes (Signed)
Quick Note:  Called spoke with patient, advised of 2D Echo results / recs as stated by TP. Pt verbalized her understanding and denied any questions. ______

## 2013-11-20 ENCOUNTER — Encounter: Payer: Self-pay | Admitting: Internal Medicine

## 2013-11-20 MED ORDER — DILTIAZEM HCL ER COATED BEADS 120 MG PO CP24: 120.0000 mg | ORAL_CAPSULE | Freq: Every day | ORAL | Status: DC

## 2013-11-21 MED ORDER — MIRTAZAPINE 15 MG PO TABS: 15.0000 mg | ORAL_TABLET | Freq: Every day | ORAL | Status: DC

## 2013-11-22 ENCOUNTER — Other Ambulatory Visit: Payer: Self-pay | Admitting: Internal Medicine

## 2013-12-19 ENCOUNTER — Ambulatory Visit (INDEPENDENT_AMBULATORY_CARE_PROVIDER_SITE_OTHER): Payer: Medicare PPO | Admitting: Pulmonary Disease

## 2013-12-19 ENCOUNTER — Encounter: Payer: Self-pay | Admitting: Pulmonary Disease

## 2013-12-19 VITALS — BP 152/64 | HR 70 | Temp 97.8°F | Ht 60.0 in | Wt 120.6 lb

## 2013-12-19 DIAGNOSIS — R0902 Hypoxemia: Secondary | ICD-10-CM

## 2013-12-19 DIAGNOSIS — J9611 Chronic respiratory failure with hypoxia: Secondary | ICD-10-CM

## 2013-12-19 DIAGNOSIS — J8410 Pulmonary fibrosis, unspecified: Secondary | ICD-10-CM

## 2013-12-19 DIAGNOSIS — J9610 Chronic respiratory failure, unspecified whether with hypoxia or hypercapnia: Secondary | ICD-10-CM

## 2013-12-19 MED ORDER — BENZONATATE 200 MG PO CAPS: 200.0000 mg | ORAL_CAPSULE | Freq: Four times a day (QID) | ORAL | Status: DC | PRN

## 2013-12-19 NOTE — Patient Instructions (Signed)
Benzonatate 200 mg 4 times/d as needed Call me if cough worse & we may have to restart prednisone We discussed portable concentrator Weigh yourself 3 times/ week - may have to restart lasix if swelling or weight gain.

## 2013-12-19 NOTE — Progress Notes (Signed)
   Subjective:    Patient ID: Erica Hansen, female    DOB: May 07, 1923, 80 y.o.   MRN: [id number]  HPI 89/F, never smoker for FU of IPF , on 24h  O2 since 03/2013  She was treated with empiric steroids for cough since 2/10 with good response   Significant tests/ events  PFTs 11/2007 >>FVC 59% - mod restriction with desaturation on exertion.  ANA neg, RA factor neg - esr 71  Pneumovax 2003  03/2008 CT chest >> pattern of fibrosis is most consistent with usual interstitial pneumonia (UIP). Increase in superimposed ground-glass suggests areas of active  inflammation.   - 03/19/2013 cxr suggest worse alveolitis > Given short course of pred & pepcid for GERD  06/28/13 49mFU  -on round the clock tessalon  Breathing is unchanged since last OV.  Walkiing with 4 l/m continues sats >90%. AT rest in chair sats >90 on RA.     12/19/2013  Chief Complaint  Patient presents with  . Follow-up    pt uses 2 liters O2 24/7. c/o prod cough last night-white-brown phlem. Denies any wheezing, no chest tx.    Cough is better off prednisone  Feels DOE about the same no different.  No chest pain , orthopnea, edema or fever.   She remains on oxygen therapy at 2l/m .  She was hospitalized. May 23rd -May 27th for a community-acquired pneumonia complicated by sepsis and acute on chronic respiratory failure.  She diuresed well, with an elevated BNP at 8000, she had frequent urination with 20 mg  Echo - nml LV fn, mild AS   Review of Systems neg for any significant sore throat, dysphagia, itching, sneezing, nasal congestion or excess/ purulent secretions, fever, chills, sweats, unintended wt loss, pleuritic or exertional cp, hempoptysis, orthopnea pnd or change in chronic leg swelling. Also denies presyncope, palpitations, heartburn, abdominal pain, nausea, vomiting, diarrhea or change in bowel or urinary habits, dysuria,hematuria, rash, arthralgias, visual complaints, headache, numbness weakness or  ataxia.     Objective:   Physical Exam        Assessment & Plan:

## 2013-12-20 NOTE — Assessment & Plan Note (Signed)
Okay to come off oxygen at rest occasionally Weigh yourself 3 times/ week - may have to restart lasix if swelling or weight gain.

## 2013-12-20 NOTE — Assessment & Plan Note (Signed)
Benzonatate 200 mg 4 times/d as needed Call me if cough worse & we may have to restart prednisone We discussed portable concentrator

## 2014-01-03 ENCOUNTER — Other Ambulatory Visit: Payer: Self-pay | Admitting: Pulmonary Disease

## 2014-01-03 NOTE — Telephone Encounter (Signed)
Ok to refill 

## 2014-01-03 NOTE — Telephone Encounter (Signed)
Please advise Dr. Vassie LollAlva if okay to refill tess pearls. thanks

## 2014-02-05 ENCOUNTER — Telehealth: Payer: Self-pay | Admitting: Pulmonary Disease

## 2014-02-05 MED ORDER — FUROSEMIDE 20 MG PO TABS: 20.0000 mg | ORAL_TABLET | Freq: Every day | ORAL | Status: DC

## 2014-02-05 NOTE — Telephone Encounter (Signed)
Spoke with daughter Olegario Messier. She reports pt has swelling in left leg. Wants lasix called in. It does not seem to be seeping, red, warm to touch. Please advise RA thanks  Allergies  Allergen Reactions  . Clindamycin     REACTION: diffuse maculopapular rash  . Delsym [Dextromethorphan]     Diarrhea  . Lisinopril     REACTION: cough  . Penicillins   . Sulfonamide Derivatives

## 2014-02-05 NOTE — Telephone Encounter (Signed)
Spoke w/ Olegario Messier. Aware of recs. RX sent in. appt scheduled to see TP

## 2014-02-05 NOTE — Telephone Encounter (Signed)
Lasix 20 mg daily x 2 weeks Call back in 5 days to report Schedule OV with TP in 1-2 wks

## 2014-02-17 ENCOUNTER — Ambulatory Visit (INDEPENDENT_AMBULATORY_CARE_PROVIDER_SITE_OTHER): Payer: Medicare PPO | Admitting: Adult Health

## 2014-02-17 ENCOUNTER — Other Ambulatory Visit (INDEPENDENT_AMBULATORY_CARE_PROVIDER_SITE_OTHER): Payer: Medicare PPO

## 2014-02-17 ENCOUNTER — Encounter: Payer: Self-pay | Admitting: Adult Health

## 2014-02-17 VITALS — BP 136/62 | HR 64 | Temp 98.2°F | Wt 115.0 lb

## 2014-02-17 DIAGNOSIS — J9611 Chronic respiratory failure with hypoxia: Secondary | ICD-10-CM

## 2014-02-17 DIAGNOSIS — R0600 Dyspnea, unspecified: Secondary | ICD-10-CM

## 2014-02-17 DIAGNOSIS — J8410 Pulmonary fibrosis, unspecified: Secondary | ICD-10-CM

## 2014-02-17 LAB — BASIC METABOLIC PANEL
BUN: 43 mg/dL — ABNORMAL HIGH (ref 6–23)
CHLORIDE: 91 meq/L — AB (ref 96–112)
CO2: 33 meq/L — AB (ref 19–32)
Calcium: 9.6 mg/dL (ref 8.4–10.5)
Creatinine, Ser: 1.3 mg/dL — ABNORMAL HIGH (ref 0.4–1.2)
GFR: 39.89 mL/min — ABNORMAL LOW (ref >60.00)
Glucose, Bld: 181 mg/dL — ABNORMAL HIGH (ref 70–99)
Potassium: 4.4 mEq/L (ref 3.5–5.1)
Sodium: 133 mEq/L — ABNORMAL LOW (ref 135–145)

## 2014-02-17 LAB — BRAIN NATRIURETIC PEPTIDE: Pro B Natriuretic peptide (BNP): 194 pg/mL — ABNORMAL HIGH (ref 0.0–100.0)

## 2014-02-17 MED ORDER — BENZONATATE 200 MG PO CAPS: 200.0000 mg | ORAL_CAPSULE | Freq: Three times a day (TID) | ORAL | Status: DC | PRN

## 2014-02-17 NOTE — Assessment & Plan Note (Addendum)
Compensated on O2.  Recent flare with mild fluid overload -improved with diuresis  Check labs today  .  Plan  May stop Lasix today .  Labs today .  Low salt diet  Wear Oxygen 2l/m continuous flow.  Follow up Dr. Vassie LollAlva  In 2 months  Please contact office for sooner follow up if symptoms do not improve or worsen or seek emergency care

## 2014-02-17 NOTE — Patient Instructions (Addendum)
May use Tessalon 200mg  Three times a day  As needed cough.  May stop Lasix today .  Labs today .  Low salt diet  Wear Oxygen 2l/m continuous flow.  Follow up Dr. Vassie LollAlva  In 2 months  Please contact office for sooner follow up if symptoms do not improve or worsen or seek emergency care

## 2014-02-17 NOTE — Progress Notes (Signed)
Subjective:    Patient ID: Erica Hansen, female    DOB: [birth year], 80 y.o.   MRN: [id number]  HPI 89/F, never smoker for FU of IPF  She was treated with empiric steroids for cough since 2/10 with good response   PFTs 7/09 >>FVC 59% - mod restriction with desaturation on exertion.  ANA neg, RA factor neg - esr 71  Pneumovax 2003  11/09 CT chest >> pattern of fibrosis is most consistent with usual interstitial pneumonia (UIP). Increase in superimposed ground-glass suggests areas of active  inflammation.   She was qualified for 02 but has resisted it -finally started on O2  03/2013 -She is frustrated but compliant - 03/19/2013 cxr suggest worse alveolitis > Given short course of pred & pepcid for GERD   06/28/13 55mFU -on round the clock tessalon  Breathing is unchanged since last OV.  Walkiing with 4 l/m continues sats >90%. AT rest in chair sats >90 on RA.  Cough is better off prednisone  Feels DOE about the same no different.  No chest pain , orthopnea, edema or fever.   [facility]  [facility] returns for a four-month followup for pulmonary fibrosis. She remains on oxygen therapy at 2l/m .  Patient has recently been hospitalized. May 23rd -May 27th for a community-acquired pneumonia complicated by sepsis and acute on chronic respiratory failure. Patient was treated aggressively with IV antibiotics, steroids,  and pulmonary hygiene regimen. Patient did receive, diuresis, with an elevated BNP at 8000. Patient was recommended to have a 2-D echo in the outpatient setting. She is feeling better but is still weak Complains of constipation. No abd pain or n/v.d.   12/19/13  Cough is better off prednisone  Feels DOE about the same no different.  No chest pain , orthopnea, edema or fever.    02/17/2014 Follow up Hx never smoker for FU of IPF  Returns for 2 week follow up. Noticed couple of weeks ago ankle swollen with slightly more DOE. Started on Lasix  262mdaily x 2 weeks. Legs swelling is better , wt down 5 lbs. Breathing back to baseline . She is on Benicar HCT daily . Cough remains at baseline without flare -has been off prednisone for 2 months .  No fever, chest pain, orthopnea, n/v/d, discolored mucus.    Past Medical History  Diagnosis Date  . Diverticulosis   . Unspecified essential hypertension   . Osteoarthritis   . Type II or unspecified type diabetes mellitus without mention of complication, not stated as uncontrolled   . Anxiety states   . Lung fibrosis   . Urinary incontinence      Review of Systems   neg for any significant sore throat, dysphagia, itching, sneezing, nasal congestion or excess/ purulent secretions, fever, chills, sweats, unintended wt loss, pleuritic or exertional cp, hempoptysis, orthopnea pnd or change in chronic leg swelling. Also denies presyncope, palpitations, heartburn, abdominal pain, nausea, vomiting, diarrhea or change in bowel or urinary habits, dysuria,hematuria, rash, arthralgias, visual complaints, headache, numbness weakness or ataxia.     Objective:   Physical Exam   Gen. Pleasant, elderly  in no distress, normal affect ENT - no lesions, no post nasal drip Neck: No JVD, no thyromegaly, no carotid bruits Lungs: no use of accessory muscles, no dullness to percussion, bibasal rales , no rhonchi  Cardiovascular: Rhythm regular, heart sounds  normal, no murmurs or gallops, tr+ peripheral edema Abdomen: soft and non-tender, no hepatosplenomegaly, BS  normal. Musculoskeletal: No deformities, no cyanosis or clubbing Neuro:  alert, non focal       Assessment & Plan:

## 2014-02-17 NOTE — Assessment & Plan Note (Addendum)
Compensated on present regimen without flare  Now off steroids  Sats adequate on 2l/m   Plan  May use Tessalon 200mg  Three times a day  As needed cough.  Wear Oxygen 2l/m continuous flow.  Follow up Dr. Vassie LollAlva  In 2 months  Please contact office for sooner follow up if symptoms do not improve or worsen or seek emergency care

## 2014-02-21 NOTE — Progress Notes (Signed)
Reviewed & agree with plan  

## 2014-02-26 ENCOUNTER — Telehealth: Payer: Self-pay | Admitting: Pulmonary Disease

## 2014-02-26 ENCOUNTER — Encounter: Payer: Self-pay | Admitting: Adult Health

## 2014-02-26 NOTE — Telephone Encounter (Signed)
Lasix 20 mg qd started on 02/05/14.  Pt stopped taking med per recs from OV with TP on 02/17/14.  Spoke with pt who reports edema in bilateral feet.  This started last night/this morning.  Pt denies edema in ankles/legs, redness or warmth to extremeties, increased SOB, or increased cough.  She is wearing support hose and has kept feet elevated today.  Pt unsure if this has helped edema. Pt wasn't able to give a recent wt.  Would like to know if she should restart lasix.  Pt aware TP or RA are not in office today and would like to wait until tomorrow morning for recs from either of them.  Tammy, please advise.  Thank you.

## 2014-02-28 NOTE — Telephone Encounter (Signed)
Called pt and LMTCB x1 

## 2014-02-28 NOTE — Telephone Encounter (Signed)
Ok to resume one lasix daily prn excess swelling

## 2014-02-28 NOTE — Telephone Encounter (Signed)
Msg was not addressed and now TP is off  Will forward to doc of the day MW, please advise thanks

## 2014-03-03 ENCOUNTER — Other Ambulatory Visit (INDEPENDENT_AMBULATORY_CARE_PROVIDER_SITE_OTHER): Payer: Medicare PPO

## 2014-03-03 ENCOUNTER — Other Ambulatory Visit: Payer: Self-pay | Admitting: Adult Health

## 2014-03-03 DIAGNOSIS — I10 Essential (primary) hypertension: Secondary | ICD-10-CM

## 2014-03-03 LAB — BASIC METABOLIC PANEL
BUN: 20 mg/dL (ref 6–23)
CHLORIDE: 99 meq/L (ref 96–112)
CO2: 28 mEq/L (ref 19–32)
Calcium: 8.8 mg/dL (ref 8.4–10.5)
Creatinine, Ser: 0.9 mg/dL (ref 0.4–1.2)
GFR: 61.81 mL/min (ref >60.00)
Glucose, Bld: 154 mg/dL — ABNORMAL HIGH (ref 70–99)
Potassium: 4.4 mEq/L (ref 3.5–5.1)
Sodium: 138 mEq/L (ref 135–145)

## 2014-03-03 NOTE — Telephone Encounter (Signed)
lmomtcb for pt 

## 2014-03-04 NOTE — Telephone Encounter (Signed)
Pt's caregiver called back. Informed her of the recs per MW. She verbalized understanding and denied any further questions or concerns at this time.

## 2014-03-04 NOTE — Telephone Encounter (Signed)
LMTCB x 3 

## 2014-03-05 ENCOUNTER — Telehealth: Payer: Self-pay | Admitting: Pulmonary Disease

## 2014-03-05 NOTE — Progress Notes (Signed)
Quick Note:  Pt aware of results/recs. ______ 

## 2014-03-05 NOTE — Telephone Encounter (Signed)
Spoke with pt, relayed below results/recs to her.  She understands.  Asked her if I needed to contact her daughter about this to make her aware, she said I did not at this time. Pt asked about how to take her lasix.   Nyoka CowdenMichael B Wert, MD at 02/28/2014 2:13 PM     Status: Signed        Ok to resume one lasix daily prn excess swelling ------------------------------------------   Made her aware of above recs as they are the last seen recs for her lasix in her chart.  She understands, has no further questions.  Nothing further needed at this time.

## 2014-03-05 NOTE — Telephone Encounter (Signed)
Message copied by Velvet BatheAULFIELD, ASHLEY L on Wed Mar 05, 2014  3:21 PM ------      Message from: Julio SicksPARRETT, TAMMY S      Created: Wed Mar 05, 2014 12:51 PM       Electrolytes are better, Kidney fxn improve d      Cont w/ ov recs       Please contact office for sooner follow up if symptoms do not improve or worsen or seek emergency care        ------

## 2014-03-12 ENCOUNTER — Encounter: Payer: Self-pay | Admitting: Internal Medicine

## 2014-03-30 ENCOUNTER — Encounter: Payer: Self-pay | Admitting: Internal Medicine

## 2014-03-31 ENCOUNTER — Encounter: Payer: Self-pay | Admitting: Internal Medicine

## 2014-03-31 MED ORDER — LOSARTAN POTASSIUM-HCTZ 100-12.5 MG PO TABS: 1.0000 | ORAL_TABLET | Freq: Every day | ORAL | Status: AC

## 2014-03-31 NOTE — Telephone Encounter (Signed)
It is okay to change her to losartan HCTZ 100/12.5 without a visit Okay to send Rx for 1 year I will check blood work at her next visit

## 2014-04-14 LAB — HM DIABETES EYE EXAM

## 2014-04-21 ENCOUNTER — Encounter: Payer: Self-pay | Admitting: Internal Medicine

## 2014-04-21 NOTE — Telephone Encounter (Signed)
pts daughter left v/m with mychart message info and request cb.

## 2014-04-22 ENCOUNTER — Telehealth: Payer: Self-pay

## 2014-04-22 NOTE — Telephone Encounter (Signed)
Please get her set up with appt tomorrow

## 2014-04-22 NOTE — Telephone Encounter (Signed)
okay

## 2014-04-22 NOTE — Telephone Encounter (Signed)
Spoke with daughter [name] and she [facility] and they didn't find any infections just protein in her urine, daughter states pt has a CPX on 04/28/14 and will discuss further then.

## 2014-04-22 NOTE — Telephone Encounter (Signed)
PLEASE NOTE: All timestamps contained within this report are represented as [place] Standard Time. CONFIDENTIALTY NOTICE: This fax transmission is intended only for the addressee. It contains information that is legally privileged, confidential or otherwise protected from use or disclosure. If you are not the intended recipient, you are strictly prohibited from reviewing, disclosing, copying using or disseminating any of this information or taking any action in reliance on or regarding this information. If you have received this fax in error, please notify us immediately by telephone so that we can arrange for its return to us. Phone: [phone], Toll-Free: [phone], Fax: [phone] Page: 1 of 2 Call Id: [id number] [facility] - Client TELEPHONE ADVICE RECORD [facility]: [facility] Gender: Female DOB: [birth year] Age: [age] Y 6 M 27 D Return Phone Number: [phone] (Primary) Address: [address] City/State/Zip: [place] [id number] Client [facility] - Client Client Site [facility] Day Physician Tillman AbideLetvak, Richard Contact Type Call Call Type Triage / Clinical Caller Name Erica MessierKathy Relationship To Patient Daughter Return Phone Number [phone] (Primary) Chief Complaint Urination Pain Initial Comment caller states mother has painful urination PreDisposition Did not know what to do Nurse Assessment Nurse: Erica CooterHenry, RN, Erica Hansen Date/Time Lamount Cohen(Eastern Time): 04/22/2014 8:49:07 AM Confirm and document reason for call. If symptomatic, describe symptoms. ---Caller states that her mother told her she is having painful urination yesterday. She does not know how long she has had this. Unsure if she has a fever. Has the patient traveled out of the country within the last 30 days? ---No Does the patient require triage? ---Yes Related visit to physician within the last 2 weeks? ---No Does  the PT have any chronic conditions? (i.e. diabetes, asthma, etc.) ---Yes List chronic conditions. ---Diabetes, HTN Guidelines Guideline Title Affirmed Question Affirmed Notes Nurse Date/Time Lamount Cohen(Eastern Time) Urination Pain - Female Diabetes mellitus or weak immune system (e.g., HIV positive, cancer chemotherapy, transplant patient) Mila PalmerHenry, RN, Wade 04/22/2014 8:50:47 AM Disp. Time Lamount Cohen(Eastern Time) Disposition Final User 04/22/2014 8:35:06 AM Send To Clinical Follow Up Queue Erica Hansen, Erica Hansen 04/22/2014 8:53:02 AM See Physician within 4 Hours (or PCP triage) Yes Erica CooterHenry, RN, Erica Hansen Caller Understands: Yes PLEASE NOTE: All timestamps contained within this report are represented as [place] Standard Time. CONFIDENTIALTY NOTICE: This fax transmission is intended only for the addressee. It contains information that is legally privileged, confidential or otherwise protected from use or disclosure. If you are not the intended recipient, you are strictly prohibited from reviewing, disclosing, copying using or disseminating any of this information or taking any action in reliance on or regarding this information. If you have received this fax in error, please notify us immediately by telephone so that we can arrange for its return to us. Phone: [phone], Toll-Free: [phone], Fax: [phone] Page: 2 of 2 Call Id: [id number] Disagree/Comply: Comply Care Advice Given Per Guideline SEE PHYSICIAN WITHIN 4 HOURS (or PCP triage): * You become worse. After Care Instructions Given Call Event Type User Date / Time Description Comments User: Erica Hansen, Henry, RN Date/Time Lamount Cohen(Eastern Time): 04/22/2014 8:55:50 AM I called the backline and spoke with Erica Hansen who states that they don't have any openings today. She recommended the [facility] on [address] [phone]. User: Erica Hansen, Henry, RN Date/Time Lamount Cohen(Eastern Time): 04/22/2014 8:58:12 AM Caller was [place] a facility in [place] instead of  [place]. I suggested [facility].  She verbalized understanding. Referrals GO TO FACILITY UNDECIDED

## 2014-04-23 ENCOUNTER — Ambulatory Visit: Payer: Medicare PPO | Admitting: Internal Medicine

## 2014-04-24 ENCOUNTER — Encounter: Payer: Self-pay | Admitting: Internal Medicine

## 2014-04-28 ENCOUNTER — Encounter: Payer: Self-pay | Admitting: Internal Medicine

## 2014-04-28 ENCOUNTER — Ambulatory Visit (INDEPENDENT_AMBULATORY_CARE_PROVIDER_SITE_OTHER): Payer: Medicare PPO | Admitting: Internal Medicine

## 2014-04-28 VITALS — BP 150/60 | HR 71 | Temp 98.1°F | Ht 60.0 in | Wt 112.0 lb

## 2014-04-28 DIAGNOSIS — J8410 Pulmonary fibrosis, unspecified: Secondary | ICD-10-CM

## 2014-04-28 DIAGNOSIS — N183 Chronic kidney disease, stage 3 unspecified: Secondary | ICD-10-CM

## 2014-04-28 DIAGNOSIS — M6281 Muscle weakness (generalized): Secondary | ICD-10-CM

## 2014-04-28 DIAGNOSIS — E1129 Type 2 diabetes mellitus with other diabetic kidney complication: Secondary | ICD-10-CM

## 2014-04-28 DIAGNOSIS — Z0000 Encounter for general adult medical examination without abnormal findings: Secondary | ICD-10-CM | POA: Insufficient documentation

## 2014-04-28 DIAGNOSIS — F419 Anxiety disorder, unspecified: Secondary | ICD-10-CM

## 2014-04-28 DIAGNOSIS — R2681 Unsteadiness on feet: Secondary | ICD-10-CM

## 2014-04-28 DIAGNOSIS — Z7189 Other specified counseling: Secondary | ICD-10-CM | POA: Insufficient documentation

## 2014-04-28 DIAGNOSIS — N058 Unspecified nephritic syndrome with other morphologic changes: Secondary | ICD-10-CM

## 2014-04-28 DIAGNOSIS — I10 Essential (primary) hypertension: Secondary | ICD-10-CM

## 2014-04-28 LAB — HM DIABETES FOOT EXAM

## 2014-04-28 MED ORDER — ALPRAZOLAM 0.25 MG PO TABS: 0.1250 mg | ORAL_TABLET | Freq: Two times a day (BID) | ORAL | Status: DC | PRN

## 2014-04-28 MED ORDER — NADOLOL 80 MG PO TABS
80.0000 mg | ORAL_TABLET | Freq: Every day | ORAL | Status: AC
Start: 2014-04-28 — End: ?

## 2014-04-28 NOTE — Patient Instructions (Signed)
You can stop the colace (docusate) and instead take 1 capful of miralax in a full glass of water daily.

## 2014-04-28 NOTE — Addendum Note (Signed)
Addended by: Sueanne MargaritaSMITH, Jann Milkovich L on: 04/28/2014 04:20 PM   Modules accepted: Orders

## 2014-04-28 NOTE — Progress Notes (Signed)
Pre visit review using our clinic review tool, if applicable. No additional management support is needed unless otherwise documented below in the visit note. 

## 2014-04-28 NOTE — Progress Notes (Signed)
Subjective:    Patient ID: Erica Hansen, female    DOB: 1923-02-02, 80 y.o.   MRN: [id number]  HPI Here with daughter Erica Hansen for Medicare wellness and follow up of diabetes and other chronic medical problems Reviewed form and advanced directives Reviewed other doctors Has had several falls--high risk No vision or hearing problems of significance No tobacco or alcohol Requires help for multiple ADLs---has aide daily (6 days per week) No depression or anhedonia---has done well with move  Moved to [place] in August Meals are provided Weekly housekeeping Erica Hansen does [employer] Gets aide 6 days per week in AM--- less than an hour. Helps her dress, wash and get the compression hose on   Some trouble with constipation Trying a stool softener Discussed miralax  Not much of an appetite Lost a couple of pounds Had stopped checking sugars but restarting Tends to miss breakfast--she is not up by 8:30AM  All under 150 No Rx still No sores or pain in feet  Reviewed the Rx for IPF Has trouble with flutter valve--not consistently using Easy DOE--but able to walk a bit on flat surface Still with regular cough  Anxiety persists Has mirtazapine at night but with increased daytime symptoms Feels she could use something for daytime exacerbations Really doesn't like the oxygen  Current Outpatient Prescriptions on File Prior to Visit  Medication Sig Dispense Refill  . Acetaminophen (TYLENOL ARTHRITIS PAIN PO) Take 1 tablet by mouth 2 (two) times daily as needed.    Marland Kitchen. albuterol (PROVENTIL HFA;VENTOLIN HFA) 108 (90 BASE) MCG/ACT inhaler Inhale 2 puffs into the lungs every 6 (six) hours as needed for wheezing or shortness of breath. 1 Inhaler 2  . aspirin 81 MG tablet Take 81 mg by mouth daily.      . benzonatate (TESSALON) 200 MG capsule Take 1 capsule (200 mg total) by mouth 3 (three) times daily as needed for cough. 45 capsule 1  . diltiazem (CARDIZEM CD) 120 MG 24 hr capsule Take 1 capsule  (120 mg total) by mouth daily. 90 capsule 3  . famotidine (PEPCID) 20 MG tablet Take 1 tablet (20 mg total) by mouth at bedtime. 90 tablet 3  . furosemide (LASIX) 20 MG tablet Take 1 tablet (20 mg total) by mouth daily. 20 tablet 0  . glucose blood (TRUETEST TEST) test strip Use to test blood sugar once daily dx: 250.00 50 each 1  . losartan-hydrochlorothiazide (HYZAAR) 100-12.5 MG per tablet Take 1 tablet by mouth daily. 90 tablet 3  . mirtazapine (REMERON) 15 MG tablet TAKE 1 TABLET (15 MG TOTAL) BY MOUTH AT BEDTIME. 90 tablet 3  . Multiple Vitamins-Minerals (CENTRUM SILVER PO) Take 1 capsule by mouth daily.      . nadolol (CORGARD) 80 MG tablet Take 1 tablet (80 mg total) by mouth daily. 90 tablet 3  . Polyethyl Glycol-Propyl Glycol (SYSTANE) 0.4-0.3 % SOLN Place 2 drops into both eyes at bedtime.     Marland Kitchen. Respiratory Therapy Supplies (FLUTTER) DEVI Use as directed. 1 each 0   No current facility-administered medications on file prior to visit.    Allergies  Allergen Reactions  . Clindamycin     REACTION: diffuse maculopapular rash  . Delsym [Dextromethorphan]     Diarrhea  . Lisinopril     REACTION: cough  . Penicillins Rash  . Sulfonamide Derivatives Rash    Past Medical History  Diagnosis Date  . Diverticulosis   . Unspecified essential hypertension   . Osteoarthritis   .  Type II or unspecified type diabetes mellitus without mention of complication, not stated as uncontrolled   . Anxiety states   . Lung fibrosis   . Urinary incontinence     Past Surgical History  Procedure Laterality Date  . Abdominal hysterectomy    . Replacement total knee    . Laminectomy    . Humerus fracture surgery      Family History  Problem Relation Age of Onset  . Colon cancer Father     History   Social History  . Marital Status: Widowed    Spouse Name: N/A    Number of Children: 3  . Years of Education: N/A   Occupational History  .     Social History Main Topics  . Smoking  status: Never Smoker   . Smokeless tobacco: Never Used  . Alcohol Use: Yes     Comment: occasional  . Drug Use: Not on file  . Sexual Activity: Not on file   Other Topics Concern  . Not on file   Social History Narrative   lives with daughter Erica Hansen(Erica Hansen) Has 2 sons in [place]   Has living will   Daughter is health care POA   DNR order done 04/28/14   No feeding tube if cognitively unaware   Review of Systems Still some dysuria. Urinalysis negative at urgent care. Told her to stop the cipro Sleeps fairly well--but frequent nocturia. Discussed cranberry tabs---- sounds non infectious since ongoing symptoms No rash or skin problems Arthritis bad in hands--tylenol arthritis for this    Objective:   Physical Exam  Constitutional: She is oriented to person, place, and time. She appears well-developed. No distress.  HENT:  Mouth/Throat: Oropharynx is clear and moist. No oropharyngeal exudate.  Neck: Normal range of motion. Neck supple. No thyromegaly present.  Cardiovascular: Normal rate, regular rhythm and intact distal pulses.  Exam reveals no gallop.   Faint pedal pulses Gr 2/6 coarse systolic murmur  Pulmonary/Chest: Effort normal. No respiratory distress. She has no wheezes.  Faint bibasilar crackles  Abdominal: Soft. There is no tenderness.  Musculoskeletal:  1+ edema in feet  Lymphadenopathy:    She has no cervical adenopathy.  Neurological: She is alert and oriented to person, place, and time.  President-- "I know him but can't think of him now" [id number] D-l--r-o-w Recall 2/3  Skin: No rash noted. No erythema.  No foot ulcers  Psychiatric:  Mild anxiety          Assessment & Plan:

## 2014-04-28 NOTE — Assessment & Plan Note (Signed)
Her major limiting factor Continues with pulmonologist

## 2014-04-28 NOTE — Assessment & Plan Note (Signed)
Seems to be fine without meds Will check A1c

## 2014-04-28 NOTE — Assessment & Plan Note (Signed)
Will recheck labs Is on ARB 

## 2014-04-28 NOTE — Assessment & Plan Note (Signed)
I have personally reviewed the Medicare Annual Wellness questionnaire and have noted 1. The patient's medical and social history 2. Their use of alcohol, tobacco or illicit drugs 3. Their current medications and supplements 4. The patient's functional ability including ADL's, fall risks, home safety risks and hearing or visual             impairment. 5. Diet and physical activities 6. Evidence for depression or mood disorders  The patients weight, height, BMI and visual acuity have been recorded in the chart I have made referrals, counseling and provided education to the patient based review of the above and I have provided the pt with a written personalized care plan for preventive services.  I have provided you with a copy of your personalized plan for preventive services. Please take the time to review along with your updated medication list.  UTD on immunizations No cancer screening due to age Functional decline--needs help with ADLs and has aide. [facility] Urine incontinence--wears depends

## 2014-04-28 NOTE — Assessment & Plan Note (Signed)
See social history DNR order done today 

## 2014-04-28 NOTE — Assessment & Plan Note (Signed)
Will restart the alprazolam---she had let it run out

## 2014-04-28 NOTE — Assessment & Plan Note (Signed)
BP Readings from Last 3 Encounters:  04/28/14 150/60  02/17/14 136/62  12/19/13 152/64   Reasonable control for age No changes

## 2014-04-29 ENCOUNTER — Telehealth: Payer: Self-pay | Admitting: Internal Medicine

## 2014-04-29 LAB — COMPREHENSIVE METABOLIC PANEL
ALBUMIN: 3.2 g/dL — AB (ref 3.5–5.2)
ALT: 15 U/L (ref 0–35)
AST: 24 U/L (ref 0–37)
Alkaline Phosphatase: 53 U/L (ref 39–117)
BUN: 26 mg/dL — AB (ref 6–23)
CALCIUM: 9.2 mg/dL (ref 8.4–10.5)
CHLORIDE: 99 meq/L (ref 96–112)
CO2: 30 mEq/L (ref 19–32)
CREATININE: 1.2 mg/dL (ref 0.4–1.2)
GFR: 44.9 mL/min — AB (ref >60.00)
GLUCOSE: 107 mg/dL — AB (ref 70–99)
Potassium: 4.4 mEq/L (ref 3.5–5.1)
Sodium: 135 mEq/L (ref 135–145)
Total Bilirubin: 0.4 mg/dL (ref 0.2–1.2)
Total Protein: 7.9 g/dL (ref 6.0–8.3)

## 2014-04-29 LAB — CBC WITH DIFFERENTIAL/PLATELET
BASOS PCT: 0.6 % (ref 0.0–3.0)
Basophils Absolute: 0.1 10*3/uL (ref 0.0–0.1)
EOS PCT: 5.6 % — AB (ref 0.0–5.0)
Eosinophils Absolute: 0.6 10*3/uL (ref 0.0–0.7)
HCT: 32.2 % — ABNORMAL LOW (ref 36.0–46.0)
HEMOGLOBIN: 10.4 g/dL — AB (ref 12.0–15.0)
Lymphocytes Relative: 14.4 % (ref 12.0–46.0)
Lymphs Abs: 1.6 10*3/uL (ref 0.7–4.0)
MCHC: 32.3 g/dL (ref 30.0–36.0)
MCV: 89.1 fl (ref 78.0–100.0)
MONOS PCT: 3.7 % (ref 3.0–12.0)
Monocytes Absolute: 0.4 10*3/uL (ref 0.1–1.0)
NEUTROS ABS: 8.2 10*3/uL — AB (ref 1.4–7.7)
NEUTROS PCT: 75.7 % (ref 43.0–77.0)
Platelets: 353 10*3/uL (ref 150.0–400.0)
RBC: 3.62 Mil/uL — AB (ref 3.87–5.11)
RDW: 15.3 % (ref 11.5–15.5)
WBC: 10.8 10*3/uL — AB (ref 4.0–10.5)

## 2014-04-29 LAB — HEMOGLOBIN A1C: Hgb A1c MFr Bld: 7.2 % — ABNORMAL HIGH (ref 4.6–6.5)

## 2014-04-29 LAB — T4, FREE: Free T4: 0.95 ng/dL (ref 0.60–1.60)

## 2014-04-29 NOTE — Telephone Encounter (Signed)
emmi emailed °

## 2014-04-30 ENCOUNTER — Telehealth: Payer: Self-pay | Admitting: Pulmonary Disease

## 2014-04-30 ENCOUNTER — Other Ambulatory Visit: Payer: Self-pay | Admitting: Pulmonary Disease

## 2014-04-30 ENCOUNTER — Encounter: Payer: Self-pay | Admitting: Internal Medicine

## 2014-04-30 MED ORDER — BENZONATATE 200 MG PO CAPS: 200.0000 mg | ORAL_CAPSULE | Freq: Three times a day (TID) | ORAL | Status: DC | PRN

## 2014-04-30 NOTE — Telephone Encounter (Signed)
Per Dr. Vassie LollAlva, ok to refill and give 3 extra refills.  Called and spoke to patient's daughter, notified that Rx has been sent.

## 2014-04-30 NOTE — Telephone Encounter (Signed)
Called and spoke to pt. Pt is requesting tessalon refill.  Last filled on 02/17/14 with #45 with 1 refill. Pt last seen on 02/17/14 by TP. Pt is requesting a response a today.   [facility] is in office this afternoon.

## 2014-05-02 ENCOUNTER — Encounter (HOSPITAL_COMMUNITY): Payer: Self-pay | Admitting: *Deleted

## 2014-05-02 ENCOUNTER — Encounter: Payer: Self-pay | Admitting: Internal Medicine

## 2014-05-02 ENCOUNTER — Inpatient Hospital Stay (HOSPITAL_COMMUNITY)
Admission: EM | Admit: 2014-05-02 | Discharge: 2014-05-06 | DRG: 193 | Disposition: A | Discharge status: Home / Self Care | Payer: Medicare PPO | Attending: Pulmonary Disease | Admitting: Pulmonary Disease

## 2014-05-02 ENCOUNTER — Emergency Department (HOSPITAL_COMMUNITY): Payer: Medicare PPO

## 2014-05-02 ENCOUNTER — Telehealth: Payer: Self-pay

## 2014-05-02 DIAGNOSIS — Z7982 Long term (current) use of aspirin: Secondary | ICD-10-CM | POA: Diagnosis not present

## 2014-05-02 DIAGNOSIS — J9621 Acute and chronic respiratory failure with hypoxia: Secondary | ICD-10-CM

## 2014-05-02 DIAGNOSIS — E1122 Type 2 diabetes mellitus with diabetic chronic kidney disease: Secondary | ICD-10-CM | POA: Diagnosis present

## 2014-05-02 DIAGNOSIS — J810 Acute pulmonary edema: Secondary | ICD-10-CM

## 2014-05-02 DIAGNOSIS — I10 Essential (primary) hypertension: Secondary | ICD-10-CM | POA: Diagnosis present

## 2014-05-02 DIAGNOSIS — J9611 Chronic respiratory failure with hypoxia: Secondary | ICD-10-CM | POA: Diagnosis not present

## 2014-05-02 DIAGNOSIS — N183 Chronic kidney disease, stage 3 unspecified: Secondary | ICD-10-CM | POA: Diagnosis present

## 2014-05-02 DIAGNOSIS — F419 Anxiety disorder, unspecified: Secondary | ICD-10-CM | POA: Diagnosis present

## 2014-05-02 DIAGNOSIS — Z881 Allergy status to other antibiotic agents status: Secondary | ICD-10-CM

## 2014-05-02 DIAGNOSIS — J189 Pneumonia, unspecified organism: Secondary | ICD-10-CM | POA: Diagnosis not present

## 2014-05-02 DIAGNOSIS — J8410 Pulmonary fibrosis, unspecified: Secondary | ICD-10-CM | POA: Diagnosis present

## 2014-05-02 DIAGNOSIS — Z880 Allergy status to penicillin: Secondary | ICD-10-CM

## 2014-05-02 DIAGNOSIS — M1990 Unspecified osteoarthritis, unspecified site: Secondary | ICD-10-CM | POA: Diagnosis present

## 2014-05-02 DIAGNOSIS — J9610 Chronic respiratory failure, unspecified whether with hypoxia or hypercapnia: Secondary | ICD-10-CM | POA: Diagnosis present

## 2014-05-02 DIAGNOSIS — E1129 Type 2 diabetes mellitus with other diabetic kidney complication: Secondary | ICD-10-CM | POA: Diagnosis present

## 2014-05-02 DIAGNOSIS — Z66 Do not resuscitate: Secondary | ICD-10-CM | POA: Diagnosis present

## 2014-05-02 DIAGNOSIS — J9620 Acute and chronic respiratory failure, unspecified whether with hypoxia or hypercapnia: Secondary | ICD-10-CM | POA: Diagnosis present

## 2014-05-02 DIAGNOSIS — I129 Hypertensive chronic kidney disease with stage 1 through stage 4 chronic kidney disease, or unspecified chronic kidney disease: Secondary | ICD-10-CM | POA: Diagnosis present

## 2014-05-02 DIAGNOSIS — Z9181 History of falling: Secondary | ICD-10-CM

## 2014-05-02 DIAGNOSIS — R0602 Shortness of breath: Secondary | ICD-10-CM | POA: Diagnosis not present

## 2014-05-02 DIAGNOSIS — Z9981 Dependence on supplemental oxygen: Secondary | ICD-10-CM | POA: Diagnosis not present

## 2014-05-02 DIAGNOSIS — Z7189 Other specified counseling: Secondary | ICD-10-CM

## 2014-05-02 DIAGNOSIS — J811 Chronic pulmonary edema: Secondary | ICD-10-CM | POA: Diagnosis present

## 2014-05-02 LAB — CBC WITH DIFFERENTIAL/PLATELET
BASOS ABS: 0 10*3/uL (ref 0.0–0.1)
Basophils Relative: 0 % (ref 0–1)
Eosinophils Absolute: 0.1 10*3/uL (ref 0.0–0.7)
Eosinophils Relative: 1 % (ref 0–5)
HCT: 31.9 % — ABNORMAL LOW (ref 36.0–46.0)
Hemoglobin: 9.9 g/dL — ABNORMAL LOW (ref 12.0–15.0)
Lymphocytes Relative: 17 % (ref 12–46)
Lymphs Abs: 1.7 10*3/uL (ref 0.7–4.0)
MCH: 27.7 pg (ref 26.0–34.0)
MCHC: 31 g/dL (ref 30.0–36.0)
MCV: 89.1 fL (ref 78.0–100.0)
MONOS PCT: 7 % (ref 3–12)
Monocytes Absolute: 0.7 10*3/uL (ref 0.1–1.0)
Neutro Abs: 7.6 10*3/uL (ref 1.7–7.7)
Neutrophils Relative %: 75 % (ref 43–77)
Platelets: 281 10*3/uL (ref 150–400)
RBC: 3.58 MIL/uL — AB (ref 3.87–5.11)
RDW: 14.8 % (ref 11.5–15.5)
WBC: 10.1 10*3/uL (ref 4.0–10.5)

## 2014-05-02 LAB — COMPREHENSIVE METABOLIC PANEL
ALBUMIN: 3 g/dL — AB (ref 3.5–5.2)
ALK PHOS: 65 U/L (ref 39–117)
ALT: 13 U/L (ref 0–35)
AST: 20 U/L (ref 0–37)
Anion gap: 10 (ref 5–15)
BILIRUBIN TOTAL: 0.3 mg/dL (ref 0.3–1.2)
BUN: 30 mg/dL — AB (ref 6–23)
CHLORIDE: 95 meq/L — AB (ref 96–112)
CO2: 31 mEq/L (ref 19–32)
Calcium: 9.4 mg/dL (ref 8.4–10.5)
Creatinine, Ser: 0.99 mg/dL (ref 0.50–1.10)
GFR calc Af Amer: 57 mL/min — ABNORMAL LOW (ref >90)
GFR calc non Af Amer: 49 mL/min — ABNORMAL LOW (ref >90)
Glucose, Bld: 161 mg/dL — ABNORMAL HIGH (ref 70–99)
Potassium: 4.3 mEq/L (ref 3.7–5.3)
SODIUM: 136 meq/L — AB (ref 137–147)
Total Protein: 8 g/dL (ref 6.0–8.3)

## 2014-05-02 LAB — TROPONIN I
Troponin I: <0.3 ng/mL (ref <0.30)
Troponin I: <0.3 ng/mL (ref <0.30)

## 2014-05-02 LAB — PRO B NATRIURETIC PEPTIDE: Pro B Natriuretic peptide (BNP): 7064 pg/mL — ABNORMAL HIGH (ref 0–450)

## 2014-05-02 LAB — URINE MICROSCOPIC-ADD ON

## 2014-05-02 LAB — URINALYSIS, ROUTINE W REFLEX MICROSCOPIC
BILIRUBIN URINE: NEGATIVE
Glucose, UA: NEGATIVE mg/dL
Hgb urine dipstick: NEGATIVE
KETONES UR: NEGATIVE mg/dL
Leukocytes, UA: NEGATIVE
NITRITE: NEGATIVE
Protein, ur: 100 mg/dL — AB
Specific Gravity, Urine: 1.012 (ref 1.005–1.030)
UROBILINOGEN UA: 0.2 mg/dL (ref 0.0–1.0)
pH: 6.5 (ref 5.0–8.0)

## 2014-05-02 LAB — PROCALCITONIN: PROCALCITONIN: 0.11 ng/mL

## 2014-05-02 MED ORDER — ALBUTEROL SULFATE (2.5 MG/3ML) 0.083% IN NEBU
2.5000 mg | INHALATION_SOLUTION | RESPIRATORY_TRACT | Status: DC | PRN
  Administered 2014-05-03: 2.5 mg via RESPIRATORY_TRACT
  Filled 2014-05-02: qty 3

## 2014-05-02 MED ORDER — MIRTAZAPINE 15 MG PO TABS
15.0000 mg | ORAL_TABLET | Freq: Every day | ORAL | Status: DC
  Administered 2014-05-02 – 2014-05-05 (×4): 15 mg via ORAL
  Filled 2014-05-02 (×5): qty 1

## 2014-05-02 MED ORDER — DILTIAZEM HCL ER COATED BEADS 120 MG PO CP24
120.0000 mg | ORAL_CAPSULE | Freq: Every day | ORAL | Status: DC
  Administered 2014-05-03 – 2014-05-06 (×4): 120 mg via ORAL
  Filled 2014-05-02 (×6): qty 1

## 2014-05-02 MED ORDER — SODIUM CHLORIDE 0.9 % IJ SOLN: 3.0000 mL | INTRAMUSCULAR | Status: DC | PRN

## 2014-05-02 MED ORDER — HYDROCHLOROTHIAZIDE 12.5 MG PO CAPS
12.5000 mg | ORAL_CAPSULE | Freq: Every day | ORAL | Status: DC
  Administered 2014-05-03 – 2014-05-05 (×3): 12.5 mg via ORAL
  Filled 2014-05-02 (×3): qty 1

## 2014-05-02 MED ORDER — FAMOTIDINE 20 MG PO TABS
20.0000 mg | ORAL_TABLET | Freq: Every day | ORAL | Status: DC
  Administered 2014-05-02 – 2014-05-05 (×4): 20 mg via ORAL
  Filled 2014-05-02 (×5): qty 1

## 2014-05-02 MED ORDER — LEVOFLOXACIN IN D5W 500 MG/100ML IV SOLN
500.0000 mg | Freq: Once | INTRAVENOUS | Status: DC
  Filled 2014-05-02: qty 100

## 2014-05-02 MED ORDER — DEXTROSE 5 % IV SOLN
1.0000 g | INTRAVENOUS | Status: DC
  Administered 2014-05-03 – 2014-05-04 (×2): 1 g via INTRAVENOUS
  Filled 2014-05-02 (×3): qty 10

## 2014-05-02 MED ORDER — CETYLPYRIDINIUM CHLORIDE 0.05 % MT LIQD
7.0000 mL | Freq: Two times a day (BID) | OROMUCOSAL | Status: DC
  Administered 2014-05-02 – 2014-05-06 (×7): 7 mL via OROMUCOSAL

## 2014-05-02 MED ORDER — BENZONATATE 100 MG PO CAPS
200.0000 mg | ORAL_CAPSULE | Freq: Three times a day (TID) | ORAL | Status: DC | PRN
  Administered 2014-05-02 – 2014-05-05 (×5): 200 mg via ORAL
  Filled 2014-05-02 (×5): qty 2

## 2014-05-02 MED ORDER — POLYVINYL ALCOHOL 1.4 % OP SOLN
2.0000 [drp] | Freq: Every evening | OPHTHALMIC | Status: DC | PRN
  Filled 2014-05-02: qty 15

## 2014-05-02 MED ORDER — IPRATROPIUM-ALBUTEROL 0.5-2.5 (3) MG/3ML IN SOLN
3.0000 mL | Freq: Four times a day (QID) | RESPIRATORY_TRACT | Status: DC
  Administered 2014-05-02 – 2014-05-06 (×15): 3 mL via RESPIRATORY_TRACT
  Filled 2014-05-02 (×15): qty 3

## 2014-05-02 MED ORDER — FUROSEMIDE 10 MG/ML IJ SOLN: 40.0000 mg | Freq: Once | INTRAMUSCULAR | Status: DC

## 2014-05-02 MED ORDER — POLYETHYLENE GLYCOL 3350 17 G PO PACK
17.0000 g | PACK | Freq: Every day | ORAL | Status: DC
  Administered 2014-05-03 – 2014-05-05 (×3): 17 g via ORAL
  Filled 2014-05-02 (×5): qty 1

## 2014-05-02 MED ORDER — LOSARTAN POTASSIUM-HCTZ 100-12.5 MG PO TABS: 1.0000 | ORAL_TABLET | Freq: Every day | ORAL | Status: DC

## 2014-05-02 MED ORDER — ASPIRIN 81 MG PO CHEW
324.0000 mg | CHEWABLE_TABLET | ORAL | Status: DC
  Filled 2014-05-02: qty 4

## 2014-05-02 MED ORDER — FUROSEMIDE 10 MG/ML IJ SOLN
20.0000 mg | Freq: Once | INTRAMUSCULAR | Status: AC
  Administered 2014-05-03: 20 mg via INTRAVENOUS
  Filled 2014-05-02: qty 2

## 2014-05-02 MED ORDER — ASPIRIN EC 81 MG PO TBEC
81.0000 mg | DELAYED_RELEASE_TABLET | Freq: Every day | ORAL | Status: DC
  Administered 2014-05-03 – 2014-05-06 (×4): 81 mg via ORAL
  Filled 2014-05-02 (×4): qty 1

## 2014-05-02 MED ORDER — ALBUTEROL SULFATE (2.5 MG/3ML) 0.083% IN NEBU
5.0000 mg | INHALATION_SOLUTION | Freq: Once | RESPIRATORY_TRACT | Status: AC
  Administered 2014-05-02: 5 mg via RESPIRATORY_TRACT
  Filled 2014-05-02: qty 6

## 2014-05-02 MED ORDER — NADOLOL 80 MG PO TABS
80.0000 mg | ORAL_TABLET | Freq: Every day | ORAL | Status: DC
  Administered 2014-05-03 – 2014-05-06 (×4): 80 mg via ORAL
  Filled 2014-05-02 (×4): qty 1

## 2014-05-02 MED ORDER — SODIUM CHLORIDE 0.9 % IJ SOLN
3.0000 mL | Freq: Two times a day (BID) | INTRAMUSCULAR | Status: DC
  Administered 2014-05-02 – 2014-05-06 (×8): 3 mL via INTRAVENOUS

## 2014-05-02 MED ORDER — HYDRALAZINE HCL 20 MG/ML IJ SOLN
10.0000 mg | INTRAMUSCULAR | Status: DC | PRN
Start: 2014-05-02 — End: 2014-05-06
  Administered 2014-05-02: 10 mg via INTRAVENOUS
  Filled 2014-05-02: qty 1

## 2014-05-02 MED ORDER — POTASSIUM CHLORIDE CRYS ER 20 MEQ PO TBCR
20.0000 meq | EXTENDED_RELEASE_TABLET | Freq: Every day | ORAL | Status: AC
  Administered 2014-05-03: 20 meq via ORAL
  Filled 2014-05-02 (×2): qty 1

## 2014-05-02 MED ORDER — IPRATROPIUM BROMIDE 0.02 % IN SOLN
0.5000 mg | Freq: Once | RESPIRATORY_TRACT | Status: AC
  Administered 2014-05-02: 0.5 mg via RESPIRATORY_TRACT
  Filled 2014-05-02: qty 2.5

## 2014-05-02 MED ORDER — ENOXAPARIN SODIUM 30 MG/0.3ML ~~LOC~~ SOLN
30.0000 mg | SUBCUTANEOUS | Status: DC
  Administered 2014-05-03 – 2014-05-05 (×3): 30 mg via SUBCUTANEOUS
  Filled 2014-05-02 (×4): qty 0.3

## 2014-05-02 MED ORDER — CEFTRIAXONE SODIUM 1 G IJ SOLR
1.0000 g | Freq: Once | INTRAMUSCULAR | Status: AC
  Administered 2014-05-02: 1 g via INTRAVENOUS
  Filled 2014-05-02: qty 10

## 2014-05-02 MED ORDER — LOSARTAN POTASSIUM 50 MG PO TABS
100.0000 mg | ORAL_TABLET | Freq: Every day | ORAL | Status: DC
  Administered 2014-05-03 – 2014-05-05 (×3): 100 mg via ORAL
  Filled 2014-05-02 (×3): qty 2

## 2014-05-02 MED ORDER — ALPRAZOLAM 0.25 MG PO TABS
0.2500 mg | ORAL_TABLET | Freq: Two times a day (BID) | ORAL | Status: DC | PRN
  Administered 2014-05-03 – 2014-05-04 (×4): 0.25 mg via ORAL
  Filled 2014-05-02 (×5): qty 1

## 2014-05-02 MED ORDER — SODIUM CHLORIDE 0.9 % IV SOLN: 250.0000 mL | INTRAVENOUS | Status: DC | PRN

## 2014-05-02 MED ORDER — DEXTROSE 5 % IV SOLN
500.0000 mg | Freq: Once | INTRAVENOUS | Status: DC
  Filled 2014-05-02: qty 500

## 2014-05-02 MED ORDER — POLYETHYL GLYCOL-PROPYL GLYCOL 0.4-0.3 % OP SOLN: 2.0000 [drp] | Freq: Every evening | OPHTHALMIC | Status: DC | PRN

## 2014-05-02 MED ORDER — FUROSEMIDE 10 MG/ML IJ SOLN
40.0000 mg | Freq: Once | INTRAMUSCULAR | Status: AC
  Administered 2014-05-02: 40 mg via INTRAVENOUS
  Filled 2014-05-02: qty 4

## 2014-05-02 MED ORDER — ASPIRIN 300 MG RE SUPP: 300.0000 mg | RECTAL | Status: DC

## 2014-05-02 NOTE — Telephone Encounter (Signed)
ER

## 2014-05-02 NOTE — ED Notes (Signed)
EDP at bedside  

## 2014-05-02 NOTE — ED Notes (Signed)
Attempted to call report

## 2014-05-02 NOTE — Telephone Encounter (Signed)
Noted thank you

## 2014-05-02 NOTE — ED Notes (Signed)
Pt reports sob for extended amount of time but worsening over the past two days. Hx of pulmonary fibrosis. Reports productive cough with yellow sputum. Denies fever.

## 2014-05-02 NOTE — Progress Notes (Addendum)
ANTIBIOTIC CONSULT NOTE - INITIAL  Pharmacy Consult for rocephin Indication: pneumonia  Allergies  Allergen Reactions  . Clindamycin     REACTION: diffuse maculopapular rash  . Delsym [Dextromethorphan]     Diarrhea  . Lisinopril     REACTION: cough  . Penicillins Hives and Rash  . Sulfonamide Derivatives Rash    Patient Measurements: Height: 5' (152.4 cm) Weight: 112 lb (50.803 kg) IBW/kg (Calculated) : 45.5 Adjusted Body Weight:   Vital Signs: Temp: 97.5 F (36.4 C) (12/18 1315) Temp Source: Oral (12/18 1315) BP: 208/97 mmHg (12/18 1600) Pulse Rate: 62 (12/18 1600) Intake/Output from previous day:   Intake/Output from this shift:    Labs:  Recent Labs  05/02/14 1350  WBC 10.1  HGB 9.9*  PLT 281  CREATININE 0.99   Estimated Creatinine Clearance: 27.7 mL/min (by C-G formula based on Cr of 0.99). No results for input(s): VANCOTROUGH, VANCOPEAK, VANCORANDOM, GENTTROUGH, GENTPEAK, GENTRANDOM, TOBRATROUGH, TOBRAPEAK, TOBRARND, AMIKACINPEAK, AMIKACINTROU, AMIKACIN in the last 72 hours.   Microbiology: No results found for this or any previous visit (from the past 720 hour(s)).  Medical History: Past Medical History  Diagnosis Date  . Diverticulosis   . Unspecified essential hypertension   . Osteoarthritis   . Type II or unspecified type diabetes mellitus without mention of complication, not stated as uncontrolled   . Anxiety states   . Lung fibrosis   . Urinary incontinence     Medications:  Scheduled:  . aspirin  324 mg Oral NOW   Or  . aspirin  300 mg Rectal NOW  . aspirin EC  81 mg Oral Daily  . diltiazem  120 mg Oral Daily  . enoxaparin (LOVENOX) injection  30 mg Subcutaneous Q24H  . famotidine  20 mg Oral QHS  . [START ON 05/03/2014] furosemide  20 mg Intravenous Once  . losartan-hydrochlorothiazide  1 tablet Oral Daily  . mirtazapine  15 mg Oral QHS  . nadolol  80 mg Oral Daily  . polyethylene glycol  17 g Oral Daily  . potassium chloride   20 mEq Oral Daily  . sodium chloride  3 mL Intravenous Q12H   Infusions:  . sodium chloride    . sodium chloride    . azithromycin (ZITHROMAX) 500 MG IVPB     Assessment: [age] yo with a hx of pulm fibrosis who was admitted for worsening cough and SOB. Giving empiric rocephin for PNA. She does have a hx of allergy to PCN. Cross reactivity is low when it comes to 3rd and 4th generation cephalosporins. She has gotten a dose already in the ED without issues. Since it doesn't require adjustment, we'll sign off.   Plan:   Rocephin 1g IV q24 Rx will sign off  Ulyses SouthwardMinh Pham, PharmD Pager: [phone] 05/02/2014 4:28 PM

## 2014-05-02 NOTE — Telephone Encounter (Addendum)
Spoke with Erica Hansen and she is very SOB; Erica Hansen is by herself, Erica Hansen will go to ED but does not want to go by ambulance; Erica Hansen request I call her daughter [name] and ask [name] to take Erica Hansen to hospital. Spoke with [name] and she will take her mother to ED; [name] will be at her mother' s home in 10 mins. Erica Hansen advised; Erica Hansen is very apprehensive and I will stay on phone with Erica Hansen until [name] gets there. Erica Hansen is presently wearing oxygen; but Erica Hansen does not how many L she is taking. Erica Hansen said SOB started last night. [name] has arrived at pts home and [name] will cb to give update later.

## 2014-05-02 NOTE — H&P (Signed)
Name: Erica Hansen MRN: [id number] DOB: 1922/06/16    ADMISSION DATE:  05/02/2014  REFERRING MD :  Dr. Patria Mane   CHIEF COMPLAINT:  SOB  BRIEF PATIENT DESCRIPTION:  80 y/o F with pulmonary fibrosis (followed by RA) admitted 12/18 with progressive cough & SOB.   SIGNIFICANT EVENTS  12/18  Admit with cough & SOB   STUDIES:  11/08/13  ECHO >> nml LV, nml systolic fxn, EF 40-98%, no RWMA.  Mild stenosis of AV.  12/18  CXR >> underlying pulmonary fibrosis with increase in interstitial markings (?edema)  HISTORY OF PRESENT ILLNESS:  80 y/o F with PMH of HTN, Osteoarthritis, DM II, Anxiety, and pulmonary fibrosis on 2L oxygen (followed by RA) who presented to the [facility] ER on 12/18 with complaints of worsening productive cough & SOB.  On 12/16, she called the pulmonary office requesting a tessalon refill for cough.  Patient called her primary office on 12/18 with complaints of worsening SOB, due to how the patient sounded on the phone, the office called her daughter to bring her to the ER.  On presentation to the ER, she reported cough with yellow sputum production and progressive shortness of breath.    ER evaluation revealed a CXR with underlying pulmonary fibrosis, increase in interstitial markings concerning for PNA, Na 136, K 4.3, Cl 95, glucose 161, troponin <0.30, pBNP 7064, WBC 10.1, hgb 9.9, and platelets 281.  Patient was also noted to be significantly hypertensive in ER with SBP's ranging from 165-205 / DBP 54-114.  O2 saturations remained 89-92% on 2-3L. She also reports orthopnea & increased LE swelling.    PAST MEDICAL HISTORY :   has a past medical history of Diverticulosis; Unspecified essential hypertension; Osteoarthritis; Type II or unspecified type diabetes mellitus without mention of complication, not stated as uncontrolled; Anxiety states; Lung fibrosis; and Urinary incontinence.  has past surgical history that includes Abdominal hysterectomy; Replacement total knee;  Laminectomy; and Humerus fracture surgery.   HOME MEDICATIONS:  Prior to Admission medications   Medication Sig Start Date End Date Taking? Authorizing Provider  Acetaminophen (TYLENOL ARTHRITIS PAIN PO) Take 1 tablet by mouth 2 (two) times daily as needed.    Historical Provider, MD  albuterol (PROVENTIL HFA;VENTOLIN HFA) 108 (90 BASE) MCG/ACT inhaler Inhale 2 puffs into the lungs every 6 (six) hours as needed for wheezing or shortness of breath. 10/09/13   Esperanza Sheets, MD  ALPRAZolam Prudy Feeler) 0.25 MG tablet Take 0.5-1 tablets (0.125-0.25 mg total) by mouth 2 (two) times daily as needed for anxiety. 04/28/14   Karie Schwalbe, MD  aspirin 81 MG tablet Take 81 mg by mouth daily.      Historical Provider, MD  benzonatate (TESSALON) 200 MG capsule Take 1 capsule (200 mg total) by mouth 3 (three) times daily as needed for cough. 04/30/14   Oretha Milch, MD  ciprofloxacin (CIPRO) 500 MG tablet Take 1 tablet by mouth 2 (two) times daily. 04/22/14   Historical Provider, MD  diltiazem (CARDIZEM CD) 120 MG 24 hr capsule Take 1 capsule (120 mg total) by mouth daily. 11/20/13   Karie Schwalbe, MD  famotidine (PEPCID) 20 MG tablet Take 1 tablet (20 mg total) by mouth at bedtime. 10/03/13   Karie Schwalbe, MD  furosemide (LASIX) 20 MG tablet Take 1 tablet (20 mg total) by mouth daily. 02/05/14   Oretha Milch, MD  glucose blood (TRUETEST TEST) test strip Use to test blood sugar once daily dx: 250.00  09/06/13   Karie Schwalbeichard I Letvak, MD  losartan-hydrochlorothiazide (HYZAAR) 100-12.5 MG per tablet Take 1 tablet by mouth daily. 03/31/14   Karie Schwalbeichard I Letvak, MD  mirtazapine (REMERON) 15 MG tablet TAKE 1 TABLET (15 MG TOTAL) BY MOUTH AT BEDTIME. 11/22/13   Karie Schwalbeichard I Letvak, MD  Multiple Vitamins-Minerals (CENTRUM SILVER PO) Take 1 capsule by mouth daily.      Historical Provider, MD  nadolol (CORGARD) 80 MG tablet Take 1 tablet (80 mg total) by mouth daily. 04/28/14   Karie Schwalbeichard I Letvak, MD  Polyethyl Glycol-Propyl  Glycol (SYSTANE) 0.4-0.3 % SOLN Place 2 drops into both eyes at bedtime.     Historical Provider, MD  polyethylene glycol (MIRALAX / GLYCOLAX) packet Take 17 g by mouth daily.    Historical Provider, MD  Respiratory Therapy Supplies (FLUTTER) DEVI Use as directed. 10/31/13   Julio Sicksammy S Parrett, NP   Allergies  Allergen Reactions  . Clindamycin     REACTION: diffuse maculopapular rash  . Delsym [Dextromethorphan]     Diarrhea  . Lisinopril     REACTION: cough  . Penicillins Hives and Rash  . Sulfonamide Derivatives Rash    FAMILY HISTORY:  family history includes Colon cancer in her father.   SOCIAL HISTORY:  reports that she has never smoked. She has never used smokeless tobacco. She reports that she drinks alcohol.  REVIEW OF SYSTEMS:   Constitutional: Negative for fever, chills, weight loss, malaise/fatigue and diaphoresis. Positive for progressive weakness over 1 month HENT: Negative for hearing loss, ear pain, nosebleeds, congestion, sore throat, neck pain, tinnitus and ear discharge.   Eyes: Negative for blurred vision, double vision, photophobia, pain, discharge and redness.  Respiratory: Negative for hemoptysis,  wheezing and stridor.  Positive for sputum production, shortness of breath, cough.  Cardiovascular: Negative for chest pain, palpitations, orthopnea, claudication, leg swelling and PND.  Gastrointestinal: Negative for heartburn, nausea, vomiting, abdominal pain, diarrhea, constipation, blood in stool and melena.  Genitourinary: Negative for dysuria, urgency, frequency, hematuria and flank pain.  Musculoskeletal: Negative for myalgias, back pain, joint pain and falls.  Skin: Negative for itching and rash.  Neurological: Negative for dizziness, tingling, tremors, sensory change, speech change, focal weakness, seizures, loss of consciousness, weakness and headaches.  Endo/Heme/Allergies: Negative for environmental allergies and polydipsia. Does not bruise/bleed  easily.  SUBJECTIVE:   VITAL SIGNS: Temp:  [97.5 F (36.4 C)] 97.5 F (36.4 C) (12/18 1315) Pulse Rate:  [60-72] 65 (12/18 1458) Resp:  [19-27] 19 (12/18 1458) BP: (165-205)/(54-114) 165/114 mmHg (12/18 1458) SpO2:  [89 %-96 %] 92 % (12/18 1538) FiO2 (%):  [2 %] 2 % (12/18 1330) Weight:  [112 lb (50.803 kg)] 112 lb (50.803 kg) (12/18 1315)  PHYSICAL EXAMINATION: General:  Frail elderly without overt dyspnea @ rest Neuro:  AAOx4, speech clear, MAE HEENT: WNL, JVP not well visualized Cardiovascular: Reg, + syst M Lungs: diffuse bilateral crackles, minimal scattered wheezes Abdomen: Soft, + BS Ext: symmetric 2+ pretibial edema    Recent Labs Lab 04/28/14 1622 05/02/14 1350  NA 135 136*  K 4.4 4.3  CL 99 95*  CO2 30 31  BUN 26* 30*  CREATININE 1.2 0.99  GLUCOSE 107* 161*    Recent Labs Lab 04/28/14 1622 05/02/14 1350  HGB 10.4* 9.9*  HCT 32.2* 31.9*  WBC 10.8* 10.1  PLT 353.0 281   Dg Chest 2 View  05/02/2014   CLINICAL DATA:  Shortness of breath.  Cough.  Pulmonary fibrosis.  EXAM: CHEST  2 VIEW  COMPARISON:  10/31/2013.  FINDINGS: Severe chronic lung disease with diffuse coarsened interstitial markings. There is been interval increase in the perihilar interstitial markings bilaterally as well as in the posterior lower lobes, left greater right. The cardiomediastinal contours are unchanged with grossly stable cardiomegaly, partially obscured. Minimal blunting of the costophrenic angles, no large pleural effusion. There is no pneumothorax. No acute osseous abnormalities are seen, postsurgical change again noted right proximal humerus.  IMPRESSION: Extensive chronic lung disease. There is an interval increase in interstitial markings in the perihilar and lower lobes, this may reflect pulmonary edema versus pneumonia.   Electronically Signed   By: Rubye OaksMelanie  Ehinger M.D.   On: 05/02/2014 14:44    ASSESSMENT / PLAN: Advanced Pulmonary Fibrosis Suspect Acute Pulmonary  Edema due to accelerated HTN Possible bronchitis (purulent sputum)   Plan: Additional Lasix + KCL in am  Follow up CXR in am  PRN albuterol  Assess PCT Continue rocephin monotherapy Tessalon for cough  HTN R/O MI   Plan: 12 lead now  Troponin cycle  Continue cardizem, losartan-HCTZ, nadolol PRN hydralazine for SBP > 170 ASA  DM   Plan: Monitor glucose on BMP If glucose > 180, add SSI  Carb modified diet   Anxiety   Plan: Continue xanax, remeron  Canary BrimBrandi Ollis, NP-C [facility] Pgr: [phone] or [phone]  PCCM ATTENDING: Pt seen on work rounds with care provider noted above. We reviewed pt's initial presentation, consultants notes and hospital database in detail.  The above assessment and plan was formulated under my direction.  We discussed advanced directives. Pt wishes to be DNR   Billy Fischeravid Nishant Schrecengost, MD;  PCCM service; Mobile [phone]   05/02/2014, 3:42 PM

## 2014-05-02 NOTE — ED Provider Notes (Signed)
CSN: [id number]     Arrival date & time 05/02/14  1306 History   First MD Initiated Contact with Patient 05/02/14 1319     Chief Complaint  Patient presents with  . Shortness of Breath      HPI Hx of oxygen dependent pulmonary fibrosis without a hx of CHF presenting with 2 days of worsening SOB with productive cough. No fevers. Feels SOB. Denies orthopnea. No hx of COPD. Denies unilateral leg swelling. No cp or back pain. Symptoms are mild to moderate at this time.  Family reports shortened speech pattern.   Past Medical History  Diagnosis Date  . Diverticulosis   . Unspecified essential hypertension   . Osteoarthritis   . Type II or unspecified type diabetes mellitus without mention of complication, not stated as uncontrolled   . Anxiety states   . Lung fibrosis   . Urinary incontinence    Past Surgical History  Procedure Laterality Date  . Abdominal hysterectomy    . Replacement total knee    . Laminectomy    . Humerus fracture surgery     Family History  Problem Relation Age of Onset  . Colon cancer Father    History  Substance Use Topics  . Smoking status: Never Smoker   . Smokeless tobacco: Never Used  . Alcohol Use: Yes     Comment: occasional   OB History    No data available     Review of Systems  All other systems reviewed and are negative.     Allergies  Clindamycin; Delsym; Lisinopril; Penicillins; and Sulfonamide derivatives  Home Medications   Prior to Admission medications   Medication Sig Start Date End Date Taking? Authorizing Provider  Acetaminophen (TYLENOL ARTHRITIS PAIN PO) Take 1 tablet by mouth 2 (two) times daily as needed.    Historical Provider, MD  albuterol (PROVENTIL HFA;VENTOLIN HFA) 108 (90 BASE) MCG/ACT inhaler Inhale 2 puffs into the lungs every 6 (six) hours as needed for wheezing or shortness of breath. 10/09/13   Esperanza SheetsUlugbek N Buriev, MD  ALPRAZolam Prudy Feeler(XANAX) 0.25 MG tablet Take 0.5-1 tablets (0.125-0.25 mg total) by mouth 2  (two) times daily as needed for anxiety. 04/28/14   Karie Schwalbeichard I Letvak, MD  aspirin 81 MG tablet Take 81 mg by mouth daily.      Historical Provider, MD  benzonatate (TESSALON) 200 MG capsule Take 1 capsule (200 mg total) by mouth 3 (three) times daily as needed for cough. 04/30/14   Oretha Milchakesh Alva V, MD  ciprofloxacin (CIPRO) 500 MG tablet Take 1 tablet by mouth 2 (two) times daily. 04/22/14   Historical Provider, MD  diltiazem (CARDIZEM CD) 120 MG 24 hr capsule Take 1 capsule (120 mg total) by mouth daily. 11/20/13   Karie Schwalbeichard I Letvak, MD  famotidine (PEPCID) 20 MG tablet Take 1 tablet (20 mg total) by mouth at bedtime. 10/03/13   Karie Schwalbeichard I Letvak, MD  furosemide (LASIX) 20 MG tablet Take 1 tablet (20 mg total) by mouth daily. 02/05/14   Oretha Milchakesh Alva V, MD  glucose blood (TRUETEST TEST) test strip Use to test blood sugar once daily dx: 250.00 09/06/13   Karie Schwalbeichard I Letvak, MD  losartan-hydrochlorothiazide (HYZAAR) 100-12.5 MG per tablet Take 1 tablet by mouth daily. 03/31/14   Karie Schwalbeichard I Letvak, MD  mirtazapine (REMERON) 15 MG tablet TAKE 1 TABLET (15 MG TOTAL) BY MOUTH AT BEDTIME. 11/22/13   Karie Schwalbeichard I Letvak, MD  Multiple Vitamins-Minerals (CENTRUM SILVER PO) Take 1 capsule by mouth daily.  Historical Provider, MD  nadolol (CORGARD) 80 MG tablet Take 1 tablet (80 mg total) by mouth daily. 04/28/14   Karie Schwalbeichard I Letvak, MD  Polyethyl Glycol-Propyl Glycol (SYSTANE) 0.4-0.3 % SOLN Place 2 drops into both eyes at bedtime.     Historical Provider, MD  polyethylene glycol (MIRALAX / GLYCOLAX) packet Take 17 g by mouth daily.    Historical Provider, MD  Respiratory Therapy Supplies (FLUTTER) DEVI Use as directed. 10/31/13   Tammy S Parrett, NP   BP 165/114 mmHg  Pulse 65  Temp(Src) 97.5 F (36.4 C) (Oral)  Resp 19  Ht 5' (1.524 m)  Wt 112 lb (50.803 kg)  BMI 21.87 kg/m2  SpO2 92% Physical Exam  Constitutional: She is oriented to person, place, and time. She appears well-developed and well-nourished. No  distress.  HENT:  Head: Normocephalic and atraumatic.  Eyes: EOM are normal.  Neck: Normal range of motion.  Cardiovascular: Normal rate, regular rhythm and normal heart sounds.   Pulmonary/Chest: Breath sounds normal. She has no wheezes.  Tachypnea.  Rhonchi bilaterally.  Abdominal: Soft. She exhibits no distension. There is no tenderness.  Musculoskeletal: Normal range of motion.  Neurological: She is alert and oriented to person, place, and time.  Skin: Skin is warm and dry.  Psychiatric: She has a normal mood and affect. Judgment normal.  Nursing note and vitals reviewed.   ED Course  Procedures (including critical care time) Labs Review Labs Reviewed  CBC WITH DIFFERENTIAL - Abnormal; Notable for the following:    RBC 3.58 (*)    Hemoglobin 9.9 (*)    HCT 31.9 (*)    All other components within normal limits  COMPREHENSIVE METABOLIC PANEL - Abnormal; Notable for the following:    Sodium 136 (*)    Chloride 95 (*)    Glucose, Bld 161 (*)    BUN 30 (*)    Albumin 3.0 (*)    GFR calc non Af Amer 49 (*)    GFR calc Af Amer 57 (*)    All other components within normal limits  PRO B NATRIURETIC PEPTIDE - Abnormal; Notable for the following:    Pro B Natriuretic peptide (BNP) 7064.0 (*)    All other components within normal limits  TROPONIN I    Imaging Review Dg Chest 2 View  05/02/2014   CLINICAL DATA:  Shortness of breath.  Cough.  Pulmonary fibrosis.  EXAM: CHEST  2 VIEW  COMPARISON:  10/31/2013.  FINDINGS: Severe chronic lung disease with diffuse coarsened interstitial markings. There is been interval increase in the perihilar interstitial markings bilaterally as well as in the posterior lower lobes, left greater right. The cardiomediastinal contours are unchanged with grossly stable cardiomegaly, partially obscured. Minimal blunting of the costophrenic angles, no large pleural effusion. There is no pneumothorax. No acute osseous abnormalities are seen, postsurgical  change again noted right proximal humerus.  IMPRESSION: Extensive chronic lung disease. There is an interval increase in interstitial markings in the perihilar and lower lobes, this may reflect pulmonary edema versus pneumonia.   Electronically Signed   By: Rubye OaksMelanie  Ehinger M.D.   On: 05/02/2014 14:44  I personally reviewed the imaging tests through PACS system I reviewed available ER/hospitalization records through the EMR    EKG Interpretation  Date/Time:  Friday May 02 2014 13:15:02 EST Ventricular Rate:  68 PR Interval:  162 QRS Duration: 68 QT Interval:  412 QTC Calculation: 438 R Axis:   60 Text Interpretation:  Normal sinus rhythm Normal ECG  No significant change was found Reconfirmed by Leslieanne Cobarrubias  MD, Caryn Bee ([id number]) on 05/05/2014 11:48:11 PM        MDM   Final diagnoses:  SOB (shortness of breath)    Pulmonary edema versus pneumonia.  Echo in summer 2015 demonstrated no diastolic dysfunction and normal ejection fraction.  There is a mild elevation of the BMP is 7000.  She has responded to IV diuretics perform the past.  She be given a dose of IV diuretics and this time however given her productive cough and shortness of breath with new oxygen requirement the patient I think would benefit from hospitalization.  I spoke with the pulmonary team who will see the patient in consultation for likely admission.  She does not need admission to the intensive care unit.  She does not need BiPAP or intubation at this time.  IV abx now   Lyanne Co, MD 05/05/14 [id number]

## 2014-05-02 NOTE — Telephone Encounter (Signed)
PLEASE NOTE: All timestamps contained within this report are represented as [place] Standard Time. CONFIDENTIALTY NOTICE: This fax transmission is intended only for the addressee. It contains information that is legally privileged, confidential or otherwise protected from use or disclosure. If you are not the intended recipient, you are strictly prohibited from reviewing, disclosing, copying using or disseminating any of this information or taking any action in reliance on or regarding this information. If you have received this fax in error, please notify us immediately by telephone so that we can arrange for its return to us. Phone: [phone], Toll-Free: [phone], Fax: [phone] Page: 1 of 2 Call Id: [id number] [facility] - Client TELEPHONE ADVICE RECORD [facility] Patient Name: Erica SalonMARY Hansen [facility] Gender: Female DOB: 1923/03/01 Age: 80 Y 7 M 5 D Return Phone Number: [phone] (Primary) Address: [place]State/Zip: [place] [id number] Client [facility] Day Physician Tillman AbideLetvak, Richard Contact Type Call Call Type Triage / Clinical Caller Name Nicholos JohnsKathleen Relationship To Patient Daughter Return Phone Number [phone] (Primary) Chief Complaint BREATHING - fast, heavy or wheezing Initial Comment Caller states her is feeling weaker, bp 194/80, respiration 81, cough and short of breath at rest with oxygen, xfer from office for triage PreDisposition Did not know what to do Nurse Assessment Nurse: Kemper Durielarke, RN, Lurena Joinerebecca Date/Time (Eastern Time): 05/02/2014 10:27:59 AM Confirm and document reason for call. If symptomatic, describe symptoms. ---Caller states her is feeling weaker, bp 194/80, respiration 81, cough and short of breath at rest with oxygen, xfer from office for triage. Has the patient traveled out of the country within the last 30 days? ---Not  Applicable Does the patient require triage? ---Yes Related visit to physician within the last 2 weeks? ---No Does the PT have any chronic conditions? (i.e. diabetes, asthma, etc.) ---Yes List chronic conditions. ---pulmonary fibrosis, diabetes. Guidelines Guideline Title Affirmed Question Affirmed Notes Nurse Date/Time Lamount Cohen(Eastern Time) Breathing Difficulty SEVERE difficulty breathing (e.g., struggling for each breath, speaks in single words) Kemper Durielarke, RN, Lurena Joinerebecca 05/02/2014 10:29:47 AM Disp. Time Lamount Cohen(Eastern Time) Disposition Final User 05/02/2014 10:26:13 AM Send to Urgent Queue Gasper Sellsartin, David 05/02/2014 10:30:16 AM Call EMS 911 Now Yes Kemper Durielarke, RN, Cecil Cobbsebecca Caller Understands: Yes PLEASE NOTE: All timestamps contained within this report are represented as [place] Standard Time. CONFIDENTIALTY NOTICE: This fax transmission is intended only for the addressee. It contains information that is legally privileged, confidential or otherwise protected from use or disclosure. If you are not the intended recipient, you are strictly prohibited from reviewing, disclosing, copying using or disseminating any of this information or taking any action in reliance on or regarding this information. If you have received this fax in error, please notify us immediately by telephone so that we can arrange for its return to us. Phone: [phone], Toll-Free: [phone], Fax: [phone] Page: 2 of 2 Call Id: [id number] Disagree/Comply: Disagree Disagree/Comply Reason: Disagree with instructions Care Advice Given Per Guideline CALL EMS 911 NOW: Immediate medical attention is needed. You need to hang up and call 911 (or an ambulance). Probation officer(Triager Discretion: I'll call you back in a few minutes to be sure you were able to reach them.) CARE ADVICE given per Breathing Difficulty (Adult) guideline. After Care Instructions Given Call Event Type User Date / Time Description

## 2014-05-03 ENCOUNTER — Inpatient Hospital Stay (HOSPITAL_COMMUNITY): Payer: Medicare PPO

## 2014-05-03 DIAGNOSIS — Z9181 History of falling: Secondary | ICD-10-CM

## 2014-05-03 DIAGNOSIS — Z66 Do not resuscitate: Secondary | ICD-10-CM

## 2014-05-03 DIAGNOSIS — J189 Pneumonia, unspecified organism: Secondary | ICD-10-CM

## 2014-05-03 LAB — CBC
HCT: 32.3 % — ABNORMAL LOW (ref 36.0–46.0)
Hemoglobin: 10.1 g/dL — ABNORMAL LOW (ref 12.0–15.0)
MCH: 27.7 pg (ref 26.0–34.0)
MCHC: 31.3 g/dL (ref 30.0–36.0)
MCV: 88.5 fL (ref 78.0–100.0)
PLATELETS: 281 10*3/uL (ref 150–400)
RBC: 3.65 MIL/uL — ABNORMAL LOW (ref 3.87–5.11)
RDW: 15 % (ref 11.5–15.5)
WBC: 10.7 10*3/uL — ABNORMAL HIGH (ref 4.0–10.5)

## 2014-05-03 LAB — BASIC METABOLIC PANEL
ANION GAP: 9 (ref 5–15)
BUN: 32 mg/dL — ABNORMAL HIGH (ref 6–23)
CALCIUM: 9 mg/dL (ref 8.4–10.5)
CO2: 34 mEq/L — ABNORMAL HIGH (ref 19–32)
Chloride: 96 mEq/L (ref 96–112)
Creatinine, Ser: 1.19 mg/dL — ABNORMAL HIGH (ref 0.50–1.10)
GFR calc Af Amer: 46 mL/min — ABNORMAL LOW (ref >90)
GFR calc non Af Amer: 39 mL/min — ABNORMAL LOW (ref >90)
Glucose, Bld: 138 mg/dL — ABNORMAL HIGH (ref 70–99)
Potassium: 3.8 mEq/L (ref 3.7–5.3)
SODIUM: 139 meq/L (ref 137–147)

## 2014-05-03 LAB — TROPONIN I: Troponin I: <0.3 ng/mL (ref <0.30)

## 2014-05-03 LAB — STREP PNEUMONIAE URINARY ANTIGEN: STREP PNEUMO URINARY ANTIGEN: NEGATIVE

## 2014-05-03 LAB — MRSA PCR SCREENING: MRSA by PCR: NEGATIVE

## 2014-05-03 LAB — MAGNESIUM: MAGNESIUM: 1.5 mg/dL (ref 1.5–2.5)

## 2014-05-03 MED ORDER — DEXTROSE 5 % IV SOLN
500.0000 mg | INTRAVENOUS | Status: DC
  Administered 2014-05-03 – 2014-05-04 (×2): 500 mg via INTRAVENOUS
  Filled 2014-05-03 (×2): qty 500

## 2014-05-03 NOTE — Progress Notes (Addendum)
Name: Erica Hansen MRN: [id number] DOB: February 02, 1923    ADMISSION DATE:  05/02/2014  REFERRING MD :  Dr. Patria Maneampos   CHIEF COMPLAINT:  SOB  BRIEF PATIENT DESCRIPTION:  80 y/o F with pulmonary fibrosis (followed by RA) admitted 12/18 with progressive cough & SOB.   SIGNIFICANT EVENTS  12/18  Admit with cough & SOB   STUDIES:  11/08/13  ECHO >> nml LV, nml systolic fxn, EF 78-29%55-60%, no RWMA.  Mild stenosis of AV.  12/18  CXR >> underlying pulmonary fibrosis with increase in interstitial markings (?edema)   SUBJECTIVE:  Pt feels better, less distress  VITAL SIGNS: Temp:  [97.5 F (36.4 C)-98.3 F (36.8 C)] 97.9 F (36.6 C) (12/19 1014) Pulse Rate:  [57-77] 77 (12/19 1014) Resp:  [19-30] 23 (12/19 1014) BP: (152-208)/(49-114) 152/49 mmHg (12/19 1014) SpO2:  [89 %-100 %] 95 % (12/19 1014) Weight:  [50.803 kg (112 lb)] 50.803 kg (112 lb) (12/18 2052)  PHYSICAL EXAMINATION: General:  Frail elderly without overt dyspnea @ rest Neuro:  AAOx4, speech clear, MAE HEENT: WNL, JVP not well visualized Cardiovascular: Reg, + syst M Lungs: diffuse bilateral crackles, minimal scattered wheezes Abdomen: Soft, + BS Ext: symmetric 2+ pretibial edema    Recent Labs Lab 04/28/14 1622 05/02/14 1350 05/03/14 0036  NA 135 136* 139  K 4.4 4.3 3.8  CL 99 95* 96  CO2 30 31 34*  BUN 26* 30* 32*  CREATININE 1.2 0.99 1.19*  GLUCOSE 107* 161* 138*    Recent Labs Lab 04/28/14 1622 05/02/14 1350 05/03/14 0036  HGB 10.4* 9.9* 10.1*  HCT 32.2* 31.9* 32.3*  WBC 10.8* 10.1 10.7*  PLT 353.0 281 281   Dg Chest 2 View  05/02/2014   CLINICAL DATA:  Shortness of breath.  Cough.  Pulmonary fibrosis.  EXAM: CHEST  2 VIEW  COMPARISON:  10/31/2013.  FINDINGS: Severe chronic lung disease with diffuse coarsened interstitial markings. There is been interval increase in the perihilar interstitial markings bilaterally as well as in the posterior lower lobes, left greater right. The cardiomediastinal  contours are unchanged with grossly stable cardiomegaly, partially obscured. Minimal blunting of the costophrenic angles, no large pleural effusion. There is no pneumothorax. No acute osseous abnormalities are seen, postsurgical change again noted right proximal humerus.  IMPRESSION: Extensive chronic lung disease. There is an interval increase in interstitial markings in the perihilar and lower lobes, this may reflect pulmonary edema versus pneumonia.   Electronically Signed   By: Rubye OaksMelanie  Ehinger M.D.   On: 05/02/2014 14:44   Dg Chest Port 1 View  05/03/2014   CLINICAL DATA:  Cough and interstitial pulmonary edema  EXAM: PORTABLE CHEST - 1 VIEW  COMPARISON:  May 02, 2014 and March 19, 2013.  FINDINGS: Chronic changes of pulmonary fibrosis remain diffusely. There is rather equivocal increased opacity in the left lower lobe compared to prior studies. A mild degree of alveolar edema or new consolidation question in the left base. Otherwise the lungs appear stable compared to prior studies. The heart is upper normal in size with pulmonary vascularity within normal limits. No adenopathy. Postoperative change noted in right humerus.  IMPRESSION: Extensive chronic fibrosis. Question mild alveolar edema versus infiltrate left lower lobe. No change in cardiac silhouette.   Electronically Signed   By: Bretta BangWilliam  Woodruff M.D.   On: 05/03/2014 08:12    ASSESSMENT / PLAN: Principal Problem:   CAP (community acquired pneumonia) Active Problems:   Type 2 diabetes mellitus with renal manifestations, controlled  Anxiety   Essential hypertension   PULMONARY FIBROSIS, CHRONIC   Chronic respiratory failure   Chronic kidney disease, stage III (moderate)   Advanced directives, counseling/discussion   Pulmonary edema   DNR (do not resuscitate)   Advanced Pulmonary Fibrosis Suspect Acute Pulmonary Edema due to accelerated HTN LLL CAP on CXR. Purulent sputum  Plan: Cont  Lasix + KCL in am  PRN albuterol    Assess PCT Change rocephin/azithromycin for Cap Tessalon for cough  HTN Trop NEG  Plan: Continue cardizem, losartan-HCTZ, nadolol PRN hydralazine for SBP > 170 ASA  DM   Plan: Monitor glucose on BMP If glucose > 180, add SSI  Carb modified diet   Anxiety   Plan: Continue xanax, remeron  Pt confirms DNR status.  No intubation DCCV or CPR  I updated daughter by phone 05/03/2014   Dorcas Carrowatrick WrightMD Beeper  [phone]  Cell  [phone]  If no response or cell goes to voicemail, call beeper [phone]  05/03/2014, 10:43 AM

## 2014-05-04 DIAGNOSIS — N058 Unspecified nephritic syndrome with other morphologic changes: Secondary | ICD-10-CM

## 2014-05-04 DIAGNOSIS — R296 Repeated falls: Secondary | ICD-10-CM

## 2014-05-04 DIAGNOSIS — J9611 Chronic respiratory failure with hypoxia: Secondary | ICD-10-CM

## 2014-05-04 DIAGNOSIS — E1129 Type 2 diabetes mellitus with other diabetic kidney complication: Secondary | ICD-10-CM

## 2014-05-04 DIAGNOSIS — Z66 Do not resuscitate: Secondary | ICD-10-CM

## 2014-05-04 MED ORDER — AZITHROMYCIN 500 MG PO TABS
500.0000 mg | ORAL_TABLET | Freq: Every day | ORAL | Status: DC
  Filled 2014-05-04: qty 1

## 2014-05-04 NOTE — Progress Notes (Signed)
Physical Therapy Evaluation Patient Details Name: Erica Hansen MRN: [id number] DOB: April 08, 1923 Today's Date: 05/04/2014   History of Present Illness  Patient is an 80 yo female admitted 05/02/14 with SOB.  Patient with CAP, HTN, and mult falls.  PMH: HTN, OA, DM, anxiety, pulmonary fibrosis on O2 at 2 l/min at home, CKD.  Clinical Impression  Patient presents with problems listed below.  Will benefit from acute PT to maximize independence prior to discharge.    Follow Up Recommendations Home health PT;Supervision - Intermittent    Equipment Recommendations  None recommended by PT    Recommendations for Other Services       Precautions / Restrictions Precautions Precautions: Fall Precaution Comments: Prior falls Restrictions Weight Bearing Restrictions: No      Mobility  Bed Mobility Overal bed mobility: Needs Assistance Bed Mobility: Rolling Rolling: Modified independent (Device/Increase time)         General bed mobility comments: Patient able to roll with use of rail and no physical assist.  Required increased time.  Patient refused any further mobility, stating "you're here too soon".  Transfers                    Ambulation/Gait                Stairs            Wheelchair Mobility    Modified Rankin (Stroke Patients Only)       Balance                                             Pertinent Vitals/Pain Pain Assessment: No/denies pain    Home Living Family/patient expects to be discharged to:: Other (Comment) [facility]                 Additional Comments: Per patient and son-in-law, facility provides meals and housekeeping.  Patient recently moved into facility.    Prior Function Level of Independence: Independent with assistive device(s);Needs assistance   Gait / Transfers Assistance Needed: Ambulates with RW.  ADL's / Homemaking Assistance Needed: Daughter and/or aide assists patient with  bathing/dressing in am and preparation for bed in evenings.  Facility provides meals and housekeeping.        Hand Dominance        Extremity/Trunk Assessment   Upper Extremity Assessment: Generalized weakness           Lower Extremity Assessment: Generalized weakness      Cervical / Trunk Assessment: Kyphotic  Communication   Communication: No difficulties  Cognition Arousal/Alertness: Lethargic Behavior During Therapy: Anxious Overall Cognitive Status: Within Functional Limits for tasks assessed                      General Comments      Exercises        Assessment/Plan    PT Assessment Patient needs continued PT services  PT Diagnosis Difficulty walking;Generalized weakness   PT Problem List Decreased strength;Decreased activity tolerance;Decreased balance;Decreased mobility;Decreased knowledge of use of DME;Cardiopulmonary status limiting activity  PT Treatment Interventions DME instruction;Gait training;Functional mobility training;Therapeutic activities;Patient/family education   PT Goals (Current goals can be found in the Care Plan section) Acute Rehab PT Goals Patient Stated Goal: None stated PT Goal Formulation: With patient/family Time For Goal Achievement: 05/11/14 Potential to Achieve Goals: Fair  Frequency Min 3X/week   Barriers to discharge Decreased caregiver support Patient lives alone in ILF.  Does not have 24 hour assist.    Co-evaluation               End of Session Equipment Utilized During Treatment: Oxygen Activity Tolerance: Patient limited by lethargy;Patient limited by fatigue Patient left: in bed;with call bell/phone within reach;with family/visitor present           Time: [id number] PT Time Calculation (min) (ACUTE ONLY): 13 min   Charges:   PT Evaluation $Initial PT Evaluation Tier I: 1 Procedure     PT G CodesVena Austria:          Jazzie Trampe H 05/04/2014, 5:59 PM Durenda HurtSusan H. Renaldo Fiddleravis, PT, [facility] Acute Rehab  Services Pager [phone]

## 2014-05-04 NOTE — Progress Notes (Signed)
Name: Erica Hansen MRN: [id number] DOB: Sep 22, 1922    ADMISSION DATE:  05/02/2014  REFERRING MD :  Dr. Patria Maneampos   CHIEF COMPLAINT:  SOB  BRIEF PATIENT DESCRIPTION:  80 y/o F with pulmonary fibrosis (followed by RA) admitted 12/18 with progressive cough & SOB.   SIGNIFICANT EVENTS  12/18  Admit with cough & SOB   STUDIES:  11/08/13  ECHO >> nml LV, nml systolic fxn, EF 40-98%55-60%, no RWMA.  Mild stenosis of AV.  12/18  CXR >> underlying pulmonary fibrosis with increase in interstitial markings (?edema)   SUBJECTIVE:  Pt feels better, less distress Wants to go home  VITAL SIGNS: Temp:  [97.5 F (36.4 C)-98.3 F (36.8 C)] 98.3 F (36.8 C) (12/20 0925) Pulse Rate:  [71-94] 72 (12/20 0925) Resp:  [17-20] 17 (12/20 0925) BP: (163-182)/(53-60) 170/57 mmHg (12/20 0925) SpO2:  [92 %-96 %] 94 % (12/20 0925) Weight:  [49.6 kg (109 lb 5.6 oz)] 49.6 kg (109 lb 5.6 oz) (12/19 2128)  PHYSICAL EXAMINATION: General:  Frail elderly without overt dyspnea @ rest Neuro:  AAOx4, speech clear, MAE HEENT: WNL, JVP not well visualized Cardiovascular: Reg, + syst M Lungs: diffuse bilateral crackles, minimal scattered wheezes Abdomen: Soft, + BS Ext: symmetric 1+ pretibial edema    Recent Labs Lab 04/28/14 1622 05/02/14 1350 05/03/14 0036  NA 135 136* 139  K 4.4 4.3 3.8  CL 99 95* 96  CO2 30 31 34*  BUN 26* 30* 32*  CREATININE 1.2 0.99 1.19*  GLUCOSE 107* 161* 138*    Recent Labs Lab 04/28/14 1622 05/02/14 1350 05/03/14 0036  HGB 10.4* 9.9* 10.1*  HCT 32.2* 31.9* 32.3*  WBC 10.8* 10.1 10.7*  PLT 353.0 281 281   Dg Chest Port 1 View  05/03/2014   CLINICAL DATA:  Cough and interstitial pulmonary edema  EXAM: PORTABLE CHEST - 1 VIEW  COMPARISON:  May 02, 2014 and March 19, 2013.  FINDINGS: Chronic changes of pulmonary fibrosis remain diffusely. There is rather equivocal increased opacity in the left lower lobe compared to prior studies. A mild degree of alveolar edema  or new consolidation question in the left base. Otherwise the lungs appear stable compared to prior studies. The heart is upper normal in size with pulmonary vascularity within normal limits. No adenopathy. Postoperative change noted in right humerus.  IMPRESSION: Extensive chronic fibrosis. Question mild alveolar edema versus infiltrate left lower lobe. No change in cardiac silhouette.   Electronically Signed   By: Bretta BangWilliam  Woodruff M.D.   On: 05/03/2014 08:12    ASSESSMENT / PLAN: Principal Problem:   CAP (community acquired pneumonia) Active Problems:   Type 2 diabetes mellitus with renal manifestations, controlled   Anxiety   Essential hypertension   PULMONARY FIBROSIS, CHRONIC   Chronic respiratory failure   Chronic kidney disease, stage III (moderate)   Advanced directives, counseling/discussion   Pulmonary edema   DNR (do not resuscitate)   At high risk for falls   Advanced Pulmonary Fibrosis Suspect Acute Pulmonary Edema due to accelerated HTN LLL CAP on CXR. Purulent sputum  Plan:  PRN albuterol  Change rocephin/azithromycin for Cap Tessalon for cough  HTN Trop NEG  Plan: Continue cardizem, losartan-HCTZ, nadolol PRN hydralazine for SBP > 170 ASA  DM   Plan: Monitor glucose on BMP If glucose > 180, add SSI  Carb modified diet   Anxiety   Plan: Continue xanax, remeron  Pt confirms DNR status.  No intubation DCCV or CPR  Son  in law at bedside and updated 05/04/14  Dorcas Carrowatrick WrightMD Beeper  [phone]  Cell  [phone]  If no response or cell goes to voicemail, call beeper [phone]  05/04/2014, 2:34 PM

## 2014-05-05 ENCOUNTER — Telehealth: Payer: Self-pay | Admitting: Internal Medicine

## 2014-05-05 DIAGNOSIS — I10 Essential (primary) hypertension: Secondary | ICD-10-CM

## 2014-05-05 MED ORDER — FUROSEMIDE 40 MG PO TABS
40.0000 mg | ORAL_TABLET | Freq: Every day | ORAL | Status: DC
  Administered 2014-05-05 – 2014-05-06 (×2): 40 mg via ORAL
  Filled 2014-05-05 (×3): qty 1

## 2014-05-05 MED ORDER — LEVOFLOXACIN 750 MG PO TABS
750.0000 mg | ORAL_TABLET | ORAL | Status: DC
  Administered 2014-05-05: 750 mg via ORAL
  Filled 2014-05-05: qty 1

## 2014-05-05 MED ORDER — LEVOFLOXACIN 500 MG PO TABS: 500.0000 mg | ORAL_TABLET | Freq: Every day | ORAL | Status: DC

## 2014-05-05 MED ORDER — LEVOFLOXACIN 750 MG PO TABS: 750.0000 mg | ORAL_TABLET | ORAL | Status: DC

## 2014-05-05 MED ORDER — ALPRAZOLAM 0.25 MG PO TABS: 0.2500 mg | ORAL_TABLET | Freq: Two times a day (BID) | ORAL | Status: AC | PRN

## 2014-05-05 MED ORDER — FUROSEMIDE 20 MG PO TABS: 20.0000 mg | ORAL_TABLET | Freq: Every day | ORAL | Status: DC | PRN

## 2014-05-05 MED ORDER — METHYLPREDNISOLONE SODIUM SUCC 40 MG IJ SOLR
40.0000 mg | Freq: Three times a day (TID) | INTRAMUSCULAR | Status: DC
  Administered 2014-05-05 – 2014-05-06 (×3): 40 mg via INTRAVENOUS
  Filled 2014-05-05 (×7): qty 1

## 2014-05-05 MED ORDER — ALBUTEROL SULFATE (2.5 MG/3ML) 0.083% IN NEBU
2.5000 mg | INHALATION_SOLUTION | RESPIRATORY_TRACT | Status: DC | PRN
Start: 2014-05-05 — End: 2014-09-09

## 2014-05-05 MED ORDER — IPRATROPIUM-ALBUTEROL 0.5-2.5 (3) MG/3ML IN SOLN: 3.0000 mL | Freq: Four times a day (QID) | RESPIRATORY_TRACT | Status: DC

## 2014-05-05 NOTE — Progress Notes (Signed)
Physical Therapy Treatment Patient Details Name: Erica Hansen MRN: [id number] DOB: May 18, 1922 Today's Date: 05/05/2014    History of Present Illness Patient is an 80 yo female admitted 05/02/14 with SOB.  Patient with CAP, HTN, and mult falls.  PMH: HTN, OA, DM, anxiety, pulmonary fibrosis on O2 at 2 l/min at home, CKD.    PT Comments    Patient making slow progress with PT, able to ambulate 26' with min assist.  Patient slightly unsteady with gait.  Recommend patient have supervision 24 hours for safety.  Follow Up Recommendations  Home health PT;Supervision/Assistance - 24 hour     Equipment Recommendations  None recommended by PT    Recommendations for Other Services       Precautions / Restrictions Precautions Precautions: Fall Precaution Comments: Prior falls Restrictions Weight Bearing Restrictions: No    Mobility  Bed Mobility Overal bed mobility: Needs Assistance Bed Mobility: Supine to Sit;Sit to Supine Rolling: Min assist   Supine to sit: Min assist;HOB elevated Sit to supine: Min assist;HOB elevated   General bed mobility comments: Patient requiring assist to bring trunk up to sitting position.  Assist to scoot to edge of bed.  Fair sitting balance with no UE support.  Assist to bring LE's onto bed to return to supine.  Transfers Overall transfer level: Needs assistance Equipment used: Rolling walker (2 wheeled) Transfers: Sit to/from Stand Sit to Stand: Min assist Stand pivot transfers: Mod assist       General transfer comment: Verbal cues for hand placement.  Assist to rise to standing.  Patient able to maintain balance with use of RW.  Ambulation/Gait Ambulation/Gait assistance: Min assist Ambulation Distance (Feet): 26 Feet Assistive device: Rolling walker (2 wheeled) Gait Pattern/deviations: Step-through pattern;Decreased stride length;Shuffle;Leaning posteriorly;Trunk flexed Gait velocity: Decreased Gait velocity interpretation: Below  normal speed for age/gender General Gait Details: Verbal cues for safe use of RW.  Patient with flexed posture and with shuffling gait pattern.  Patient with O2 sats dropping again to 81% with gait on O2 at 6 l/min.   With pursed-lip breathing, patient able to raise O2 sats to 90% in 2 minutes.  Slightly unsteady with gait.   Stairs            Wheelchair Mobility    Modified Rankin (Stroke Patients Only)       Balance Overall balance assessment: Needs assistance         Standing balance support: During functional activity Standing balance-Leahy Scale: Poor Standing balance comment: Leans posteriorly with flexed posture                    Cognition Arousal/Alertness: Awake/alert Behavior During Therapy: Anxious Overall Cognitive Status: Within Functional Limits for tasks assessed                      Exercises      General Comments        Pertinent Vitals/Pain Pain Assessment: No/denies pain    Home Living                      Prior Function            PT Goals (current goals can now be found in the care plan section) Progress towards PT goals: Progressing toward goals    Frequency  Min 3X/week    PT Plan Current plan remains appropriate    Co-evaluation  End of Session Equipment Utilized During Treatment: Gait belt;Oxygen Activity Tolerance: Patient limited by fatigue (O2 sats to 81% on O2 at 6 l/min) Patient left: in bed;with call bell/phone within reach;with bed alarm set     Time: 1335-1400 PT Time Calculation (min) (ACUTE ONLY): 25 min  Charges:  $Gait Training: 8-22 mins $Therapeutic Activity: 8-22 mins                    G Codes:      Vena AustriaDavis, Tekia Waterbury H 05/05/2014, 2:32 PM Durenda HurtSusan H. Renaldo Fiddleravis, PT, [facility] Acute Rehab Services Pager [phone]

## 2014-05-05 NOTE — Discharge Summary (Signed)
Physician Discharge Summary  Patient ID: Erica Hansen MRN: [id number] DOB/AGE: 1922-08-23 80 y.o.  Admit date: 05/02/2014 Discharge date: 05/05/2014    Discharge Diagnoses:  Principal Problem:   CAP (community acquired pneumonia) Active Problems:   Type 2 diabetes mellitus with renal manifestations, controlled   Anxiety   Essential hypertension   PULMONARY FIBROSIS, CHRONIC   Chronic respiratory failure   Chronic kidney disease, stage III (moderate)   Advanced directives, counseling/discussion   Pulmonary edema   DNR (do not resuscitate)   At high risk for falls    Brief Summary: Erica Hansen is a 80 y.o. y/o female with a PMH of pulmonary fibrosis on 2L home O2 (follwed by RA), HTN presented 12/18 with worsening cough and progressive SOB x 2-3 days as well as orthopnea and BLE swelling. Admitted for acute on chronic respiratory failure r/t advanced pulmonary fibrosis with probable CAP c/b pulmonary edema in setting accelerated HTN.  Initial CXR with underlying pulmonary fibrosis and interstitial markings concerning for new PNA.  She was significantly hypertensive in ER with BP 205/114.  She was treated with IV abx, nebulized BD's, diuresis and increased supplemental O2 as well as PRN hydralazine with improvement in HTN.  DNR/DNI status confirmed with pt.  She improved slowly with therapy, near baseline respiratory status and anxious to go home.    SIGNIFICANT EVENTS  12/18 Admit with cough & SOB   STUDIES:  11/08/13 ECHO >> nml LV, nml systolic fxn, EF 45-62%, no RWMA. Mild stenosis of AV.  12/18 CXR >> underlying pulmonary fibrosis with increase in interstitial markings (?edema)                                                                    D/c plan by Discharge Diagnosis  Acute on chronic respiratory failure - r/t Advanced pulmonary fibrosis with probable CAP c/b pulmonary edema in setting accelerated HTN.   CAP  Advanced pulmonary fibrosis  Plan -   Change rocephin/azithro to PO Levaquin for total 7 days -- will get levaquin 713m po QOD given creatinine clearance  Tessalon for cough  Pulmonary hygiene - flutter valve  Supplemental O2 at 4L [place] DNR/DNI  Case management for nebulizer machine - pt feels duonebs "make a big difference"  outpt f/u  Cont home PRN xanax  Low dose pred seems to have helped with cough in the past   HTN  Mild volume overload - likely r/t accelerated HTN (205/114 initially), much improved with gentle diuresis.  PLAN -  Cont previous home rx - cardizem, losartan-hctz, nadolol  Cont lasix PO daily     Filed Vitals:   05/04/14 2129 05/05/14 0125 05/05/14 0600 05/05/14 0825  BP: 148/50  136/55   Pulse: 68  70   Temp: 97.8 F (36.6 C)  98.1 F (36.7 C)   TempSrc: Oral  Oral   Resp: 17  20   Height:      Weight:      SpO2: 94% 93% 94% 95%     Discharge Labs  BMET  Recent Labs Lab 04/28/14 1622 05/02/14 1350 05/03/14 0036  NA 135 136* 139  K 4.4 4.3 3.8  CL 99 95* 96  CO2 30 31 34*  GLUCOSE 107* 161* 138*  BUN 26* 30* 32*  CREATININE 1.2 0.99 1.19*  CALCIUM 9.2 9.4 9.0  MG  --   --  1.5     CBC   Recent Labs Lab 04/28/14 1622 05/02/14 1350 05/03/14 0036  HGB 10.4* 9.9* 10.1*  HCT 32.2* 31.9* 32.3*  WBC 10.8* 10.1 10.7*  PLT 353.0 281 281   Anti-Coagulation No results for input(s): INR in the last 168 hours.           Follow-up Information    Follow up with Viviana Simpler, MD. Schedule an appointment as soon as possible for a visit in 2 weeks.   Specialties:  Internal Medicine, Pediatrics   Contact information:   [place] [address] [phone]       Follow up with Rigoberto Noel., MD On 05/26/2014.   Specialty:  Pulmonary Disease   Why:  11:45am    Contact information:   [address] [address] [phone]          Medication List    STOP taking these medications        ciprofloxacin 500 MG tablet   Commonly known as:  CIPRO      TAKE these medications        albuterol 108 (90 BASE) MCG/ACT inhaler  Commonly known as:  PROVENTIL HFA;VENTOLIN HFA  Inhale 2 puffs into the lungs every 6 (six) hours as needed for wheezing or shortness of breath.     albuterol (2.5 MG/3ML) 0.083% nebulizer solution  Commonly known as:  PROVENTIL  Take 3 mLs (2.5 mg total) by nebulization every 3 (three) hours as needed for wheezing.     ALPRAZolam 0.25 MG tablet  Commonly known as:  XANAX  Take 1 tablet (0.25 mg total) by mouth 2 (two) times daily as needed for anxiety.     aspirin EC 81 MG tablet  Take 81 mg by mouth daily.     benzonatate 200 MG capsule  Commonly known as:  TESSALON  Take 1 capsule (200 mg total) by mouth 3 (three) times daily as needed for cough.     CENTRUM SILVER PO  Take 1 capsule by mouth at bedtime.     CRANBERRY EXTRACT PO  Take 2 tablets by mouth at bedtime.     diltiazem 120 MG 24 hr capsule  Commonly known as:  CARDIZEM CD  Take 1 capsule (120 mg total) by mouth daily.     famotidine 20 MG tablet  Commonly known as:  PEPCID  Take 1 tablet (20 mg total) by mouth at bedtime.     FLUTTER Devi  Use as directed.     furosemide 20 MG tablet  Commonly known as:  LASIX  Take 1 tablet (20 mg total) by mouth daily as needed for fluid.     glucose blood test strip  Commonly known as:  TRUETEST TEST  Use to test blood sugar once daily dx: 250.00     ipratropium-albuterol 0.5-2.5 (3) MG/3ML Soln  Commonly known as:  DUONEB  Take 3 mLs by nebulization every 6 (six) hours.     levofloxacin 750 MG tablet  Commonly known as:  LEVAQUIN  Take 1 tablet (750 mg total) by mouth every other day.     losartan-hydrochlorothiazide 100-12.5 MG per tablet  Commonly known as:  HYZAAR  Take 1 tablet by mouth daily.     mirtazapine 15 MG tablet  Commonly known as:  REMERON  TAKE 1 TABLET (15  MG TOTAL) BY MOUTH AT BEDTIME.     nadolol 80 MG tablet  Commonly known as:   CORGARD  Take 1 tablet (80 mg total) by mouth daily.     polyethylene glycol packet  Commonly known as:  MIRALAX / GLYCOLAX  Take 17 g by mouth daily.     SYSTANE 0.4-0.3 % Soln  Generic drug:  Polyethyl Glycol-Propyl Glycol  Place 2 drops into both eyes at bedtime as needed (dry eyes).     TYLENOL ARTHRITIS PAIN PO  Take 1-2 tablets by mouth 2 (two) times daily as needed (pain).          Disposition: Home - independent living   Discharged Condition: Nykayla Marcelli has met maximum benefit of inpatient care and is medically stable and cleared for discharge.  Patient is pending follow up as above.      Time spent on disposition:  Greater than 35 minutes.   SignedDarlina Sicilian, NP 05/05/2014  9:43 AM Pager: [phone] or [phone]  Physician Statement:   The Patient was personally examined, the discharge assessment and plan has been personally reviewed and I agree with ACNP 's assessment and plan. > 30 minutes of time have been dedicated to discharge assessment, planning and discharge instructions.   Discussed with daughter Juliann Pulse. Was started on nebs -doubt much benefit but wil continue. Also low dose prednisone 5 -10 mg for cough & daily lasix.  Rigoberto Noel MD

## 2014-05-05 NOTE — Progress Notes (Addendum)
   Name: Erica Hansen MRN: [id number] DOB: Sep 10, 1922    ADMISSION DATE:  05/02/2014  REFERRING MD :  Dr. Patria Maneampos   CHIEF COMPLAINT:  SOB  BRIEF PATIENT DESCRIPTION:  80 y/o F with pulmonary fibrosis (followed by RA) admitted 12/18 with progressive cough & SOB.   SIGNIFICANT EVENTS  12/18  Admit with cough & SOB   STUDIES:  11/08/13  ECHO >> nml LV, nml systolic fxn, EF 40-98%55-60%, no RWMA.  Mild stenosis of AV.  12/18  CXR >> underlying pulmonary fibrosis with increase in interstitial markings (?edema)   SUBJECTIVE:  Pt feels better, less distress Wants to go home  VITAL SIGNS: Temp:  [97.8 F (36.6 C)-98.9 F (37.2 C)] 98.2 F (36.8 C) (12/21 1034) Pulse Rate:  [68-72] 72 (12/21 1034) Resp:  [17-20] 18 (12/21 1034) BP: (133-157)/(50-68) 157/53 mmHg (12/21 1034) SpO2:  [93 %-95 %] 94 % (12/21 1034)  PHYSICAL EXAMINATION: General:  Frail elderly without overt dyspnea @ rest Neuro:  AAOx4, speech clear, MAE HEENT: WNL, JVP not well visualized Cardiovascular: Reg, + syst M Lungs: diffuse bilateral crackles, minimal scattered wheezes Abdomen: Soft, + BS Ext: symmetric 1+ pretibial edema    Recent Labs Lab 04/28/14 1622 05/02/14 1350 05/03/14 0036  NA 135 136* 139  K 4.4 4.3 3.8  CL 99 95* 96  CO2 30 31 34*  BUN 26* 30* 32*  CREATININE 1.2 0.99 1.19*  GLUCOSE 107* 161* 138*    Recent Labs Lab 04/28/14 1622 05/02/14 1350 05/03/14 0036  HGB 10.4* 9.9* 10.1*  HCT 32.2* 31.9* 32.3*  WBC 10.8* 10.1 10.7*  PLT 353.0 281 281   No results found.  ASSESSMENT / PLAN: Principal Problem:   CAP (community acquired pneumonia) Active Problems:   Type 2 diabetes mellitus with renal manifestations, controlled   Anxiety   Essential hypertension   PULMONARY FIBROSIS, CHRONIC   Chronic respiratory failure   Chronic kidney disease, stage III (moderate)   Advanced directives, counseling/discussion   Pulmonary edema   DNR (do not resuscitate)   At high risk for  falls  Acute hypoxic resp failure Advanced Pulmonary Fibrosis Suspect Acute Pulmonary Edema due to accelerated HTN LLL CAP on CXR. Purulent sputum  Plan:  PRN albuterol  Change rocephin/azithromycin for Cap Tessalon for cough Empiric solumedrol 40 q 8h - she has responded to steroids in the past  HTN Trop NEG  Plan: Continue cardizem,  Nadolol losartan-HCTZ,lasix 40 mg daily PRN hydralazine for SBP > 170 ASA  DM   Plan: Monitor glucose on BMP If glucose > 180, add SSI  Carb modified diet   Anxiety   Plan: Continue xanax, remeron  Pt confirms DNR status.  No intubation DCCV or CPR Higher FIo2 requirements -may delay discharge, d/w daughter she is keen on discharge & would not hold her back   [facility],Erica Hansen V.MD   05/05/2014, 1:46 PM

## 2014-05-05 NOTE — Clinical Social Work Note (Addendum)
[facility] [place]. Nurse case manager [employer] Royal and CSW talked with patient's daughter, Nelda MarseilleCathy Stinehelfer at the bedside and the d/c plan is to go home with daughter and have home health services before going [facility].   Genelle BalVanessa Mylik Pro, MSW, LCSW Licensed Clinical Social Worker Clinical Social Work Department [facility] [phone]

## 2014-05-05 NOTE — Care Management (Signed)
ED CM [facility] from [facility] on 6E concerning patient being discharged this evening needing HHPT arranged, and DME home nebulizer machine and 3n1 chair. Patient has [employer] Medicare which contracts with [employer]. Notified Gregor HamsRebecca RN to make discharging physcian aware equipment will be available tomorrow. Spoke patient's daughter Nicholos JohnsKathleen she is agreeable with plan for discharge tomorrow. CM will follow up with discharge plan in the am.

## 2014-05-05 NOTE — Telephone Encounter (Signed)
Discussed status with daughter [name] Is going home today Diagnosis CAP and diastolic CHF (daughter had not heard this diagnosis before so I explained it) Will arrange follow up next week to be sure she is improving  Lyla SonCarrie, Please call her on her cell to set up appt with me next week ---Mon, Tues or Wed

## 2014-05-05 NOTE — Progress Notes (Signed)
Physical Therapy Treatment Patient Details Name: Ralene OkMary Jean Swarey MRN: [id number] DOB: [birth year] Today's Date: 05/05/2014    History of Present Illness Patient is an [age] yo female admitted 05/02/14 with SOB.  Patient with CAP, HTN, and mult falls.  PMH: HTN, OA, DM, anxiety, pulmonary fibrosis on O2 at 2 l/min at home, CKD.    PT Comments    [facility] with mod assist, with posterior lean in stance.  Decreased balance, and O2 sat decreased to 81% on 6 liters O2.  RN notified and states patient has had decrease in O2 sats with mobility.  Will attempt ambulation later today.  Follow Up Recommendations  Home health PT;Supervision/Assistance - 24 hour     Equipment Recommendations  None recommended by PT    Recommendations for Other Services       Precautions / Restrictions Precautions Precautions: Fall Precaution Comments: Prior falls Restrictions Weight Bearing Restrictions: No    Mobility  Bed Mobility Overal bed mobility: Needs Assistance Bed Mobility: Supine to Sit;Sit to Supine Rolling: Min assist   Supine to sit: Min assist;HOB elevated Sit to supine: Min assist;HOB elevated   General bed mobility comments: Patient requiring assist to bring trunk up to sitting position.  Assist to scoot to edge of bed.  Fair sitting balance with no UE support.  Assist to bring LE's onto bed to return to supine.  Transfers Overall transfer level: Needs assistance Equipment used: None Transfers: Sit to/from [employer];Stand Pivot Transfers Sit to Stand: Mod assist Stand pivot transfers: Mod assist       General transfer comment: Verbal cues for hand placement.  Patient requiring mod assist to move to standing and for balance.  Patient with posterior lean and flexed posture.  Patient requiring mod assist to take steps to pivot bed <> BSC to void.  Poor balance in stance.  O2 sats dropped to 81% with transfer on 6 liters O2.  Returned to bed and RN notified.  RN  reports this is how patient has been responding with activity.  Ambulation/Gait                 Stairs            Wheelchair Mobility    Modified Rankin (Stroke Patients Only)       Balance Overall balance assessment: Needs assistance         Standing balance support: Bilateral upper extremity supported Standing balance-Leahy Scale: Poor Standing balance comment: Leans posteriorly with flexed posture                    Cognition Arousal/Alertness: Awake/alert Behavior During Therapy: Anxious Overall Cognitive Status: Within Functional Limits for tasks assessed                      Exercises      General Comments        Pertinent Vitals/Pain Pain Assessment: No/denies pain    Home Living                      Prior Function            PT Goals (current goals can now be found in the care plan section) Progress towards PT goals: Progressing toward goals    Frequency  Min 3X/week    PT Plan Discharge plan needs to be updated    Co-evaluation  End of Session Equipment Utilized During Treatment: Gait belt;Oxygen Activity Tolerance: Patient limited by fatigue (O2 sats to 81% on O2 at 6 l/min) Patient left: in bed;with call bell/phone within reach;with bed alarm set     Time: 1206-1229 PT Time Calculation (min) (ACUTE ONLY): 23 min  Charges:  $Therapeutic Activity: 23-37 mins                    G Codes:      Vena AustriaDavis, Batya Citron H 05/05/2014, 2:21 PM Durenda HurtSusan H. Renaldo Fiddleravis, PT, [facility] Acute Rehab Services Pager [phone]

## 2014-05-05 NOTE — Progress Notes (Signed)
Spoke with Dr. Delford FieldWright, per CM home equipment will not be ready until in am.  Patient to stay at hospital overnight and discharge in am.

## 2014-05-06 DIAGNOSIS — J9601 Acute respiratory failure with hypoxia: Secondary | ICD-10-CM

## 2014-05-06 LAB — LEGIONELLA ANTIGEN, URINE

## 2014-05-06 NOTE — Progress Notes (Signed)
Utilization Review Completed.  

## 2014-05-06 NOTE — Care Management Note (Signed)
CARE MANAGEMENT NOTE 05/06/2014  Patient:  Erica Hansen,Erica Hansen   Account Number:  [id number]  Date Initiated:  05/05/2014  Documentation initiated by:  Markelle Najarian  Subjective/Objective Assessment:   CM following for progression and d/c planning.     Action/Plan:   Spoke to pt who is very weak, re need for Neb. Pt working with PT, unclear to this CM if she could return to Indep living, await PT eval.  05/06/2014 Pt for d/c today, with HHRN and HHPT. HH neb to be delivered to pt home.   Anticipated DC Date:  05/06/2014   Anticipated DC Plan:  HOME W HOME HEALTH SERVICES         Choice offered to / List presented to:     DME arranged  NEBULIZER MACHINE      DME agency  [facility]     HH arranged  HH-1 RN  HH-2 PT      [facility]   Status of service:  Completed, signed off Medicare Important Message given?  YES (If response is "NO", the following Medicare IM given date fields will be blank) Date Medicare IM given:  05/05/2014 Medicare IM given by:  Jaeshawn Silvio Date Additional Medicare IM given:   Additional Medicare IM given by:    Discharge Disposition:  HOME W HOME HEALTH SERVICES  Per UR Regulation:    If discussed at Long Length of Stay Meetings, dates discussed:    Comments:

## 2014-05-06 NOTE — Progress Notes (Signed)
Patient provided with discharge information including instruction on activity level, follow up appointments, and medications. Pt and family given time to ask questions and verbalized understanding of all instruction. IV was dc'd without complication. VSS. Pt escorted out by this RN via wheelchair.   Carrie MewJasmine Shaana Acocella, RN

## 2014-05-12 ENCOUNTER — Encounter: Payer: Self-pay | Admitting: Internal Medicine

## 2014-05-12 ENCOUNTER — Encounter: Payer: Self-pay | Admitting: *Deleted

## 2014-05-12 ENCOUNTER — Encounter: Payer: Self-pay | Admitting: Pulmonary Disease

## 2014-05-12 ENCOUNTER — Ambulatory Visit (INDEPENDENT_AMBULATORY_CARE_PROVIDER_SITE_OTHER): Payer: Medicare PPO | Admitting: Internal Medicine

## 2014-05-12 VITALS — BP 120/60 | HR 65 | Temp 98.0°F | Resp 24 | Wt 108.0 lb

## 2014-05-12 DIAGNOSIS — J189 Pneumonia, unspecified organism: Secondary | ICD-10-CM

## 2014-05-12 DIAGNOSIS — J9611 Chronic respiratory failure with hypoxia: Secondary | ICD-10-CM

## 2014-05-12 DIAGNOSIS — I5032 Chronic diastolic (congestive) heart failure: Secondary | ICD-10-CM | POA: Insufficient documentation

## 2014-05-12 DIAGNOSIS — J8410 Pulmonary fibrosis, unspecified: Secondary | ICD-10-CM

## 2014-05-12 NOTE — Assessment & Plan Note (Addendum)
Will get her on hospice after discussion Oxygen in 50% range off the oxygen briefly  [facility] [address]

## 2014-05-12 NOTE — Progress Notes (Signed)
Pre visit review using our clinic review tool, if applicable. No additional management support is needed unless otherwise documented below in the visit note. 

## 2014-05-12 NOTE — Progress Notes (Signed)
Subjective:    Patient ID: Erica Hansen, female    DOB: [birth year], 80 y.o.   MRN: [id number]  HPI Here with daughter and son [name](Tim) for hospital follow up Had discussed her status with daughter while she was in hospital and upon discharge Discharge summary reviewed again  Moved back into daughter's house for now--since discharge [facility]  Probable CAP diagnosed---though difficult with baseline CXR abnormalities levaquin course is finished now No fever Some cough--- back to her usual now Cough is mostly non productive  BP was very high upon admission May have been related to dyspnea, insomnia and anxiety ?? Diastolic CHF Some fluid overload noted Now back on her furosemide  They are trying to alleviate some of her anxiety Was getting alprazolam in hospital--but caused it too much sedation  now has only used 1/2 since discharge  She is worried about dying Discussed hospice and she is interested  Current Outpatient Prescriptions on File Prior to Visit  Medication Sig Dispense Refill  . Acetaminophen (TYLENOL ARTHRITIS PAIN PO) Take 1-2 tablets by mouth 2 (two) times daily as needed (pain).     Marland Kitchen. albuterol (PROVENTIL HFA;VENTOLIN HFA) 108 (90 BASE) MCG/ACT inhaler Inhale 2 puffs into the lungs every 6 (six) hours as needed for wheezing or shortness of breath. 1 Inhaler 2  . albuterol (PROVENTIL) (2.5 MG/3ML) 0.083% nebulizer solution Take 3 mLs (2.5 mg total) by nebulization every 3 (three) hours as needed for wheezing. 75 mL 12  . ALPRAZolam (XANAX) 0.25 MG tablet Take 1 tablet (0.25 mg total) by mouth 2 (two) times daily as needed for anxiety.  0  . aspirin EC 81 MG tablet Take 81 mg by mouth daily.    . benzonatate (TESSALON) 200 MG capsule Take 1 capsule (200 mg total) by mouth 3 (three) times daily as needed for cough. 45 capsule 3  . CRANBERRY EXTRACT PO Take 2 tablets by mouth at bedtime.    Marland Kitchen. diltiazem (CARDIZEM CD) 120 MG 24 hr capsule Take 1  capsule (120 mg total) by mouth daily. 90 capsule 3  . famotidine (PEPCID) 20 MG tablet Take 1 tablet (20 mg total) by mouth at bedtime. 90 tablet 3  . furosemide (LASIX) 20 MG tablet Take 1 tablet (20 mg total) by mouth daily as needed for fluid. 20 tablet 0  . glucose blood (TRUETEST TEST) test strip Use to test blood sugar once daily dx: 250.00 50 each 1  . ipratropium-albuterol (DUONEB) 0.5-2.5 (3) MG/3ML SOLN Take 3 mLs by nebulization every 6 (six) hours. 360 mL 0  . losartan-hydrochlorothiazide (HYZAAR) 100-12.5 MG per tablet Take 1 tablet by mouth daily. 90 tablet 3  . mirtazapine (REMERON) 15 MG tablet TAKE 1 TABLET (15 MG TOTAL) BY MOUTH AT BEDTIME. 90 tablet 3  . Multiple Vitamins-Minerals (CENTRUM SILVER PO) Take 1 capsule by mouth at bedtime.     . nadolol (CORGARD) 80 MG tablet Take 1 tablet (80 mg total) by mouth daily. 90 tablet 3  . Polyethyl Glycol-Propyl Glycol (SYSTANE) 0.4-0.3 % SOLN Place 2 drops into both eyes at bedtime as needed (dry eyes).     . polyethylene glycol (MIRALAX / GLYCOLAX) packet Take 17 g by mouth daily.    Marland Kitchen. Respiratory Therapy Supplies (FLUTTER) DEVI Use as directed. 1 each 0   No current facility-administered medications on file prior to visit.    Allergies  Allergen Reactions  . Clindamycin     REACTION: diffuse maculopapular rash  .  Delsym [Dextromethorphan]     Diarrhea  . Lisinopril     REACTION: cough  . Penicillins Hives and Rash  . Sulfonamide Derivatives Rash    Past Medical History  Diagnosis Date  . Diverticulosis   . Unspecified essential hypertension   . Osteoarthritis   . Type II or unspecified type diabetes mellitus without mention of complication, not stated as uncontrolled   . Anxiety states   . Lung fibrosis   . Urinary incontinence     Past Surgical History  Procedure Laterality Date  . Abdominal hysterectomy    . Replacement total knee    . Laminectomy    . Humerus fracture surgery      Family History    Problem Relation Age of Onset  . Colon cancer Father     History   Social History  . Marital Status: Widowed    Spouse Name: N/A    Number of Children: 3  . Years of Education: N/A   Occupational History  .     Social History Main Topics  . Smoking status: Never Smoker   . Smokeless tobacco: Never Used  . Alcohol Use: Yes     Comment: occasional  . Drug Use: Not on file  . Sexual Activity: Not on file   Other Topics Concern  . Not on file   Social History Narrative   lives with daughter Erica Hansen(Erica Hansen) Has 2 sons in [place]   Has living will   Daughter is health care POA   DNR order done 04/28/14   No feeding tube if cognitively unaware   Review of Systems Appetite not very good Weight is down 4# since her last visit---??some fluid though No diarrhea    Objective:   Physical Exam  Constitutional: She appears well-developed. No distress.  Neck: Normal range of motion. Neck supple. No thyromegaly present.  Cardiovascular: Regular rhythm.  Exam reveals no gallop.   Gr 3/6 systolic murmur towards apex  Pulmonary/Chest: No respiratory distress. She has no wheezes.  Widespread dry crackles Not tight  Abdominal: Soft. There is no tenderness.  Musculoskeletal: She exhibits no edema.  Lymphadenopathy:    She has no cervical adenopathy.  Psychiatric:  Mildly anxious          Assessment & Plan:

## 2014-05-12 NOTE — Assessment & Plan Note (Signed)
Hard to assess this but no ongoing infection clear cut now Done with the levaquin

## 2014-05-12 NOTE — Assessment & Plan Note (Signed)
Worried about dyspnea and dying Discussed hospice---will go ahead and make referral to [facility]

## 2014-05-12 NOTE — Assessment & Plan Note (Signed)
Had fluid overload associated with brief increased BP Echo ~6 months ago showed AS (though murmur sounds mitral) and early diastolic dysfunction This may have increased Will continue the furosemide

## 2014-05-26 ENCOUNTER — Encounter: Payer: Self-pay | Admitting: Pulmonary Disease

## 2014-05-26 ENCOUNTER — Ambulatory Visit (INDEPENDENT_AMBULATORY_CARE_PROVIDER_SITE_OTHER): Payer: Medicare PPO | Admitting: Pulmonary Disease

## 2014-05-26 VITALS — BP 174/62 | HR 71 | Temp 98.1°F | Ht 60.0 in | Wt 110.4 lb

## 2014-05-26 DIAGNOSIS — J9611 Chronic respiratory failure with hypoxia: Secondary | ICD-10-CM

## 2014-05-26 DIAGNOSIS — E1122 Type 2 diabetes mellitus with diabetic chronic kidney disease: Secondary | ICD-10-CM | POA: Diagnosis not present

## 2014-05-26 DIAGNOSIS — N183 Chronic kidney disease, stage 3 (moderate): Secondary | ICD-10-CM | POA: Diagnosis not present

## 2014-05-26 DIAGNOSIS — J810 Acute pulmonary edema: Secondary | ICD-10-CM

## 2014-05-26 DIAGNOSIS — J189 Pneumonia, unspecified organism: Secondary | ICD-10-CM | POA: Diagnosis not present

## 2014-05-26 DIAGNOSIS — J8410 Pulmonary fibrosis, unspecified: Secondary | ICD-10-CM | POA: Diagnosis not present

## 2014-05-26 MED ORDER — FUROSEMIDE 20 MG PO TABS: 20.0000 mg | ORAL_TABLET | Freq: Every day | ORAL | Status: DC

## 2014-05-26 NOTE — Assessment & Plan Note (Signed)
She requires 2 L continuous oxygen at rest, likely more up to 4 L on activity

## 2014-05-26 NOTE — Assessment & Plan Note (Signed)
Stay on 20 mg of Lasix every day and reassess in 3 months

## 2014-05-26 NOTE — Patient Instructions (Signed)
OK to stop nebs Stay on Lasix 20 mg daily #30 x 2 refills Try pulse O2  #4 setting & recheck satn

## 2014-05-26 NOTE — Progress Notes (Signed)
   Subjective:    Patient ID: Erica Hansen, female    DOB: 10-21-1922, 81 y.o.   MRN: [id number]  HPI  89/F, never smoker for FU of IPF  She was treated with empiric steroids for cough since 06/2008 with good response  She is on O2since 03/2013   Significant tests/ events  PFTs 11/2007 >>FVC 59% - mod restriction with desaturation on exertion.  ANA neg, RA factor neg - esr 71  Pneumovax 2003  03/2008 CT chest >> pattern of fibrosis is most consistent with usual interstitial pneumonia (UIP). Increase in superimposed ground-glass suggests areas of active inflammation.   11/08/13 ECHO >> nml LV, nml systolic fxn, EF 54-30%, no RWMA. Mild stenosis of AV.   Adm 09/2013  for a community-acquired pneumonia complicated by sepsis- elevated BNP at 8000.    05/26/2014  Chief Complaint  Patient presents with  . Follow-up    breathing doing okay.  Right ear pain.  Refill Lasix    Adm 12/18-12/21/15 for hypoxia, htn, treated as  Edema, levaquin x 7ds, duonebs helped, low dose pred 5 mg readded Post hospital follow-up-she appears remarkably improved, although desaturates on pulse O2 and requires 2 L continuous. She is now on hospice care and has moved in with her daughter. Out of facility DO NOT RESUSCITATE noted. Breathing is doing much better, cough is under control No fever, chest pain, orthopnea, n/v/d, discolored mucus.    Review of Systems neg for any significant sore throat, dysphagia, itching, sneezing, nasal congestion or excess/ purulent secretions, fever, chills, sweats, unintended wt loss, pleuritic or exertional cp, hempoptysis, orthopnea pnd or change in chronic leg swelling. Also denies presyncope, palpitations, heartburn, abdominal pain, nausea, vomiting, diarrhea or change in bowel or urinary habits, dysuria,hematuria, rash, arthralgias, visual complaints, headache, numbness weakness or ataxia.     Objective:   Physical Exam  Gen. Pleasant, well-nourished, in no  distress ENT - no lesions, no post nasal drip Neck: No JVD, no thyromegaly, no carotid bruits Lungs: no use of accessory muscles, no dullness to percussion, bibasal one third rales no rhonchi  Cardiovascular: Rhythm regular, heart sounds  normal, no murmurs or gallops, no peripheral edema Musculoskeletal: No deformities, no cyanosis or clubbing        Assessment & Plan:

## 2014-05-26 NOTE — Assessment & Plan Note (Signed)
OK to stop nebs He is now under hospice care

## 2014-05-28 DIAGNOSIS — J9611 Chronic respiratory failure with hypoxia: Secondary | ICD-10-CM | POA: Diagnosis not present

## 2014-05-28 DIAGNOSIS — J1189 Influenza due to unidentified influenza virus with other manifestations: Secondary | ICD-10-CM | POA: Diagnosis not present

## 2014-05-28 DIAGNOSIS — I5032 Chronic diastolic (congestive) heart failure: Secondary | ICD-10-CM | POA: Diagnosis not present

## 2014-05-28 DIAGNOSIS — J8410 Pulmonary fibrosis, unspecified: Secondary | ICD-10-CM | POA: Diagnosis not present

## 2014-06-03 DIAGNOSIS — I5032 Chronic diastolic (congestive) heart failure: Secondary | ICD-10-CM

## 2014-06-03 DIAGNOSIS — J9611 Chronic respiratory failure with hypoxia: Secondary | ICD-10-CM

## 2014-06-03 DIAGNOSIS — J189 Pneumonia, unspecified organism: Secondary | ICD-10-CM

## 2014-06-03 DIAGNOSIS — J8410 Pulmonary fibrosis, unspecified: Secondary | ICD-10-CM

## 2014-06-12 ENCOUNTER — Ambulatory Visit (INDEPENDENT_AMBULATORY_CARE_PROVIDER_SITE_OTHER): Payer: Medicare Other | Admitting: Internal Medicine

## 2014-06-12 ENCOUNTER — Telehealth: Payer: Self-pay | Admitting: *Deleted

## 2014-06-12 ENCOUNTER — Encounter: Payer: Self-pay | Admitting: Internal Medicine

## 2014-06-12 VITALS — BP 164/62 | HR 71 | Temp 98.6°F | Ht 60.0 in | Wt 110.8 lb

## 2014-06-12 DIAGNOSIS — I5032 Chronic diastolic (congestive) heart failure: Secondary | ICD-10-CM | POA: Diagnosis not present

## 2014-06-12 DIAGNOSIS — E1129 Type 2 diabetes mellitus with other diabetic kidney complication: Secondary | ICD-10-CM | POA: Diagnosis not present

## 2014-06-12 DIAGNOSIS — N058 Unspecified nephritic syndrome with other morphologic changes: Secondary | ICD-10-CM

## 2014-06-12 DIAGNOSIS — J9611 Chronic respiratory failure with hypoxia: Secondary | ICD-10-CM | POA: Diagnosis not present

## 2014-06-12 DIAGNOSIS — J8410 Pulmonary fibrosis, unspecified: Secondary | ICD-10-CM | POA: Diagnosis not present

## 2014-06-12 MED ORDER — GLUCOSE BLOOD VI STRP: ORAL_STRIP | Status: DC

## 2014-06-12 NOTE — Progress Notes (Signed)
Subjective:    Patient ID: Erica Hansen, female    DOB: 1923/03/03, [age] y.o.   MRN: [id number]  HPI Here for follow up of respiratory failure Here with daughter  Very happy with the hospice care Has aide 3 days per week Nurse comes once a week thus far  Has kept up with Dr Violet BaldyAlva duoneb has stopped--hadn't really helped  No sig chest pain Will get vague right sternal sensation--brief No dizziness Still able to walk around--usually uses walker  Checks sugars occasionally 114 today No hypoglycemia  Current Outpatient Prescriptions on File Prior to Visit  Medication Sig Dispense Refill  . Acetaminophen (TYLENOL ARTHRITIS PAIN PO) Take 1-2 tablets by mouth 2 (two) times daily as needed (pain).     Marland Kitchen. albuterol (PROVENTIL) (2.5 MG/3ML) 0.083% nebulizer solution Take 3 mLs (2.5 mg total) by nebulization every 3 (three) hours as needed for wheezing. 75 mL 12  . ALPRAZolam (XANAX) 0.25 MG tablet Take 1 tablet (0.25 mg total) by mouth 2 (two) times daily as needed for anxiety.  0  . aspirin EC 81 MG tablet Take 81 mg by mouth daily.    . benzonatate (TESSALON) 200 MG capsule Take 1 capsule (200 mg total) by mouth 3 (three) times daily as needed for cough. 45 capsule 3  . CRANBERRY EXTRACT PO Take 2 tablets by mouth at bedtime.    Marland Kitchen. diltiazem (CARDIZEM CD) 120 MG 24 hr capsule Take 1 capsule (120 mg total) by mouth daily. 90 capsule 3  . famotidine (PEPCID) 20 MG tablet Take 1 tablet (20 mg total) by mouth at bedtime. 90 tablet 3  . furosemide (LASIX) 20 MG tablet Take 1 tablet (20 mg total) by mouth daily. 30 tablet 2  . glucose blood (TRUETEST TEST) test strip Use to test blood sugar once daily dx: 250.00 50 each 1  . losartan-hydrochlorothiazide (HYZAAR) 100-12.5 MG per tablet Take 1 tablet by mouth daily. 90 tablet 3  . mirtazapine (REMERON) 15 MG tablet TAKE 1 TABLET (15 MG TOTAL) BY MOUTH AT BEDTIME. 90 tablet 3  . Multiple Vitamins-Minerals (CENTRUM SILVER PO) Take 1 capsule by  mouth at bedtime.     . nadolol (CORGARD) 80 MG tablet Take 1 tablet (80 mg total) by mouth daily. 90 tablet 3  . polyethylene glycol (MIRALAX / GLYCOLAX) packet Take 17 g by mouth daily.    Marland Kitchen. Respiratory Therapy Supplies (FLUTTER) DEVI Use as directed. 1 each 0   No current facility-administered medications on file prior to visit.    Allergies  Allergen Reactions  . Clindamycin     REACTION: diffuse maculopapular rash  . Delsym [Dextromethorphan]     Diarrhea  . Lisinopril     REACTION: cough  . Penicillins Hives and Rash  . Sulfonamide Derivatives Rash    Past Medical History  Diagnosis Date  . Diverticulosis   . Unspecified essential hypertension   . Osteoarthritis   . Type II or unspecified type diabetes mellitus without mention of complication, not stated as uncontrolled   . Anxiety states   . Lung fibrosis   . Urinary incontinence     Past Surgical History  Procedure Laterality Date  . Abdominal hysterectomy    . Replacement total knee    . Laminectomy    . Humerus fracture surgery      Family History  Problem Relation Age of Onset  . Colon cancer Father     History   Social History  . Marital Status:  Widowed    Spouse Name: N/A    Number of Children: 3  . Years of Education: N/A   Occupational History  .     Social History Main Topics  . Smoking status: Never Smoker   . Smokeless tobacco: Never Used  . Alcohol Use: Yes     Comment: occasional  . Drug Use: Not on file  . Sexual Activity: Not on file   Other Topics Concern  . Not on file   Social History Narrative   lives with daughter [name]) Has 2 sons in [place]   Has living will   Daughter is health care POA   DNR order done 04/28/14   No feeding tube if cognitively unaware   Review of Systems Appetite is actually better---weight is stable [facility] Sleeps okay but up with the furosemide--takes in AM but still has frequent nocturia    Objective:    Physical Exam  Constitutional: She appears well-developed. No distress.  Neck: Normal range of motion. Neck supple.  Cardiovascular: Normal rate and regular rhythm.  Exam reveals no gallop.   Loud systolic murmur near aortic area  Pulmonary/Chest:  Decreased breath sounds Crackles throughout Some squeaks too  Musculoskeletal:  Trace edema  Lymphadenopathy:    She has no cervical adenopathy.  Psychiatric: She has a normal mood and affect. Her behavior is normal.          Assessment & Plan:

## 2014-06-12 NOTE — Assessment & Plan Note (Signed)
Fluid status is neutral  Continue current meds

## 2014-06-12 NOTE — Assessment & Plan Note (Signed)
Sugars still fine Lab Results  Component Value Date   HGBA1C 7.2* 04/28/2014

## 2014-06-12 NOTE — Assessment & Plan Note (Signed)
Stable now Form for [employer]--has oxygen concentrator On hospice

## 2014-06-12 NOTE — Progress Notes (Signed)
Pre visit review using our clinic review tool, if applicable. No additional management support is needed unless otherwise documented below in the visit note. 

## 2014-06-12 NOTE — Telephone Encounter (Signed)
Per Dr. Alphonsus SiasLetvak, refill test strips

## 2014-06-12 NOTE — Assessment & Plan Note (Signed)
Follows with Dr Vassie LollAlva No Rx for now

## 2014-06-18 ENCOUNTER — Telehealth: Payer: Self-pay | Admitting: Internal Medicine

## 2014-06-18 DIAGNOSIS — Z7689 Persons encountering health services in other specified circumstances: Secondary | ICD-10-CM

## 2014-06-18 MED ORDER — GLUCOSE BLOOD VI STRP: ORAL_STRIP | Status: DC

## 2014-06-18 NOTE — Telephone Encounter (Signed)
Spoke with pt and recent refill of truetest strips did not include the ICD 10 coding. I changed code to ICD 10 and [facility] [address] Pt voiced understanding.

## 2014-06-18 NOTE — Telephone Encounter (Signed)
[facility]: (Pt did not finish address in vm)  Pt needs test strips called in.  She did not ever finish her whole message on the triage nurse line voicemail.  I tried to call patient back with no answer.  Please try to call patient to get more information so we can call in her test strips.

## 2014-06-20 ENCOUNTER — Telehealth: Payer: Self-pay

## 2014-06-20 NOTE — Telephone Encounter (Signed)
Okay to send order I think she just checks daily

## 2014-06-20 NOTE — Telephone Encounter (Signed)
Ryan with [facility] [address] left v/m; the truetest test strips are not covered by pts ins coverage and Ryan request new rx for one touch ultra diabetic meter, test strips and lancets with ICD 10.Please advise.

## 2014-06-23 ENCOUNTER — Encounter: Payer: Self-pay | Admitting: Internal Medicine

## 2014-06-23 MED ORDER — ONETOUCH ULTRA SYSTEM W/DEVICE KIT: 1.0000 | PACK | Freq: Once | Status: DC

## 2014-06-23 MED ORDER — GLUCOSE BLOOD VI STRP: ORAL_STRIP | Status: AC

## 2014-06-23 MED ORDER — ONETOUCH DELICA LANCETS FINE MISC: Status: AC

## 2014-06-23 NOTE — Telephone Encounter (Signed)
The [facility] after speaking with Durenda Ageristan.

## 2014-06-23 NOTE — Telephone Encounter (Signed)
Spoke to [place] and order sent to pharmacy electronically.

## 2014-06-23 NOTE — Telephone Encounter (Signed)
Please check with her pharmacist about which meter and strips are covered. She might be covered under hospice--but diabetic supplies probably aren't.  Okay to send Rx for meter and strips when correct one is figured out

## 2014-06-23 NOTE — Telephone Encounter (Signed)
[facility] [address] [facility]/m requesting status of diabetic supplies.

## 2014-08-05 IMAGING — CT CT HEAD WITHOUT CONTRAST
1 series · 15 of 30 positions shown, 19 images · non-contrast
Comparison: none

REASON FOR EXAM: fall
COMMENTS:

PROCEDURE:     CT  - CT HEAD WITHOUT CONTRAST  - December 03, 2012 [DATE]
RESULT:     Comparison:  None
TECHNIQUE: Multiple axial images from the foramen magnum to the vertex were
obtained without IV contrast.

[Series 2: soft tissue · axial · 0.42mm/px · z∈[-60,+80]mm · 15 of 32 slices shown, 19 images]
[im 2/32  brain]
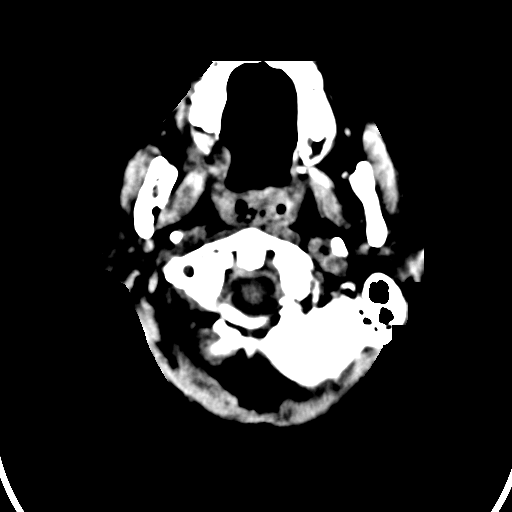
[im 2/32  bone]
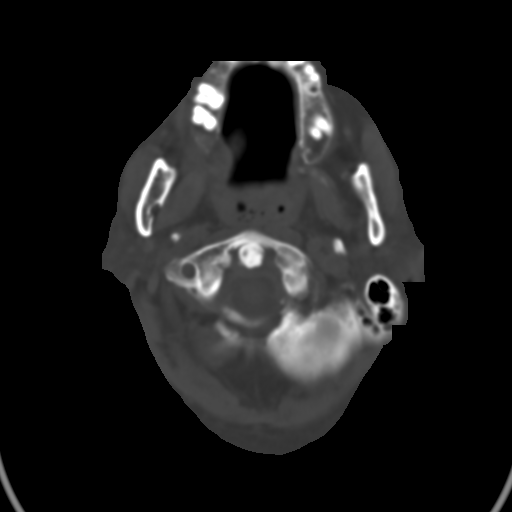
[im 4/32  brain]
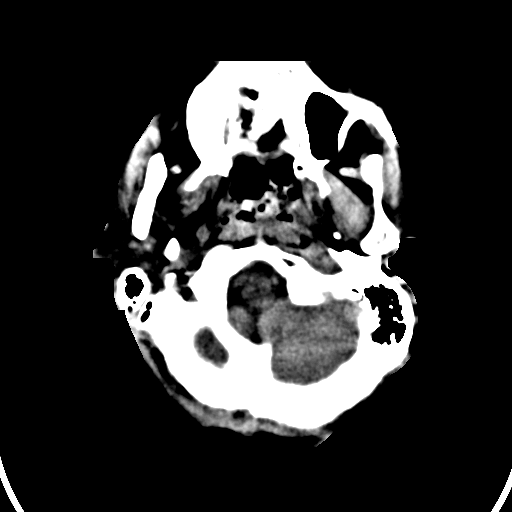
[im 6/32  brain]
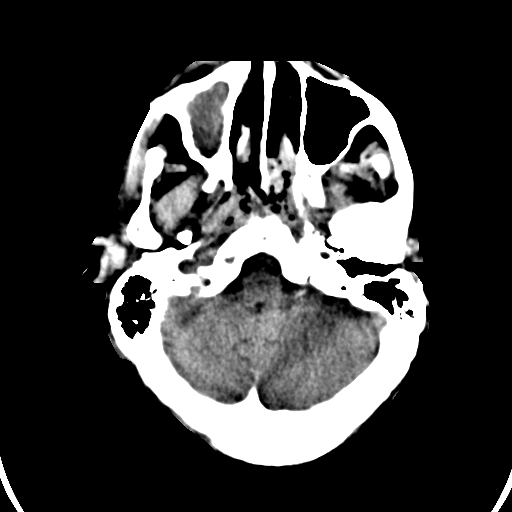
[im 8/32  brain]
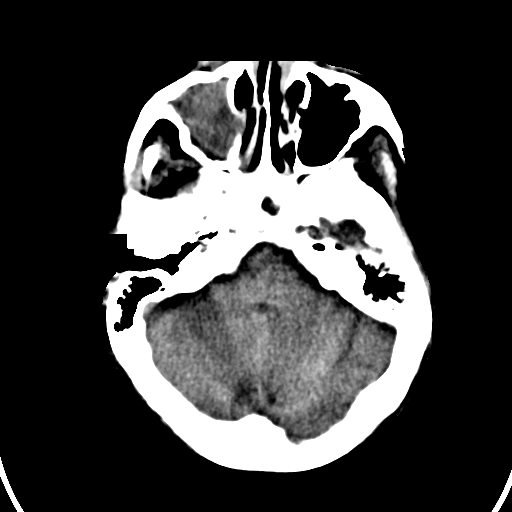
[im 10/32  brain]
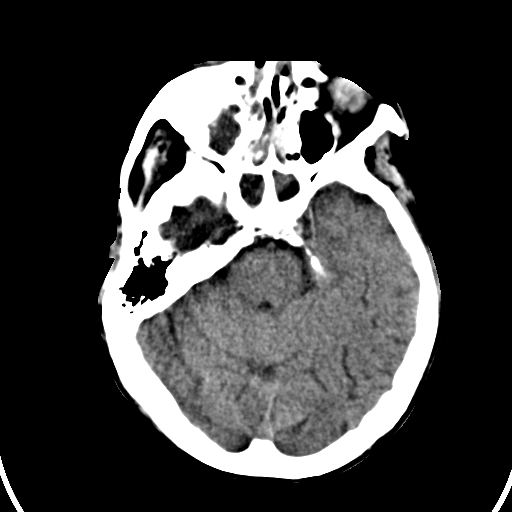
[im 10/32  bone]
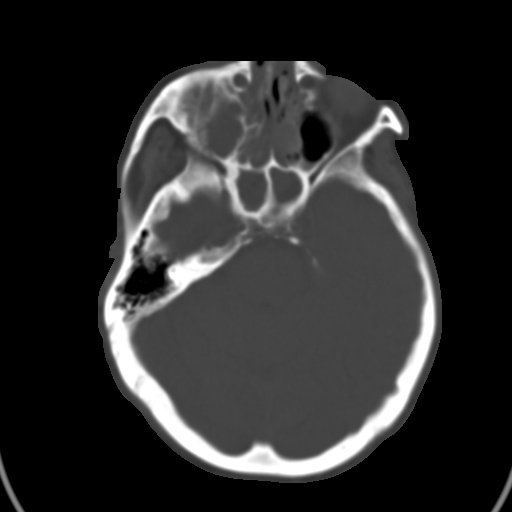
[im 12/32  brain]
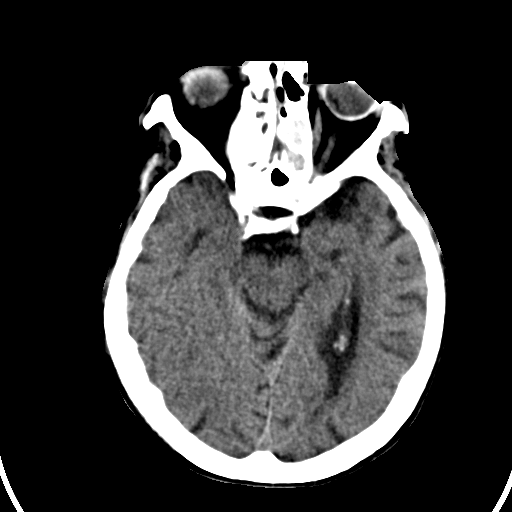
[im 14/32  brain]
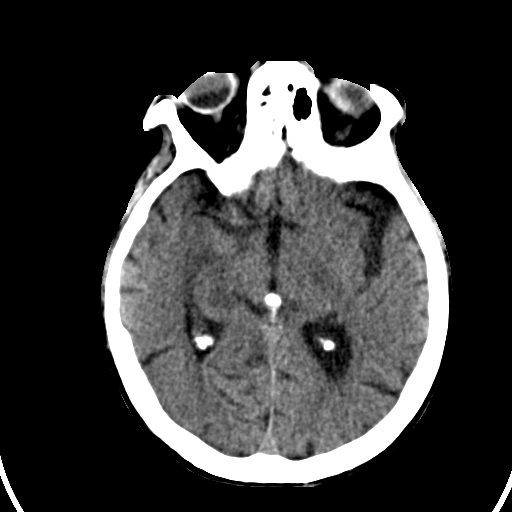
[im 17/32  brain]
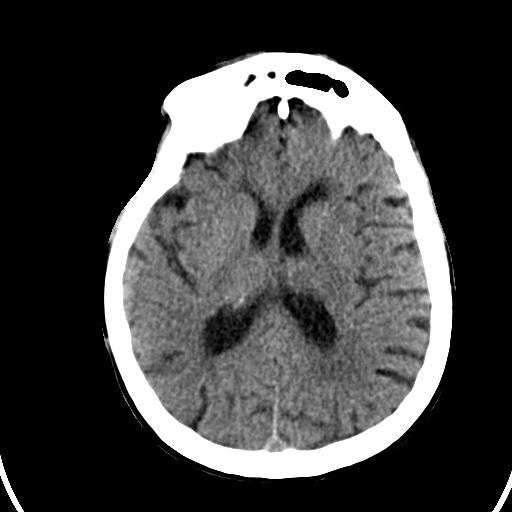
[im 18/32  brain]
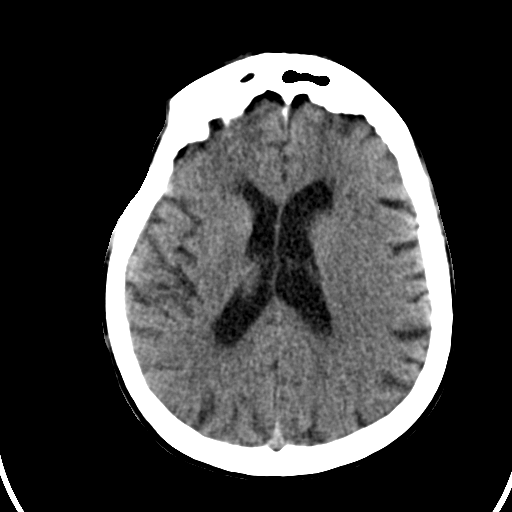
[im 18/32  bone]
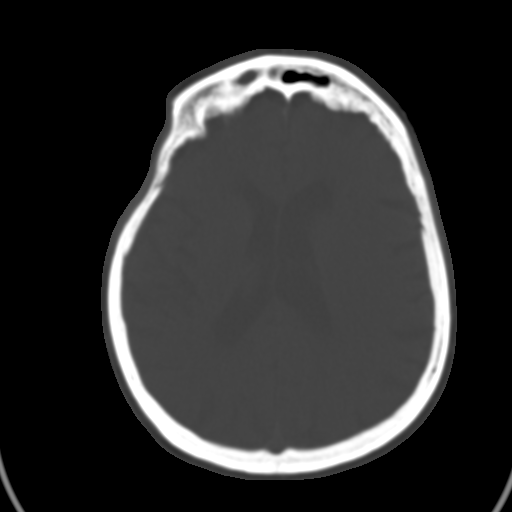
[im 20/32  brain]
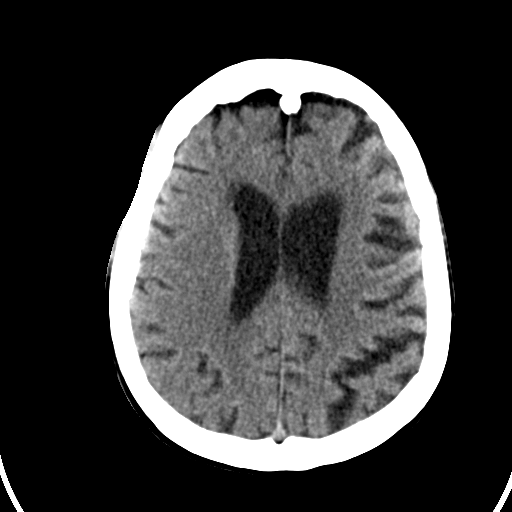
[im 22/32  brain]
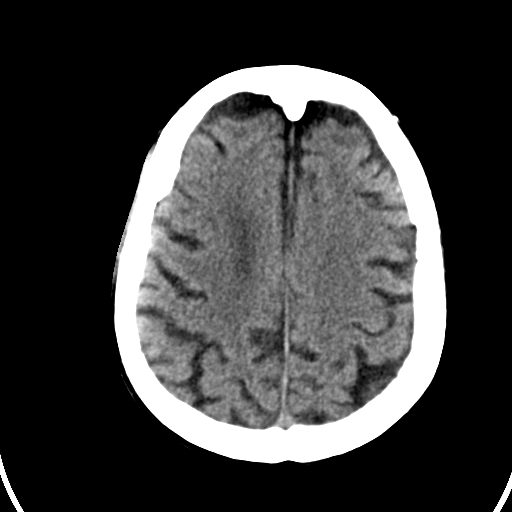
[im 24/32  brain]
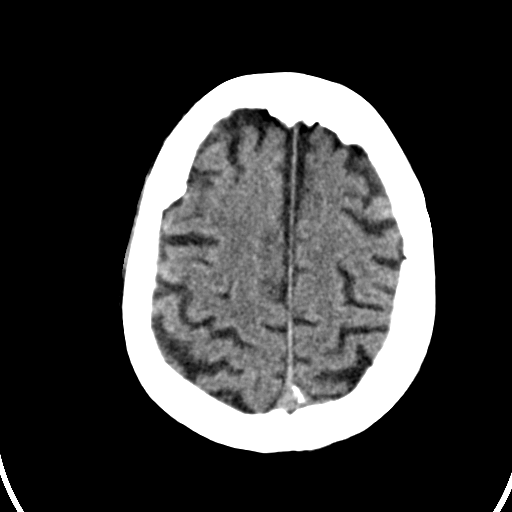
[im 26/32  brain]
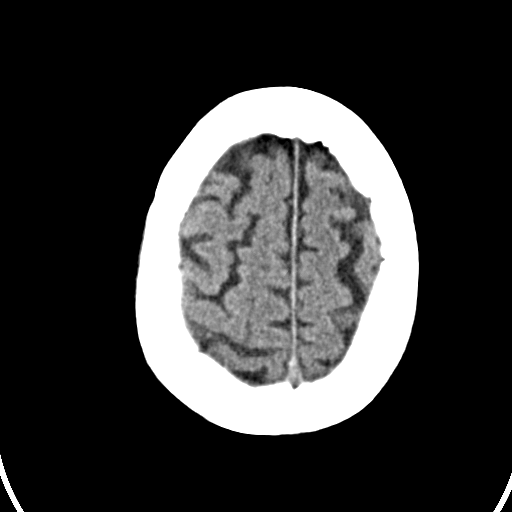
[im 26/32  bone]
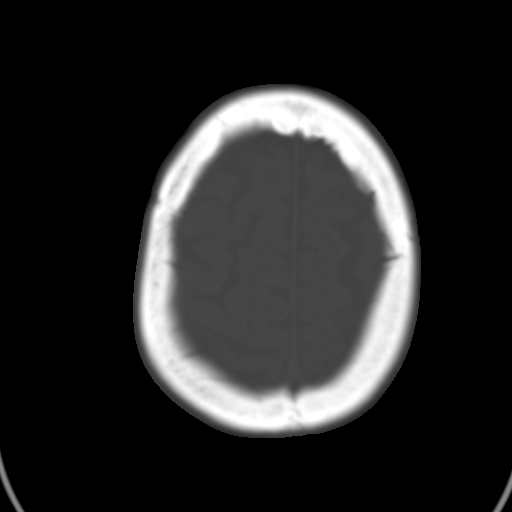
[im 28/32  brain]
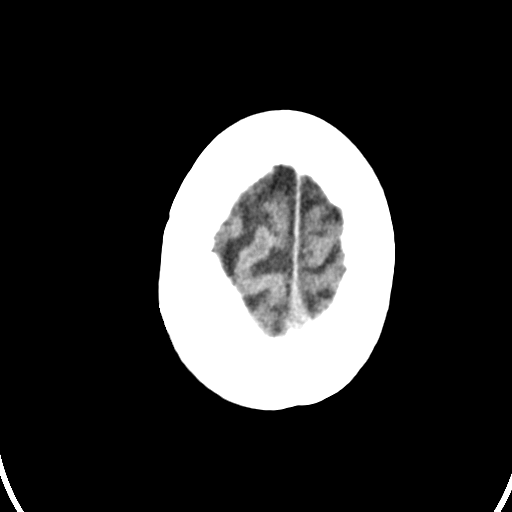
[im 30/32  brain]
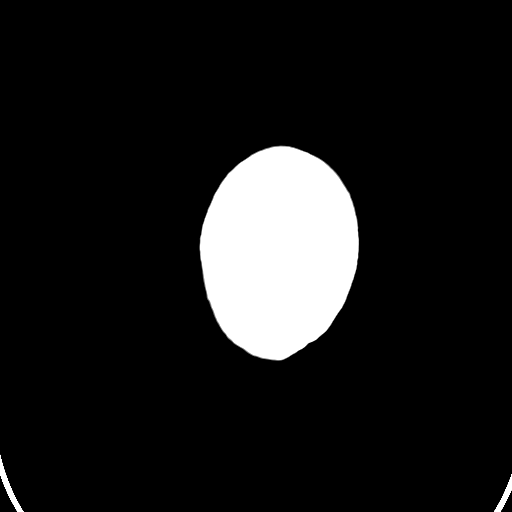

[15 of 30 positions shown; findings below may reference images not displayed]

FINDINGS: There is no evidence of mass effect, midline shift, or extra-axial fluid
collections.  There is no evidence of a space-occupying lesion or
intracranial hemorrhage. There is no evidence of a cortical-based area of
acute infarction. There is generalized cerebral atrophy. There is
periventricular white matter low attenuation likely secondary to
microangiopathy.

The ventricles and sulci are appropriate for the patient's age. The basal
cisterns are patent.

Visualized portions of the orbits are unremarkable. There is complete
opacification of the right frontal sinus, bilateral ethmoid sinuses and
right frontal sinus. There is complete opacification of the sphenoid
sinuses. Cerebrovascular atherosclerotic calcifications are noted.

The osseous structures are unremarkable.
IMPRESSION: No acute intracranial process.

Extensive sinus disease.

[REDACTED]

## 2014-08-17 ENCOUNTER — Encounter: Payer: Self-pay | Admitting: Gastroenterology

## 2014-08-26 DIAGNOSIS — J8410 Pulmonary fibrosis, unspecified: Secondary | ICD-10-CM | POA: Diagnosis not present

## 2014-08-26 DIAGNOSIS — J9611 Chronic respiratory failure with hypoxia: Secondary | ICD-10-CM | POA: Diagnosis not present

## 2014-08-26 DIAGNOSIS — J189 Pneumonia, unspecified organism: Secondary | ICD-10-CM | POA: Diagnosis not present

## 2014-08-26 DIAGNOSIS — I5032 Chronic diastolic (congestive) heart failure: Secondary | ICD-10-CM | POA: Diagnosis not present

## 2014-08-29 ENCOUNTER — Encounter: Payer: Self-pay | Admitting: Internal Medicine

## 2014-09-01 ENCOUNTER — Encounter: Payer: Self-pay | Admitting: Pulmonary Disease

## 2014-09-02 MED ORDER — FUROSEMIDE 20 MG PO TABS: 20.0000 mg | ORAL_TABLET | Freq: Every day | ORAL | Status: DC

## 2014-09-05 NOTE — Consult Note (Signed)
PATIENT NAME:  Erica Hansen, Erica Hansen MR#:  [id number] DATE OF BIRTH:  [birth year]  DATE OF CONSULTATION:  12/03/2012  REFERRING PHYSICIAN:  Jene Every, M.D.  CONSULTING PHYSICIAN:  Bud Face, M.D.  REASON FOR CONSULTATION:  Facial trauma.   HISTORY OF PRESENT ILLNESS: The patient is an 81 year old female who fell early this morning in the bathroom, unknown reason why,  and was found to have a significant facial laceration, through and through, of the upper lip involving the mucosa and vermilion border as well as the philtrum, as well as a laceration of the lower lip that went through the vermilion border. She was also noted to have significant amount of edema and erythema of the nasal bones.   The patient presented into the Emergency Room and I was consulted for laceration repair for facial injuries.   PAST MEDICAL HISTORY: Significant for hypertension, anxiety.   PAST SURGICAL HISTORY: Left knee replacement, right humerus, partial colectomy, hysterectomy.   FAMILY HISTORY: Significant for alcoholism, easy bleeding.   SOCIAL HISTORY: The patient recently moved from [place] and is currently living with her daughter, who is out of town.   ALLERGIES: CLINDAMYCIN, DELSYM, LISINOPRIL, PENICILLIN AND SULFA.   CURRENT MEDICATIONS: Tessalon Perles, terazosin, Systane, Nizoral, nadolol, mirtazapine, diltiazem, Benicar, aspirin and alprazolam.   PHYSICAL EXAMINATION:  VITAL SIGNS: Temperature 98.1. Pulse 72. Respirations 18. Blood pressure 195/97. Pulse ox is 92 on room air.  GENERAL: She is no acute distress, lying supine in bed.   EARS: The external EACs are clear.  NOSE: Some diffuse erythema and bruising of the entire nasal bridge and dorsum. There is no obvious nasal dorsum nor dorsal deformity. She has some ecchymosis, infraorbital region, on her maxillae bilaterally. Intranasally, she has some dried blood. The septum is deviated to the patient's right side. There is no obvious septal hematoma.   ORAL CAVITY AND OROPHARYNX: A significant loss of teeth of the incisors and the canines anteriorly with tooth roots in place. She has a through-and-through laceration involving the entire mucosa of her upper lip, extending past the vermilion border, up into the philtrum. There is some skin and avulsed mucosa of the upper lip. On the lower lip, she has multiple small areas of avulsion, as well as a large central laceration involving the vermilion border.  NECK: Supple. No lymphadenopathy or thyromegaly.   PROCEDURE: Complex laceration repair of upper lip, through and through, with closure in multiple layers, as well as complex closure of upper lip, approximately 10 cm in course and lower lip 4 cm.   DESCRIPTION OF PROCEDURE: After verbal consent was obtained, the patient's upper and lower lips were anesthetized with 5 mL of 1% lidocaine with 1:100,000 epinephrine, and the patient's upper lip and lower lip were prepped and draped in a sterile fashion. The upper lip was cleaned with a Betadine and saline solution, and this demonstrated an area of loss of mucosa of the upper lip on the right side, but no other loss of significant tissue. A small amount of avulsed tissue was trimmed. The lower lip throughout revealed a large laceration of approximately 4 cm that involved the vermilion border.   At this time, deep Vicryl stitches were placed at the level of the vermilion border, and orbicularis oris muscle was reapproximated with multiple deep Vicryl stitches. The vermilion border was reapproximated with a single nylon suture, and then running nylon suture was taken superiorly along the laceration site of squamous skin, and then  interrupted 5-0 chromics were used  for closure of the mucosal surface of the lip. This was carried into the oral vestibule and up until the frenulum of the upper lip, where the laceration ended, for a total laceration repair of 10 cm.   At this time, attention was directed to the  patient's lower lip. In a similar fashion, deep Vicryls were placed, reapproximating the orbicularis oris muscle, and then the vermilion border was reapproximated with a single 5-0 nylon suture. Interrupted nylons were used to close the skin  inferiorly and chromic was used to close the mucosal surface of the lip superiorly.   At this time, the patient's upper and lower lips were copiously irrigated with sterile saline and Betadine. Other small lacerations of the nasal bridge as well as the lower lip were deemed too  superficial to close with suture and were cleaned, and the area was dressed with bacitracin ointment.   Evaluation of the patient's oral cavity revealed multiple fractured teeth, but no significant laceration of the gingiva or gums.   At this time, the care of the patient was transferred to the ED.   DIAGNOSTIC STUDIES: CT scan was reviewed, which reveals a comminuted nasal bone fracture and sinus opacifications, as well as a right-sided septal deviation.   IMPRESSION: A comminuted nasal bone fracture and facial laceration, status post complex repair, upper lip of 10 cm and lower lip of 4 cm, and closed in multiple layers.   PLAN: Some bacitracin ointment. I will decide as far as what antibiotic the patient is able to take, as she is allergic to CLINDAMYCIN, PENICILLIN and SULFA. She does need some antibiotic coverage, given the extensive blood in her sinuses.   I have recommended salt water irrigation and I will see her back on Friday for suture removal.    ____________________________ Kyung Ruddreighton C. Phelix Fudala, MD ccv:np D: 12/03/2012 13:45:00 ET T: 12/03/2012 14:45:27 ET JOB#: [id number]  cc: Kyung Ruddreighton C. Jarrod Mcenery, MD, <Dictator>   Kyung RuddREIGHTON C Lennis Rader MD ELECTRONICALLY SIGNED 12/12/2012 13:42

## 2014-09-08 ENCOUNTER — Ambulatory Visit: Payer: Medicare PPO | Admitting: Internal Medicine

## 2014-09-09 ENCOUNTER — Encounter: Payer: Self-pay | Admitting: Pulmonary Disease

## 2014-09-09 ENCOUNTER — Ambulatory Visit (INDEPENDENT_AMBULATORY_CARE_PROVIDER_SITE_OTHER): Admitting: Pulmonary Disease

## 2014-09-09 VITALS — BP 170/64 | HR 67 | Ht 60.0 in | Wt 109.0 lb

## 2014-09-09 DIAGNOSIS — J8410 Pulmonary fibrosis, unspecified: Secondary | ICD-10-CM

## 2014-09-09 DIAGNOSIS — J9611 Chronic respiratory failure with hypoxia: Secondary | ICD-10-CM

## 2014-09-09 NOTE — Progress Notes (Signed)
   Subjective:    Patient ID: Erica Hansen, female    DOB: Apr 16, 1923, 81 y.o.   MRN: [id number]  HPI  89/F, never smoker for FU of IPF  She was treated with empiric steroids for cough since 06/2008 with good response  She is on O2since 03/2013   Significant tests/ events  PFTs 11/2007 >>FVC 59% - mod restriction with desaturation on exertion.  ANA neg, RA factor neg - esr 71  Pneumovax 2003  03/2008 CT chest >> pattern of fibrosis is most consistent with usual interstitial pneumonia (UIP). Increase in superimposed ground-glass suggests areas of activeinflammation.   11/08/13 ECHO >> nml LV, nml systolic fxn, EF 10-68%, no RWMA. Mild stenosis of AV.   Adm 09/2013  for a community-acquired pneumonia - elevated BNP at 8000.  Adm 12/18-12/21/15 for hypoxia, htn, treated as  Edema Placed on hospice care and moved in with her daughter. Out of facility DO NOT RESUSCITATE .  09/09/2014  Chief Complaint  Patient presents with  . Follow-up    Wants to stop taking Lasix.  Feet are still swelling. burning with urination.    6mFU - Her respiratory status is stable- although desaturates on Hansen O2 and requires 3-4 L continuous. Hypoxia does appear to be slightly worse  Breathing is doing much better, cough is under control She is tired taking Lasix every day Daughter Erica Pulseis planning to go to the beach for her 90th birthday No fever, chest pain, orthopnea, n/v/d, discolored mucus. Hospice has definitely helped   Review of Systems  neg for any significant sore throat, dysphagia, itching, sneezing, nasal congestion or excess/ purulent secretions, fever, chills, sweats, unintended wt loss, pleuritic or exertional cp, hempoptysis, orthopnea pnd or change in chronic leg swelling. Also denies presyncope, palpitations, heartburn, abdominal pain, nausea, vomiting, diarrhea or change in bowel or urinary habits, dysuria,hematuria, rash, arthralgias, visual complaints, headache, numbness  weakness or ataxia.     Objective:   Physical Exam  Gen. Pleasant, well-nourished, in no distress, walks with cane ENT - no lesions, no post nasal drip Neck: No JVD, no thyromegaly, no carotid bruits Lungs: no use of accessory muscles, no dullness to percussion, bibasal rales, no rhonchi  Cardiovascular: Rhythm regular, heart sounds  normal, no murmurs or gallops, no peripheral edema Musculoskeletal: No deformities, no cyanosis or clubbing        Assessment & Plan:

## 2014-09-09 NOTE — Assessment & Plan Note (Signed)
Ok to decrease lasix to 20 mg M/W/F OK to go to the beach Happy birthday in advance 

## 2014-09-09 NOTE — Patient Instructions (Signed)
Ok to decrease lasix to 20 mg M/W/F OK to go to the beach Happy birthday in advance

## 2014-09-09 NOTE — Assessment & Plan Note (Signed)
Due to increased to 4-5 L of oxygen on walking

## 2014-09-10 ENCOUNTER — Ambulatory Visit (INDEPENDENT_AMBULATORY_CARE_PROVIDER_SITE_OTHER): Payer: Medicare Other | Admitting: Internal Medicine

## 2014-09-10 ENCOUNTER — Encounter: Payer: Self-pay | Admitting: Internal Medicine

## 2014-09-10 VITALS — BP 162/62 | HR 67 | Temp 98.1°F | Wt 108.0 lb

## 2014-09-10 DIAGNOSIS — J8410 Pulmonary fibrosis, unspecified: Secondary | ICD-10-CM

## 2014-09-10 DIAGNOSIS — N183 Chronic kidney disease, stage 3 unspecified: Secondary | ICD-10-CM

## 2014-09-10 DIAGNOSIS — E1129 Type 2 diabetes mellitus with other diabetic kidney complication: Secondary | ICD-10-CM

## 2014-09-10 DIAGNOSIS — N058 Unspecified nephritic syndrome with other morphologic changes: Secondary | ICD-10-CM

## 2014-09-10 DIAGNOSIS — I5032 Chronic diastolic (congestive) heart failure: Secondary | ICD-10-CM | POA: Diagnosis not present

## 2014-09-10 DIAGNOSIS — J9611 Chronic respiratory failure with hypoxia: Secondary | ICD-10-CM

## 2014-09-10 NOTE — Assessment & Plan Note (Signed)
On ARB  Labs next time

## 2014-09-10 NOTE — Assessment & Plan Note (Signed)
Hypoxic Okay on oxygen

## 2014-09-10 NOTE — Progress Notes (Signed)
Pre visit review using our clinic review tool, if applicable. No additional management support is needed unless otherwise documented below in the visit note. 

## 2014-09-10 NOTE — Assessment & Plan Note (Signed)
Seems to have stabilized some Still with marked functional disability She still fits hospice eligibility as far as I can see

## 2014-09-10 NOTE — Assessment & Plan Note (Signed)
Sugars seem fine Will recheck labs next time

## 2014-09-10 NOTE — Assessment & Plan Note (Signed)
Compensated Neutral fluid status On furosemide 3 days per week now

## 2014-09-10 NOTE — Progress Notes (Signed)
Subjective:    Patient ID: Erica Hansen, female    DOB: 1923-05-12, 81 y.o.   MRN: [id number]  HRalene OkI Here with daughter for follow up of multiple medical conditions  Very happy with Dr Vassie LollAlva Respiratory status is stable Spells of cough persist--but some [facility] Needs assist for bathing and dressing. Hospice aide 3 days per week Hiring extra aide with long term care insurance Does use bathroom herself  Happy with hospice Will be going for respite for a few days while daughter is away Still goes to church with daughter  Had some burning dysuria intermittently No longer having it Tries to drink regularly Did try cranberry gummy  Checks sugars about weekly 122-154 random --not fasting No hypoglycemic reactions  No chest pain No palpitations Some dizziness ---will just sit down and she feels better Chronic edema--mild and may be some better recently  Current Outpatient Prescriptions on File Prior to Visit  Medication Sig Dispense Refill  . Acetaminophen (TYLENOL ARTHRITIS PAIN PO) Take 1-2 tablets by mouth 2 (two) times daily as needed (pain).     Marland Kitchen. ALPRAZolam (XANAX) 0.25 MG tablet Take 1 tablet (0.25 mg total) by mouth 2 (two) times daily as needed for anxiety.  0  . aspirin EC 81 MG tablet Take 81 mg by mouth daily.    . benzonatate (TESSALON) 200 MG capsule Take 1 capsule (200 mg total) by mouth 3 (three) times daily as needed for cough. 45 capsule 3  . CRANBERRY EXTRACT PO Take 2 tablets by mouth at bedtime.    Marland Kitchen. diltiazem (CARDIZEM CD) 120 MG 24 hr capsule Take 1 capsule (120 mg total) by mouth daily. 90 capsule 3  . famotidine (PEPCID) 20 MG tablet Take 1 tablet (20 mg total) by mouth at bedtime. 90 tablet 3  . glucose blood (TRUETEST TEST) test strip Use to test blood sugar once daily dx: E11.29 50 each 2  . losartan-hydrochlorothiazide (HYZAAR) 100-12.5 MG per tablet Take 1 tablet by mouth daily. 90 tablet 3  . mirtazapine (REMERON) 15 MG tablet  TAKE 1 TABLET (15 MG TOTAL) BY MOUTH AT BEDTIME. 90 tablet 3  . Multiple Vitamins-Minerals (CENTRUM SILVER PO) Take 1 capsule by mouth at bedtime.     . nadolol (CORGARD) 80 MG tablet Take 1 tablet (80 mg total) by mouth daily. 90 tablet 3  . ONETOUCH DELICA LANCETS FINE MISC Use daily . Dx E11.29 100 each 0  . polyethylene glycol (MIRALAX / GLYCOLAX) packet Take 17 g by mouth daily.     No current facility-administered medications on file prior to visit.    Allergies  Allergen Reactions  . Clindamycin     REACTION: diffuse maculopapular rash  . Delsym [Dextromethorphan]     Diarrhea  . Lisinopril     REACTION: cough  . Penicillins Hives and Rash  . Sulfonamide Derivatives Rash    Past Medical History  Diagnosis Date  . Diverticulosis   . Unspecified essential hypertension   . Osteoarthritis   . Type II or unspecified type diabetes mellitus without mention of complication, not stated as uncontrolled   . Anxiety states   . Lung fibrosis   . Urinary incontinence     Past Surgical History  Procedure Laterality Date  . Abdominal hysterectomy    . Replacement total knee    . Laminectomy    . Humerus fracture surgery      Family History  Problem Relation Age of Onset  . Colon  cancer Father     History   Social History  . Marital Status: Widowed    Spouse Name: N/A  . Number of Children: 3  . Years of Education: N/A   Occupational History  .     Social History Main Topics  . Smoking status: Never Smoker   . Smokeless tobacco: Never Used  . Alcohol Use: Yes     Comment: occasional  . Drug Use: Not on file  . Sexual Activity: Not on file   Other Topics Concern  . Not on file   Social History Narrative   lives with daughter [name]) Has 2 sons in [place]   Has living will   Daughter is health care POA   DNR order done 04/28/14   No feeding tube if cognitively unaware   Review of Systems Bowels okay with miralax Sleeps okay usually--occ bad  night and does wake at times    Objective:   Physical Exam  Constitutional: She appears well-developed. No distress.  Neck: Normal range of motion. Neck supple.  Cardiovascular: Normal rate and regular rhythm.  Exam reveals no gallop.   Gr 3/6 systolic murmur  Pulmonary/Chest: No respiratory distress. She has no wheezes.  Widespread crackles in both lung fields  Musculoskeletal:  1+ non pitting edema  Lymphadenopathy:    She has no cervical adenopathy.  Psychiatric: She has a normal mood and affect. Her behavior is normal.          Assessment & Plan:

## 2014-09-11 ENCOUNTER — Telehealth: Payer: Self-pay | Admitting: Internal Medicine

## 2014-10-10 ENCOUNTER — Encounter: Payer: Self-pay | Admitting: Pulmonary Disease

## 2014-10-10 ENCOUNTER — Other Ambulatory Visit: Payer: Self-pay | Admitting: *Deleted

## 2014-10-10 MED ORDER — BENZONATATE 200 MG PO CAPS: 200.0000 mg | ORAL_CAPSULE | Freq: Three times a day (TID) | ORAL | Status: AC | PRN

## 2014-10-12 ENCOUNTER — Encounter: Payer: Self-pay | Admitting: Internal Medicine

## 2014-10-14 MED ORDER — FAMOTIDINE 20 MG PO TABS: 20.0000 mg | ORAL_TABLET | Freq: Every day | ORAL | Status: AC

## 2014-10-29 ENCOUNTER — Encounter: Payer: Self-pay | Admitting: Internal Medicine

## 2014-10-30 ENCOUNTER — Ambulatory Visit: Payer: Medicare PPO | Admitting: Internal Medicine

## 2014-11-02 ENCOUNTER — Encounter: Payer: Self-pay | Admitting: Pulmonary Disease

## 2014-11-03 MED ORDER — FUROSEMIDE 20 MG PO TABS: 20.0000 mg | ORAL_TABLET | ORAL | Status: AC

## 2014-11-13 DIAGNOSIS — J189 Pneumonia, unspecified organism: Secondary | ICD-10-CM

## 2014-11-13 DIAGNOSIS — J8410 Pulmonary fibrosis, unspecified: Secondary | ICD-10-CM

## 2014-11-13 DIAGNOSIS — J9611 Chronic respiratory failure with hypoxia: Secondary | ICD-10-CM

## 2014-11-13 DIAGNOSIS — I5032 Chronic diastolic (congestive) heart failure: Secondary | ICD-10-CM | POA: Diagnosis not present

## 2014-11-19 IMAGING — CR DG CHEST 2V
2 series · 2 of 2 positions shown · non-contrast
Comparison: 01/20/2012 and earlier.

CLINICAL DATA: 89-year-old female with post inflammatory pulmonary
fibrosis. Cough congestion and shortness of breath. Initial
encounter.

EXAM:
CHEST  2 VIEW

[view not recorded (1 of 2)]
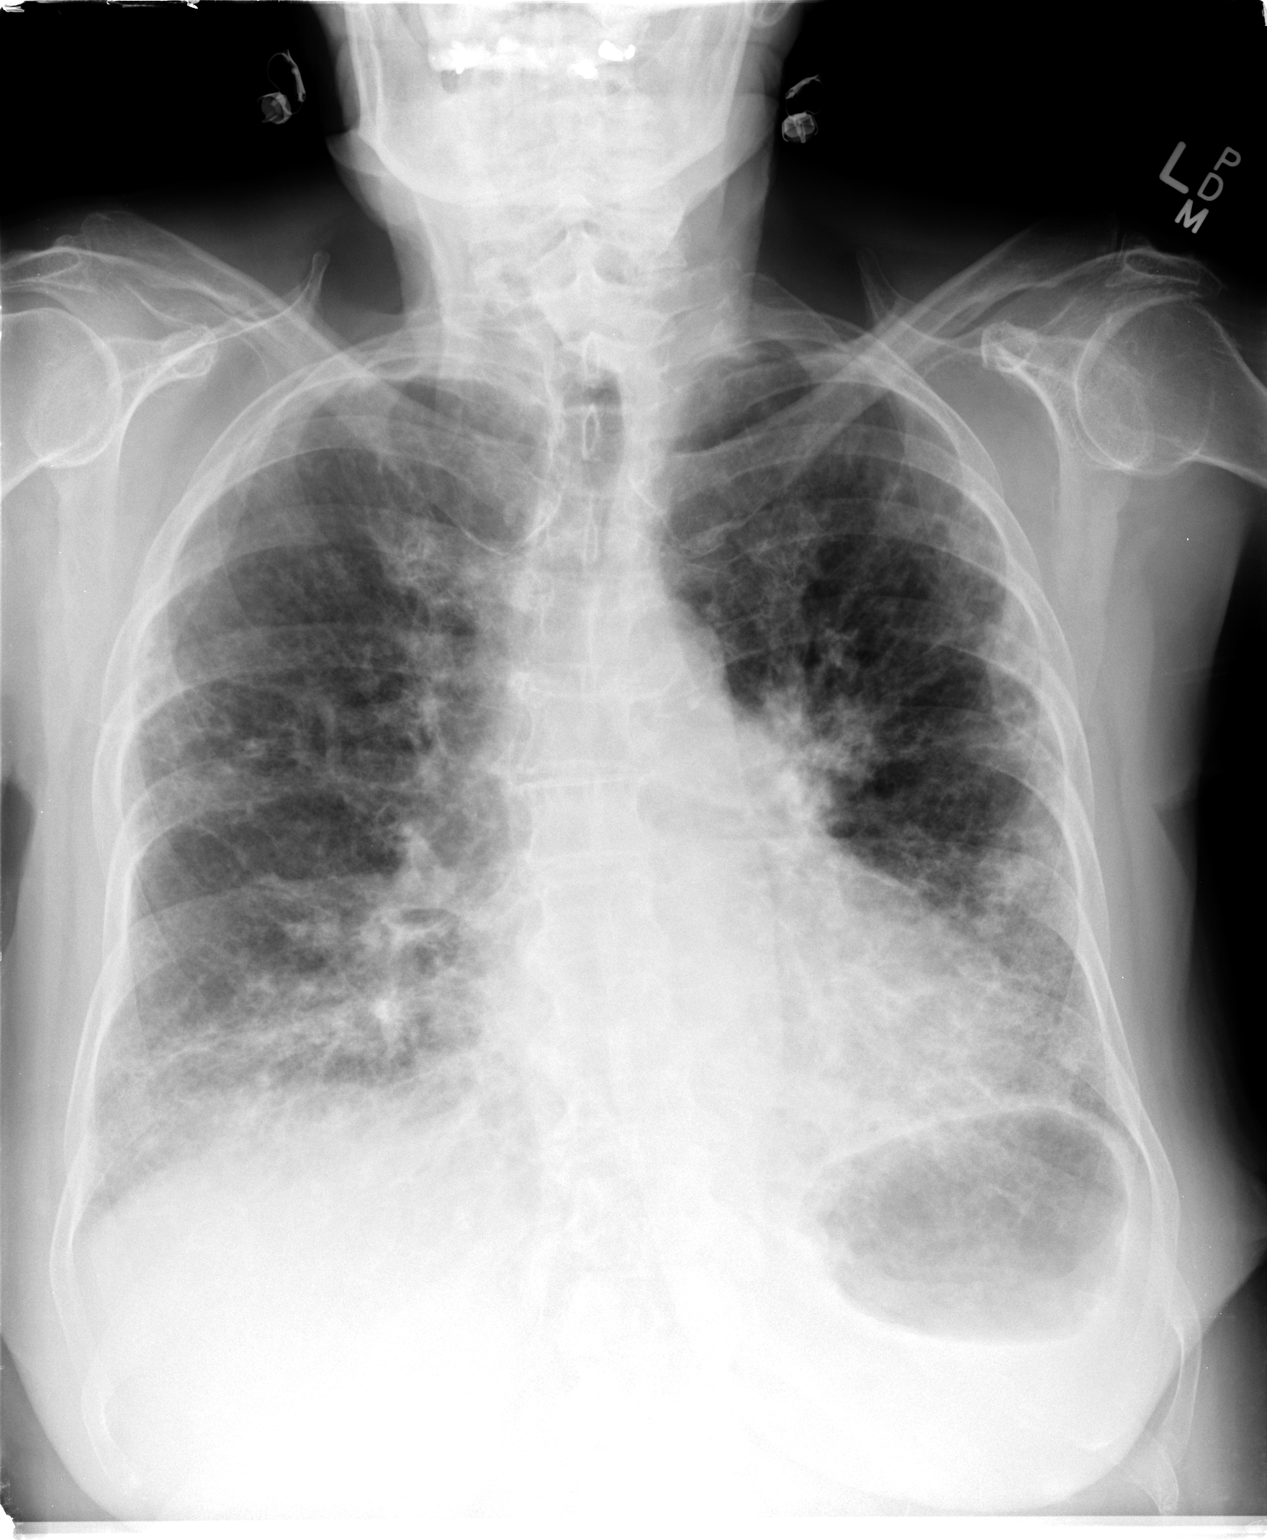

[view not recorded (2 of 2)]
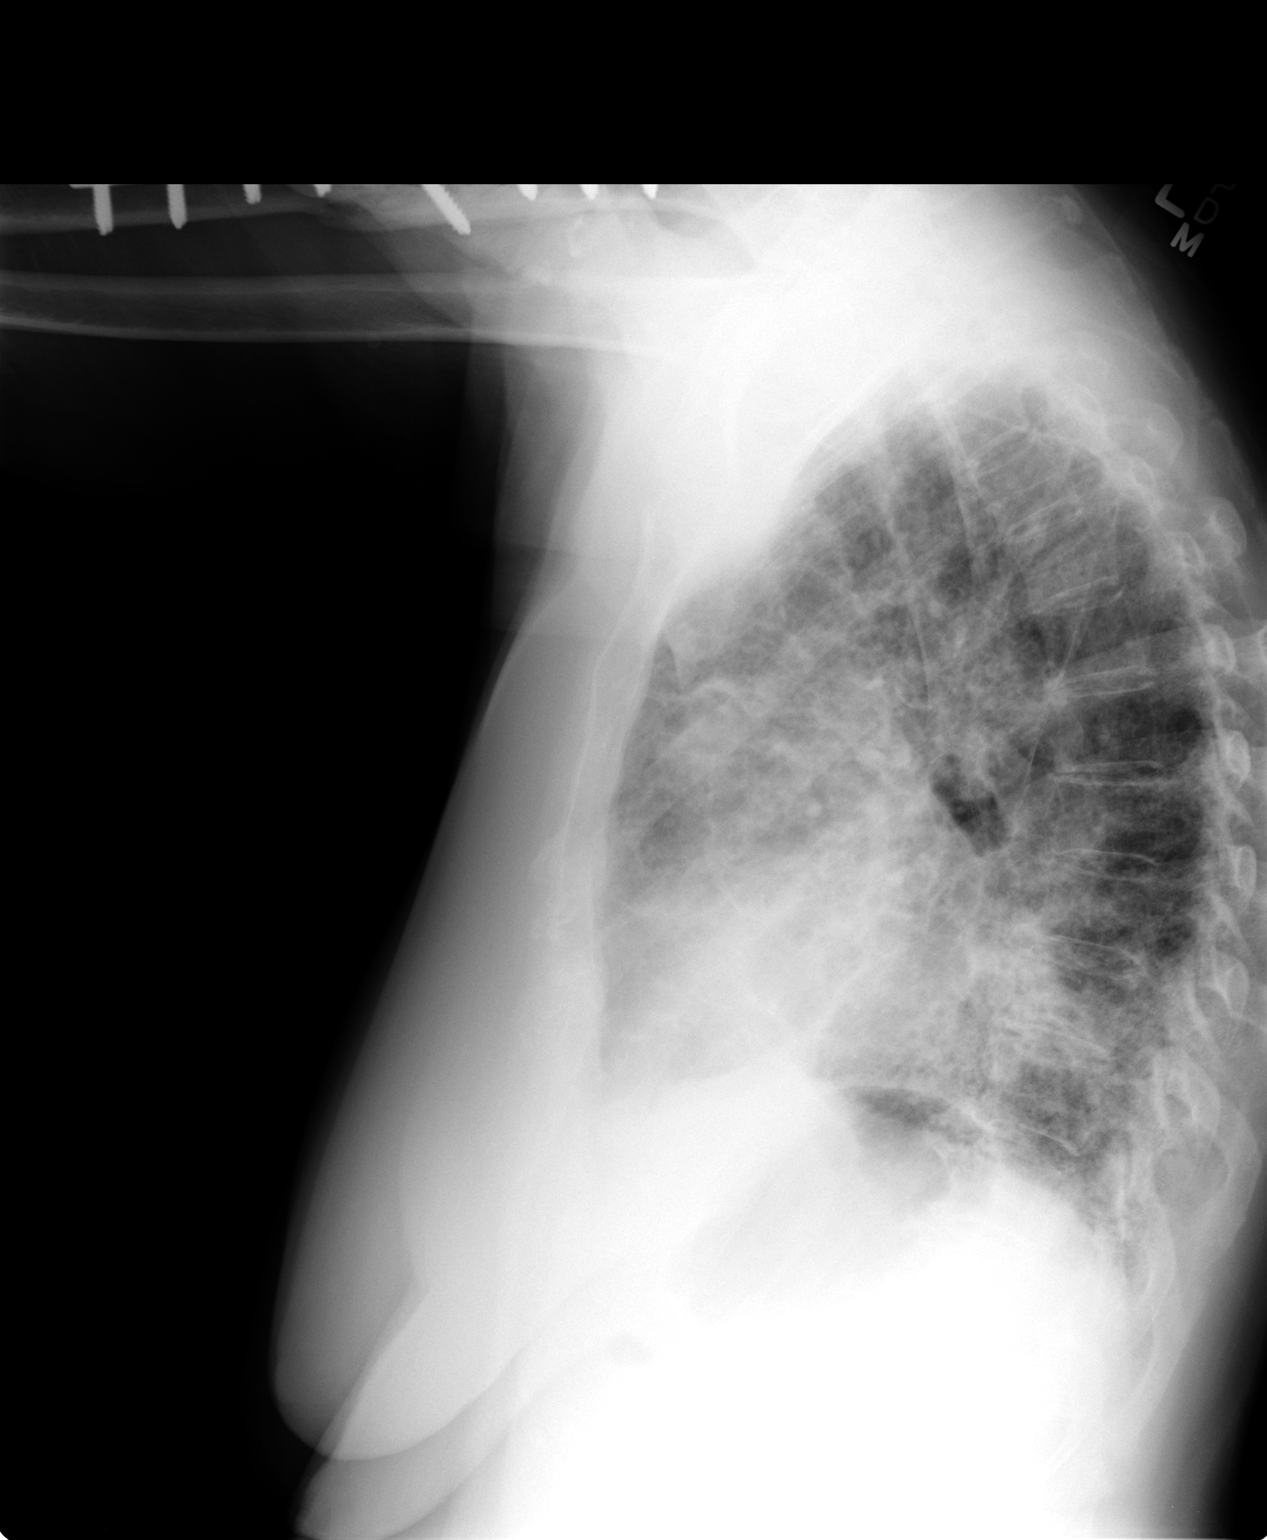

[2 of 2 positions shown; findings below may reference images not displayed]

FINDINGS: Stable lung volumes. Chronic basilar and peripheral increased
interstitial opacity. Superimposed increased perihilar opacity
greater on the left, extending out into the lung periphery
bilaterally. No pneumothorax or pleural effusion. Stable cardiac
size and mediastinal contours. Visualized tracheal air column is
within normal limits. No acute osseous abnormality identified.
Postoperative changes to 1 of the humeri on the lateral view.
IMPRESSION: Chronic pulmonary fibrosis with superimposed acute pulmonary
opacity, favor acute bilateral infectious exacerbation / pneumonia.

## 2014-11-26 ENCOUNTER — Encounter: Payer: Self-pay | Admitting: Internal Medicine

## 2014-11-26 MED ORDER — MIRTAZAPINE 15 MG PO TABS: 15.0000 mg | ORAL_TABLET | Freq: Every day | ORAL | Status: AC

## 2014-11-27 ENCOUNTER — Encounter: Payer: Self-pay | Admitting: Gastroenterology

## 2014-12-07 ENCOUNTER — Encounter: Payer: Self-pay | Admitting: Internal Medicine

## 2014-12-08 ENCOUNTER — Ambulatory Visit: Payer: Medicare PPO | Admitting: Adult Health

## 2014-12-08 ENCOUNTER — Ambulatory Visit (INDEPENDENT_AMBULATORY_CARE_PROVIDER_SITE_OTHER): Admitting: Adult Health

## 2014-12-08 ENCOUNTER — Encounter: Payer: Self-pay | Admitting: Adult Health

## 2014-12-08 VITALS — BP 155/66 | HR 69 | Temp 97.5°F | Ht 60.0 in | Wt 107.0 lb

## 2014-12-08 DIAGNOSIS — I5032 Chronic diastolic (congestive) heart failure: Secondary | ICD-10-CM | POA: Diagnosis not present

## 2014-12-08 DIAGNOSIS — J9611 Chronic respiratory failure with hypoxia: Secondary | ICD-10-CM

## 2014-12-08 DIAGNOSIS — J8410 Pulmonary fibrosis, unspecified: Secondary | ICD-10-CM | POA: Diagnosis not present

## 2014-12-08 MED ORDER — DILTIAZEM HCL ER COATED BEADS 120 MG PO CP24: 120.0000 mg | ORAL_CAPSULE | Freq: Every day | ORAL | Status: AC

## 2014-12-08 NOTE — Assessment & Plan Note (Signed)
Appears compensated   Plan  Continue on lasix to 20 mg M/W/F Continue on Oxygen 3-4 L -continuous flow  Please contact office for sooner follow up if symptoms do not improve or worsen or seek emergency care  follow up Dr. Vassie Loll  In 4 months and As needed

## 2014-12-08 NOTE — Assessment & Plan Note (Signed)
Appears to be progressive IPF on hospice  No flare of cough or increased o2 needs -doubt streoids will help  Can consider rx if wob becomes worse if needed through hospice  Plan  Continue on lasix to 20 mg M/W/F Continue on Oxygen 3-4 L -continuous flow  Please contact office for sooner follow up if symptoms do not improve or worsen or seek emergency care  follow up Dr. Vassie Loll  In 4 months and As needed

## 2014-12-08 NOTE — Progress Notes (Signed)
   Subjective:    Patient ID: Erica Hansen, female    DOB: 02/13/1923, 81 y.o.   MRN: [id number]  HPI  89/F, never smoker for FU of IPF  She was treated with empiric steroids for cough since 06/2008 with good response  She is on O2since 03/2013   Significant tests/ events  PFTs 11/2007 >>FVC 59% - mod restriction with desaturation on exertion.  ANA neg, RA factor neg - esr 71  Pneumovax 2003  03/2008 CT chest >> pattern of fibrosis is most consistent with usual interstitial pneumonia (UIP). Increase in superimposed ground-glass suggests areas of activeinflammation.   11/08/13 ECHO >> nml LV, nml systolic fxn, EF 51-76%, no RWMA. Mild stenosis of AV.   Adm 09/2013  for a community-acquired pneumonia - elevated BNP at 8000.  Adm 12/18-12/21/15 for hypoxia, htn, treated as  Edema Placed on hospice care and moved in with her daughter. Out of facility DO NOT RESUSCITATE .   11m FU - Her respiratory status is stable- although desaturates on Hansen O2 and requires 3-4 L continuous. Hypoxia does appear to be slightly worse  Breathing is doing much better, cough is under control She is tired taking Lasix every day Daughter Erica Hansen is planning to go to the beach for her 89th birthday No fever, chest pain, orthopnea, n/v/d, discolored mucus. Hospice has definitely helped   12/08/2014 Follow up : IPF , chronic O2 3-4 L  Pt returns for 3 month follow up  She remains on Hospice.  She remains on O2 at 3-4 L .  desats easily on pulsing o2 . Gets winded very easily.  Discussed using continuous flow only.  She is accompanied by her daughter, Patient says she does not want to live like this any longer and wants the end to come soon.  She is okay on O2 at rest , but get very weak and winded with walking.  We talked about her IPF and progression.  Talked about meds to help ease wob if necessary -they are going to discuss with hospice and  Call back if decide they need something more.    Denies  any wheezing, chest congestion/tightness, hemoptysis.  sats low on pulsing O2.  On continous 98% on 4l/m   Review of Systems  neg for any significant sore throat, dysphagia, itching, sneezing, nasal congestion or excess/ purulent secretions, fever, chills, sweats, unintended wt loss, pleuritic or exertional cp, hempoptysis, orthopnea pnd or change in chronic leg swelling. Also denies presyncope, palpitations, heartburn, abdominal pain, nausea, vomiting, diarrhea or change in bowel or urinary habits, dysuria,hematuria, rash, arthralgias, visual complaints, headache, numbness weakness or ataxia.     Objective:   Physical Exam  Gen. Pleasant, frail elderly  in no distress, walker  ENT - no lesions, no post nasal drip Neck: No JVD, no thyromegaly, no carotid bruits Lungs: no use of accessory muscles, no dullness to percussion, bibasal rales, no rhonchi  Cardiovascular: Rhythm regular, heart sounds  normal, no murmurs or gallops, 1+peripheral edema Musculoskeletal: No deformities, no cyanosis or clubbing        Assessment & Plan:

## 2014-12-08 NOTE — Assessment & Plan Note (Signed)
Keep sats >88-90%  Use continous flow only

## 2014-12-08 NOTE — Patient Instructions (Signed)
Continue on lasix to 20 mg M/W/F Continue on Oxygen 3-4 L -continuous flow  Please contact office for sooner follow up if symptoms do not improve or worsen or seek emergency care  follow up Dr. Vassie Loll  In 4 months and As needed

## 2014-12-09 NOTE — Telephone Encounter (Signed)
Spoke with daughter and she will wait for Dr. Karle Starch call about scheduling home visit.

## 2014-12-09 NOTE — Progress Notes (Signed)
Reviewed & agree with plan  

## 2014-12-10 ENCOUNTER — Ambulatory Visit: Payer: Medicare PPO | Admitting: Adult Health

## 2014-12-10 ENCOUNTER — Encounter: Payer: Self-pay | Admitting: Internal Medicine

## 2014-12-11 ENCOUNTER — Ambulatory Visit: Payer: Medicare PPO | Admitting: Adult Health

## 2014-12-14 ENCOUNTER — Encounter: Payer: Self-pay | Admitting: Internal Medicine

## 2014-12-15 ENCOUNTER — Telehealth: Payer: Self-pay

## 2014-12-15 ENCOUNTER — Encounter: Payer: Self-pay | Admitting: Pulmonary Disease

## 2014-12-15 NOTE — Telephone Encounter (Signed)
She died around midnight Spoke to daughter and offered my condolences

## 2014-12-15 NOTE — Telephone Encounter (Signed)
PLEASE NOTE: All timestamps contained within this report are represented as [place] Standard Time. CONFIDENTIALTY NOTICE: This fax transmission is intended only for the addressee. It contains information that is legally privileged, confidential or otherwise protected from use or disclosure. If you are not the intended recipient, you are strictly prohibited from reviewing, disclosing, copying using or disseminating any of this information or taking any action in reliance on or regarding this information. If you have received this fax in error, please notify us immediately by telephone so that we can arrange for its return to [place]. Phone: [phone], Toll-Free: [phone], Fax: [phone] Page: 1 of 1 Call Id: [id number] [facility] Night - Client TELEPHONE ADVICE RECORD [facility] Medical Call Center Patient Name: Erica Hansen [facility] Gender: Female DOB: 09/25/1958 Age: 81 Y 2 M 19 D Return Phone Number: Address: City/State/Zip: [place] [facility] Night - Client Client Site [place] [facility] Night Physician [facility] Contact Type Call Call Type Page Only Caller Name Kendal Hymen Is this call to report lab results? No Return Phone Number Please choose phone number Initial Comment [facility] [phone]) is calling to report pt passing. Nurse Assessment Guidelines Guideline Title Affirmed Question Affirmed Notes Nurse Date/Time (Eastern Time) Disp. Time Lamount Cohen Time) Disposition Final User 12/15/2014 12:25:54 AM Send to [facility] Paging Queue Leeroy Cha 12/15/2014 12:36:38 AM Paged On Call back to Call Center - PC [place], Amy 12/15/2014 12:50:16 AM Paged On Call back to Call Center - PC Antonieta Iba 12/15/2014 12:52:28 AM Page Completed Yes Antonieta Iba After Care Instructions Given Call Event Type User Date / Time Description Paging [facility] Phone DateTime Result/Outcome Message Type Notes Kriste Basque  [phone] 12/15/2014 12:36:38 AM Paged On Call Back to Call Center Doctor Paged Please call Amy @ [facility]. [phone] Kriste Basque [phone] 12/15/2014 12:50:16 AM Paged On Call Back to Call Center Doctor Paged Please call Carollee Herter @ [facility] at [phone]. Kriste Basque 12/15/2014 12:50:25 AM Spoke with On Call - General Message Result

## 2014-12-15 NOTE — Telephone Encounter (Signed)
PLEASE NOTE: All timestamps contained within this report are represented as [place] Standard Time. CONFIDENTIALTY NOTICE: This fax transmission is intended only for the addressee. It contains information that is legally privileged, confidential or otherwise protected from use or disclosure. If you are not the intended recipient, you are strictly prohibited from reviewing, disclosing, copying using or disseminating any of this information or taking any action in reliance on or regarding this information. If you have received this fax in error, please notify us immediately by telephone so that we can arrange for its return to [place]. Phone: [phone], Toll-Free: [phone], Fax: [phone] Page: 1 of 2 Call Id: [id number] [place] [facility] Night - Client TELEPHONE ADVICE RECORD [facility] Medical Call Center Patient Name: Erica Hansen Gender: Female DOB: [birth year] Age: 81 Y 2 M 18 D Return Phone Number: Address: City/State/Zip: [place] Client [place] [facility] Night - Client Client Site [place] [facility] [place] - Night Physician [facility] Contact Type Call Call Type Triage / Clinical Relationship To Patient Provider Return Phone Number Please choose phone number Chief Complaint BREATHING - shortness of breath or sounds breathless Initial Comment Caller states she is Morrie Sheldon with [facility]. She has PT [name] [birth year]. PT is short of breath. CB# [phone] Nurse Assessment Nurse: Harlon Flor, RN, Darl Pikes Date/Time (Eastern Time): Jan 13, 2015 5:28:25 PM Confirm and document reason for call. If symptomatic, describe symptoms. 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 She has PT [name] 09/[birth year]/24. PT is short of breath. CB# Son called Hospice after he noted the pt fell and he was there and eased her to the floor. DX for hospice : Pulmonary fibrosis CHF Resp flre hypoxia Type 2 Diabetes anxiety. Nurse went to check her .  no fever to touch. O2 sats 37-44 142/60 HR 84 RR 28 short shallow keeps saying "she is ready to go ". son gave dose of Morphine 20mg  / ML and she fell at 3pm and nurse arrived at 3:40pm at 4pm nurse gave 0.76ml Morphine iffy about using 0.25mg  Lorazepine she gave her a neb treatment and no change she gave 0.25ML Morphine at 5pm still same so she gave 0.5 ML morphine and also the other 1/2 lorazepam tab and no real difference but pt states she feels better. Son was given instruction on disease process and the sister is away at the beach. He started to call 911. she is advised to give the whole 0.5 mg lorazepam Q 4hrs and nebs tx Q 4hrs she can only increase to 0.5 ML morphine and no increase per current orders. but with MD orders can increase. Has the patient traveled out of the country within the last 30 days? ---Not Applicable Does the patient require triage? ---Declined Triage Please document clinical information provided and list any resource used. ---RN will page oncall MD for Hospice nurse Guidelines Guideline Title Affirmed Question Affirmed Notes Nurse Date/Time Lamount Cohen Time) Disp. Time Lamount Cohen Time) Disposition Final User 13-Jan-2015 5:21:48 PM Send to Urgent Queue Meryle Ready 01/13/2015 5:36:28 PM Paged On Call back to Call Center [place], RN, Darl Pikes PLEASE NOTE: All timestamps contained within this report are represented as [place] Standard Time. CONFIDENTIALTY NOTICE: This fax transmission is intended only for the addressee. It contains information that is legally privileged, confidential or otherwise protected from use or disclosure. If you are not the intended recipient, you are strictly prohibited from reviewing, disclosing, copying using or disseminating any of this information or taking any action in reliance on  or regarding this information. If you have received this fax in error, please notify us immediately by telephone so that we can arrange for its return to  [place]. Phone: [phone], Toll-Free: [phone], Fax: [phone] Page: 2 of 2 Call Id: [id number] 2014/12/26 5:45:09 PM Clinical Call Yes Harlon Flor, RN, Darl Pikes After Care Instructions Given Call Event Type User Date / Time Description Comments User: Sabino Snipes, RN Date/Time Lamount Cohen Time): 12/26/2014 5:44:35 PM addendum pt is on 4LPM BNC . Per Morrie Sheldon when she went in to recheck and pt breathing is better and O2 sats in 50%. Paging DoctorName Phone DateTime Result/Outcome Message Type Notes Kriste Basque [phone] 12/26/2014 5:36:28 PM Paged On Call Back to Call Center Doctor Paged Elpidio Galea [phone] Kriste Basque 12/26/14 5:41:53 PM Spoke with On Call - General Message Result Please ask nurse to call the Medical Director for Hospice MD does not feel comfortable to change any orders.

## 2014-12-15 NOTE — Telephone Encounter (Signed)
Called to pass on condolences to her daugher Olegario Messier

## 2014-12-15 NOTE — Telephone Encounter (Signed)
The last note from [facility] had DOB 09/25/1958 instead of April 12, 1923; think this is this pt.

## 2014-12-15 DEATH — deceased

## 2014-12-16 ENCOUNTER — Other Ambulatory Visit: Payer: Self-pay | Admitting: Internal Medicine

## 2014-12-16 NOTE — Telephone Encounter (Signed)
Sent 12/08/2014 

## 2015-01-02 ENCOUNTER — Ambulatory Visit: Payer: Medicare PPO | Admitting: Internal Medicine

## 2015-06-09 IMAGING — CR DG CHEST 2V
2 series · 2 of 2 positions shown · non-contrast
Comparison: Prior chest x-ray 10/05/2013

CLINICAL DATA: Pneumonia, cough and shortness of breath

EXAM:
CHEST  2 VIEW

[w chest lat]
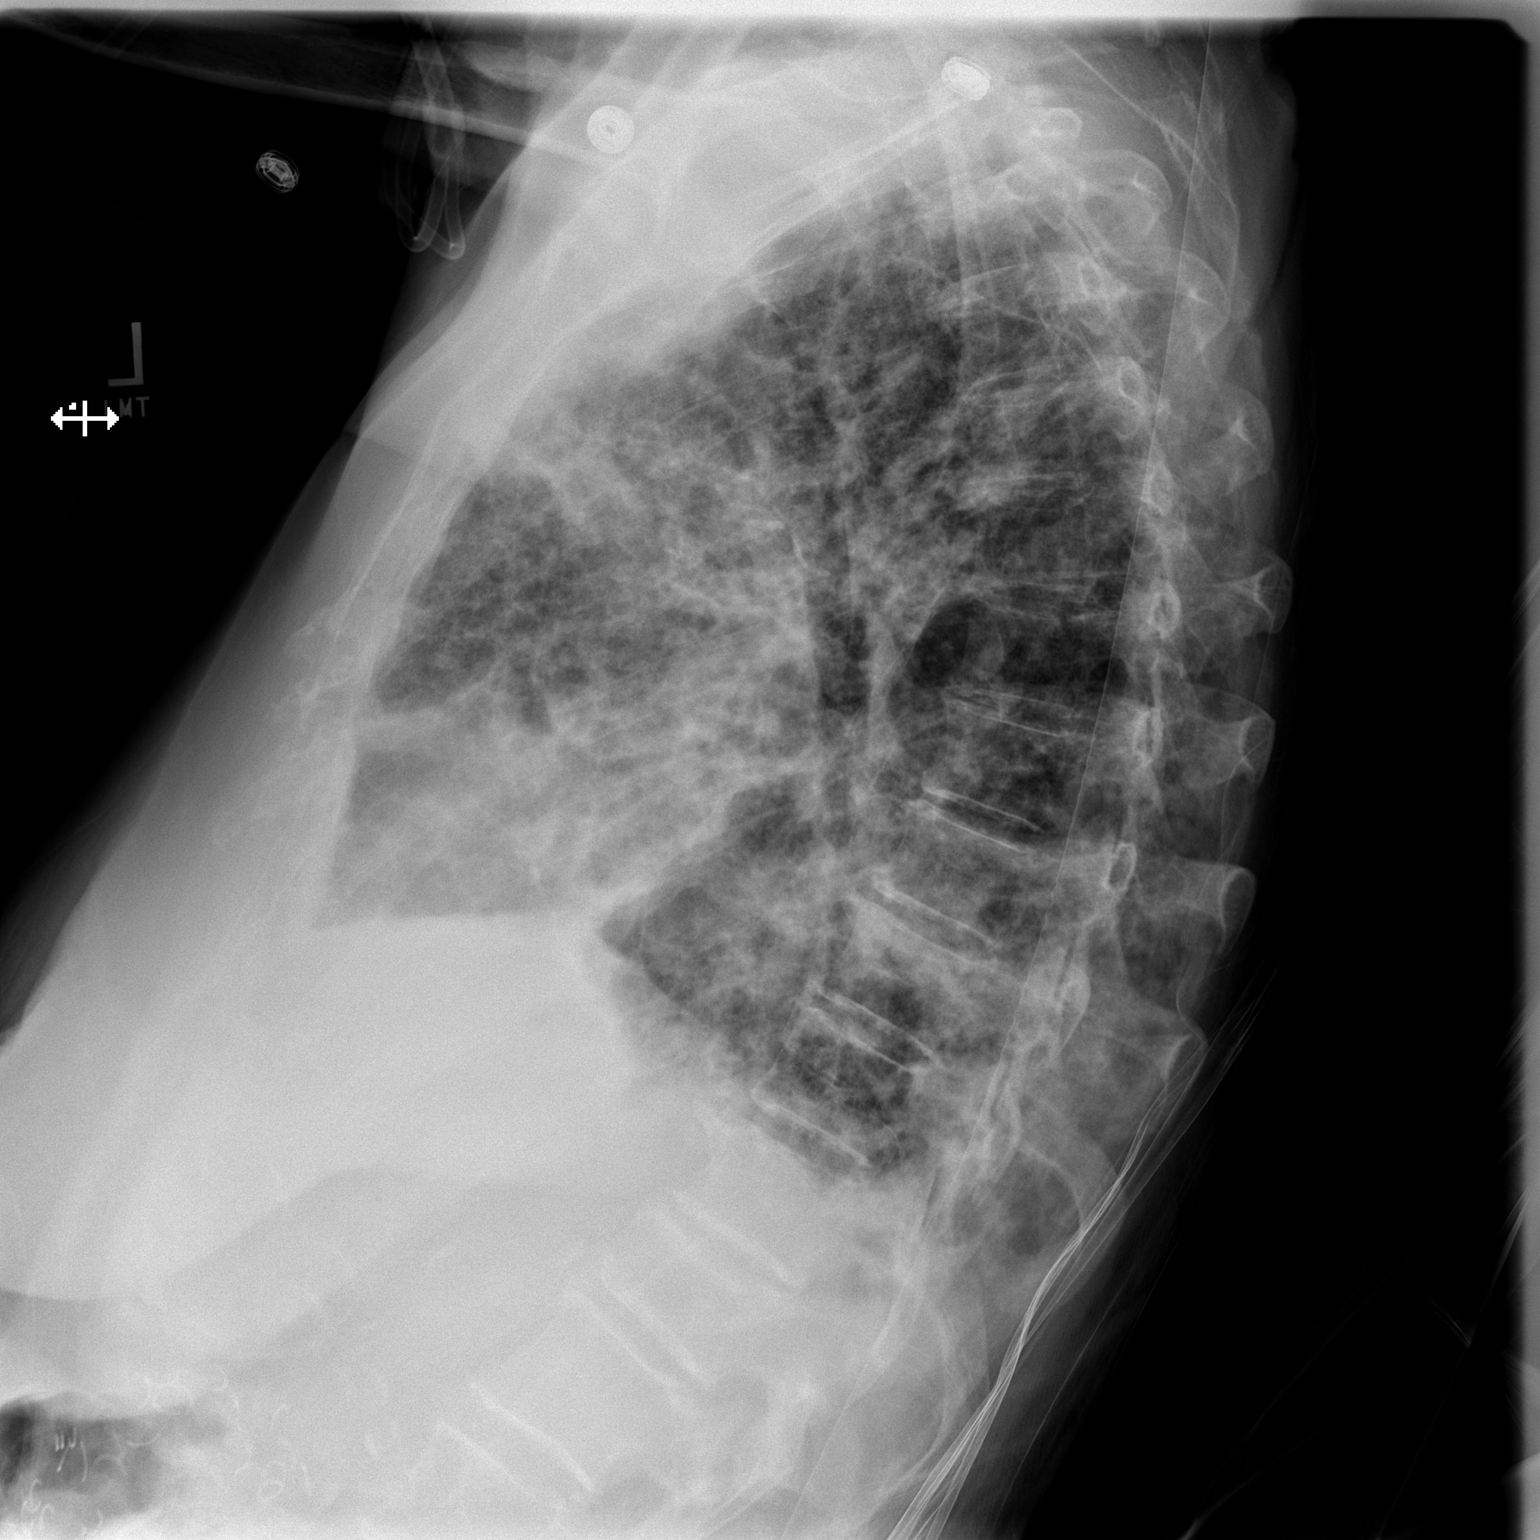

[x chest ap]
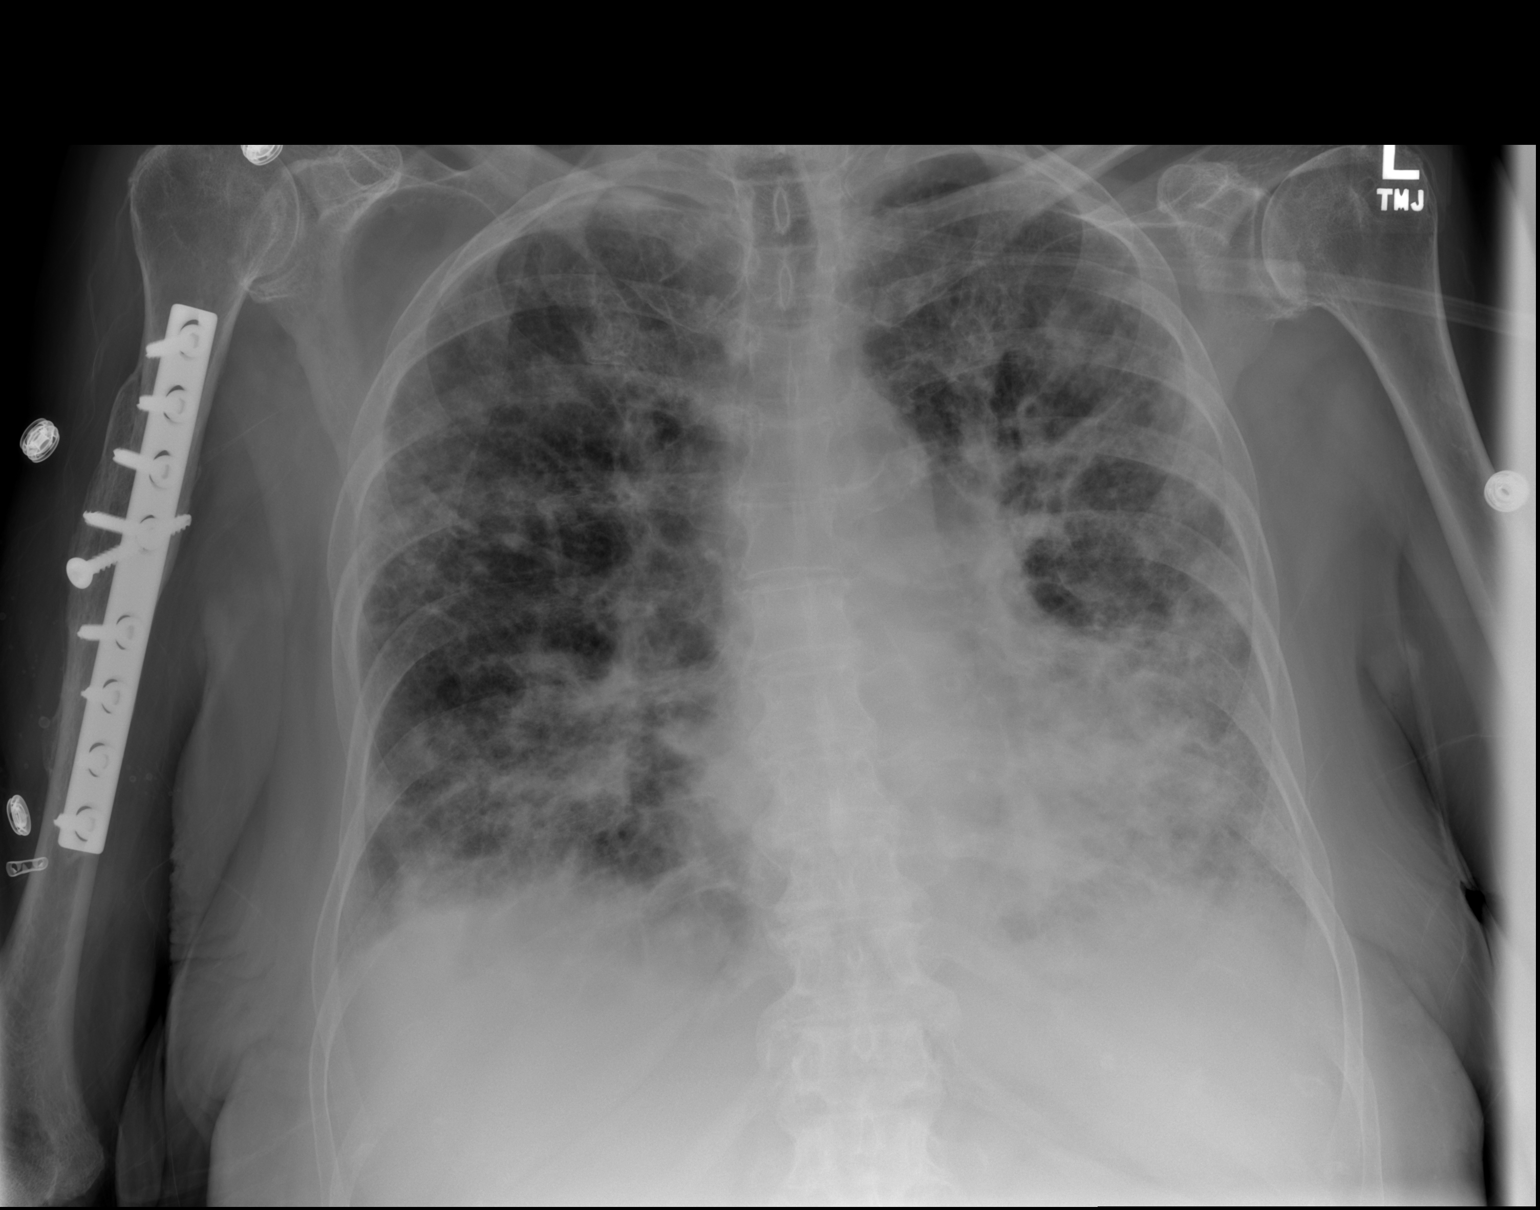

[2 of 2 positions shown; findings below may reference images not displayed]

FINDINGS: Interval progression of patchy interstitial and airspace opacities
in the right mid lung. The cardiac and mediastinal contours remain
unchanged. Atherosclerotic calcifications are still present in the
transverse aorta. No pneumothorax or new effusion. No acute osseous
abnormality.
IMPRESSION: Interval progression of multi focal interstitial and airspace
opacities in the right mid lung concerning for progression of
multifocal pneumonia/pneumonitis.

There may be a slight interval progression of airspace disease in
the left suprahilar and left lower lung regions as well.

Underlying chronic lung changes consistent with interstitial
fibrosis.

## 2016-01-09 ENCOUNTER — Encounter: Payer: Self-pay | Admitting: Internal Medicine

## 2016-01-11 NOTE — Telephone Encounter (Signed)
Please change her status to Deceased.
